# Patient Record
Sex: Female | Born: 1965 | Race: Black or African American | Hispanic: No | Marital: Single | Smoking: Never smoker
Health system: Southern US, Community
[De-identification: ages and names within clinical notes are randomized; demographics above are authoritative.]

## PROBLEM LIST (undated history)

## (undated) DIAGNOSIS — D219 Benign neoplasm of connective and other soft tissue, unspecified: Secondary | ICD-10-CM

## (undated) DIAGNOSIS — I10 Essential (primary) hypertension: Secondary | ICD-10-CM

## (undated) DIAGNOSIS — R519 Headache, unspecified: Secondary | ICD-10-CM

## (undated) DIAGNOSIS — K219 Gastro-esophageal reflux disease without esophagitis: Secondary | ICD-10-CM

## (undated) DIAGNOSIS — F319 Bipolar disorder, unspecified: Secondary | ICD-10-CM

## (undated) DIAGNOSIS — D649 Anemia, unspecified: Secondary | ICD-10-CM

## (undated) DIAGNOSIS — L309 Dermatitis, unspecified: Secondary | ICD-10-CM

## (undated) DIAGNOSIS — I729 Aneurysm of unspecified site: Secondary | ICD-10-CM

## (undated) DIAGNOSIS — G4700 Insomnia, unspecified: Secondary | ICD-10-CM

## (undated) DIAGNOSIS — G4730 Sleep apnea, unspecified: Secondary | ICD-10-CM

## (undated) HISTORY — PX: COLONOSCOPY: SHX174

## (undated) HISTORY — PX: UTERINE FIBROID EMBOLIZATION: SHX825

## (undated) HISTORY — PX: MYOMECTOMY: SHX85

## (undated) HISTORY — DX: Benign neoplasm of connective and other soft tissue, unspecified: D21.9

## (undated) HISTORY — DX: Essential (primary) hypertension: I10

## (undated) HISTORY — DX: Aneurysm of unspecified site: I72.9

## (undated) HISTORY — DX: Dermatitis, unspecified: L30.9

## (undated) HISTORY — DX: Headache, unspecified: R51.9

## (undated) HISTORY — DX: Gastro-esophageal reflux disease without esophagitis: K21.9

## (undated) HISTORY — DX: Insomnia, unspecified: G47.00

## (undated) HISTORY — DX: Bipolar disorder, unspecified: F31.9

---

## 2002-09-09 ENCOUNTER — Emergency Department: Admit: 2002-09-09 | Payer: Self-pay | Source: Emergency Department | Admitting: Emergency Medicine

## 2011-07-06 ENCOUNTER — Ambulatory Visit (INDEPENDENT_AMBULATORY_CARE_PROVIDER_SITE_OTHER): Payer: Medicare Other

## 2011-07-06 DIAGNOSIS — Z111 Encounter for screening for respiratory tuberculosis: Secondary | ICD-10-CM

## 2011-07-06 NOTE — Progress Notes (Signed)
PPD Placement note  Christina Torres, 45 y.o. female is here today for placement of PPD test  Reason for PPD test: Job she is applying for requires.  Pt taken PPD test before: yes  Verified in allergy area and with patient that they are not allergic to the products PPD is made of (Phenol or Tween). Yes4  Has the patient ever received the BCG vaccine?: no  O: Alert and oriented in NAD.  P:  PPD placed on 07/06/2011.  Patient advised to return for reading within 48-72 hours.

## 2011-07-08 ENCOUNTER — Ambulatory Visit (INDEPENDENT_AMBULATORY_CARE_PROVIDER_SITE_OTHER): Payer: Medicare Other

## 2011-07-08 DIAGNOSIS — Z111 Encounter for screening for respiratory tuberculosis: Secondary | ICD-10-CM

## 2011-07-08 LAB — TB SKIN TEST
Induration: 0
TB Skin Test: NEGATIVE mm

## 2011-07-08 NOTE — Progress Notes (Signed)
PPD Reading Note  PPD read and results entered in Epic[employer].  Result: 0 mm induration.  Interpretation: negative  Allergic reaction: no

## 2014-04-30 ENCOUNTER — Encounter (INDEPENDENT_AMBULATORY_CARE_PROVIDER_SITE_OTHER): Payer: Self-pay | Admitting: Internal Medicine

## 2014-04-30 ENCOUNTER — Ambulatory Visit (INDEPENDENT_AMBULATORY_CARE_PROVIDER_SITE_OTHER): Payer: Medicare Other | Admitting: Internal Medicine

## 2014-04-30 VITALS — BP 137/85 | HR 97 | Temp 98.9°F | Ht 63.98 in | Wt 180.8 lb

## 2014-04-30 DIAGNOSIS — F319 Bipolar disorder, unspecified: Secondary | ICD-10-CM

## 2014-04-30 DIAGNOSIS — I10 Essential (primary) hypertension: Secondary | ICD-10-CM | POA: Insufficient documentation

## 2014-04-30 DIAGNOSIS — Z1331 Encounter for screening for depression: Secondary | ICD-10-CM

## 2014-04-30 DIAGNOSIS — D500 Iron deficiency anemia secondary to blood loss (chronic): Secondary | ICD-10-CM | POA: Insufficient documentation

## 2014-04-30 NOTE — Patient Instructions (Addendum)
Controlling High Blood Pressure  High blood pressure (hypertension) is called the silent killer. This is because many people who have it don't know it. Normal blood pressure is less than 120/80. Know your blood pressure and remember to check it regularly. Doing so can save your life. Here are some things you can do to help control your blood pressure.    Choose heart-healthy foods   Select low-salt, low-fat foods.   Limit canned, dried, cured, packaged, and fast foods. These can contain a lot of salt.   Eat 8-10 servings of fruits and vegetables every day.   Choose lean meats, fish, or chicken.   Eat whole-grain pasta, brown rice, and beans.   Eat 2-3 servings of low-fat or fat-free dairy products   Ask your doctor about the DASH eating plan. This plan helps reduce blood pressure.  Maintain a healthy weight   Ask your healthcare provider how many calories to eat a day. Then stick to that number.   Ask your healthcare provider what weight range is healthiest for you. If you are overweight, weight loss of only 10 lbs can help lower blood pressure.   Limit snacks and sweets.   Get regular exercise.  Get up and get active   Choose activities you enjoy. Find ones you can do with friends or family.   Park farther away from building entrances.   Use stairs instead of the elevator.   When you can, walk or bike instead of driving.   Rake leaves, garden, or do household repairs.   Be active for at least 30 minutes a day, most days of the week.  Manage stress   Make time to relax and enjoy life. Find time to laugh.   Visit with family and friends, and keep up with hobbies.  Limit alcohol and quit smoking   Men: Have no more than 2 drinks per day.   Women: Have no more than 1 drink per day.   Talk with your healthcare provider about quitting smoking. Smoking increases your risk for heart disease and stroke. Ask about local or community programs that can help.  Medications  If lifestyle changes aren't  enough, your healthcare provider may prescribe high blood pressure medicine. Take all medications as prescribed.    2000-2014 [employer] [address], [place] [address]. All rights reserved. This information is not intended as a substitute for professional medical care. Always follow your healthcare professional's instructions.

## 2014-04-30 NOTE — Progress Notes (Signed)
Subjective:      Patient ID: Christina Torres is a 48 y.o. female  Chief Complaint   Patient presents with   . Establish Care   . Hypertension        HPI  Problem   Essential Hypertension    Accidentally threw away bp medication. During that time went to [facility] for 7 pills. Before that went to ER and bp was high and HCTZ to 25mg . Today took 12.5mg . Sometimes checks it at home. Last checked it and it was 200/100 and that's what took her to the ER. Didn't take it since then.   Feels well currently.   Diagnosed 2.5 yrs ago. At the time said it was mild. Does have family hx of HTN. Has 2 pills left.      Bipolar 1 Disorder    Sees psych Dr. Theda Belfast, [facility] who manages medication. Trileptal 600 qam, 900 qpm, Seroquel qpm, Sapris qhs prn sleep.       Iron Deficiency Anemia Due to Chronic Blood Loss    On iron supplements. Has hx of fibroids. Always is low.        The following portions of the patient's history were reviewed and updated as appropriate: current medications, allergies, past family history, past medical history, past social history, past surgical history and problem list.     Review of Systems   Constitutional: Negative.    Respiratory: Negative.    Cardiovascular: Negative.         BP 150/82 mmHg  Pulse 116  Temp(Src) 98.9 F (37.2 C) (Oral)  Ht 1.625 m (5' 3.98")  Wt 82.01 kg (180 lb 12.8 oz)  BMI 31.06 kg/m2  LMP 04/16/2014 (Approximate)   Objective:   Physical Exam   Constitutional: She is oriented to person, place, and time. She appears well-developed and well-nourished. No distress.   Eyes: Conjunctivae are normal.   Cardiovascular: Normal rate, regular rhythm and normal heart sounds.    Pulmonary/Chest: Effort normal and breath sounds normal.   Neurological: She is alert and oriented to person, place, and time.   Psychiatric: She has a normal mood and affect.   Nursing note and vitals reviewed.      Assessment:     1. Essential hypertension     2. Bipolar 1 disorder      3. Screening for depression     4. Iron deficiency anemia due to chronic blood loss           Plan:     48 y.o. female presents for the following:    1. HTN: usually well controlled  -continue HCTZ  -care plan letter at next visit    2. Bipolar 1: well controlled  -continue current meds   -follow up with psych as scheduled    3. Iron deficiency anemia  -check CBC at next visit    Return in about 4 weeks (around 05/28/2014) for follow up bp.     Gust Brooms, MD

## 2014-05-02 NOTE — Assessment & Plan Note (Signed)
Relevant Hx:  Course:  Daily Update:  Today's Plan:

## 2014-06-02 ENCOUNTER — Ambulatory Visit (INDEPENDENT_AMBULATORY_CARE_PROVIDER_SITE_OTHER): Payer: Medicare Other | Admitting: Internal Medicine

## 2014-06-02 ENCOUNTER — Encounter (INDEPENDENT_AMBULATORY_CARE_PROVIDER_SITE_OTHER): Payer: Self-pay | Admitting: Internal Medicine

## 2014-06-02 VITALS — BP 138/84 | HR 105 | Temp 98.8°F | Ht 64.49 in | Wt 183.0 lb

## 2014-06-02 DIAGNOSIS — Z1239 Encounter for other screening for malignant neoplasm of breast: Secondary | ICD-10-CM

## 2014-06-02 DIAGNOSIS — Z23 Encounter for immunization: Secondary | ICD-10-CM

## 2014-06-02 DIAGNOSIS — D500 Iron deficiency anemia secondary to blood loss (chronic): Secondary | ICD-10-CM

## 2014-06-02 DIAGNOSIS — I10 Essential (primary) hypertension: Secondary | ICD-10-CM

## 2014-06-02 DIAGNOSIS — Z1211 Encounter for screening for malignant neoplasm of colon: Secondary | ICD-10-CM

## 2014-06-02 NOTE — Progress Notes (Signed)
1. Have you self referred yourself since we last saw you? No  Refer to care team   Or   Add specialists:

## 2014-06-02 NOTE — Progress Notes (Signed)
Subjective:      Patient ID: Christina Torres is a 48 y.o. female  Chief Complaint   Patient presents with   . Hypertension     Patient would like referral for mammo and colonoscopy.        Hypertension      Problem   Essential Hypertension    Diagnosed 2.5 yrs ago. At the time said it was mild. Does have family hx of HTN. Taking HCTZ regularly, no concerns. Hasn't check bp at home recently. Feels well currently. No concerns except bp is a little high today in the office.     BP Readings from Last 3 Encounters:   06/02/14 146/83   04/30/14 137/85            Iron Deficiency Anemia Due to Chronic Blood Loss    On iron supplements. Has hx of fibroids. Always is low. Does not wish to check labs today.          The following portions of the patient's history were reviewed and updated as appropriate: current medications, allergies, past family history, past medical history, past social history, past surgical history and problem list.     Review of Systems   Constitutional: Negative.    Respiratory: Negative.    Cardiovascular: Negative.         BP 146/83-->138/84 mmHg  Pulse 101  Temp(Src) 98.8 F (37.1 C) (Oral)  Ht 1.638 m (5' 4.49")  Wt 83.008 kg (183 lb)  BMI 30.94 kg/m2  SpO2 98%  LMP 05/06/2014   Objective:   Physical Exam   Constitutional: She is oriented to person, place, and time. She appears well-developed and well-nourished. No distress.   Eyes: Conjunctivae are normal.   Cardiovascular: Normal rate, regular rhythm and normal heart sounds.    Pulmonary/Chest: Effort normal and breath sounds normal.   Neurological: She is alert and oriented to person, place, and time.   Psychiatric: She has a normal mood and affect.   Nursing note and vitals reviewed.      Assessment:     1. Essential hypertension     2. Breast cancer screening  Mammo Digital Screening Bilateral W Cad   3. Special screening for malignant neoplasms, colon  Ambulatory referral to Gastroenterology   4. Need for influenza vaccination  Flu vaccine  QUAD 3 yrs & greater PRESERVED IM   5. Iron deficiency anemia due to chronic blood loss           Plan:     48 y.o. female presents for the following:    1. HTN: usually well controlled, improved on rechecks  -continue HCTZ  -care plan letter at next visit  -check bp at home and keep log, bring log and cuff to next visit.     2. Bipolar 1: well controlled  -continue current meds   -follow up with psych as scheduled    3. Iron deficiency anemia  -check CBC at next visit    Health maintenance:  -Fasting blood work: at next visit  -Immunizations: flu today, discuss tdap at future bisit  -STD screen: discuss at future visit  -Pap: UTD, due 2018  -Mammogram:ordered  -DEXA: due at 50 yo  -Colonoscopy: referral given  -Vision: discuss at future visit  -Dental: discuss at future visit  -Advanced directive: discuss at future visit    Return in about 3 months (around 09/02/2014) for follow up bp.     Gust Brooms, MD

## 2014-06-16 ENCOUNTER — Ambulatory Visit: Payer: Medicare Other

## 2014-06-16 ENCOUNTER — Ambulatory Visit: Payer: Medicare Other | Attending: Internal Medicine

## 2014-06-16 ENCOUNTER — Other Ambulatory Visit (INDEPENDENT_AMBULATORY_CARE_PROVIDER_SITE_OTHER): Payer: Self-pay | Admitting: Internal Medicine

## 2014-06-16 DIAGNOSIS — Z1239 Encounter for other screening for malignant neoplasm of breast: Secondary | ICD-10-CM

## 2014-06-16 DIAGNOSIS — Z1231 Encounter for screening mammogram for malignant neoplasm of breast: Secondary | ICD-10-CM | POA: Insufficient documentation

## 2014-10-06 ENCOUNTER — Telehealth (INDEPENDENT_AMBULATORY_CARE_PROVIDER_SITE_OTHER): Payer: Self-pay | Admitting: Internal Medicine

## 2014-10-06 DIAGNOSIS — Z1211 Encounter for screening for malignant neoplasm of colon: Secondary | ICD-10-CM

## 2014-10-06 NOTE — Telephone Encounter (Signed)
Order placed as requested.

## 2014-10-06 NOTE — Telephone Encounter (Signed)
-----   Message from Chinita Greenland sent at 10/06/2014  8:49 AM EST -----  Regarding: Gastro referral   Good Morning-    Patient has current referral for [facility] for colonoscopy.  Patient called because that location does not accept her insurance, and would like a generic referral for colonoscopy so she can take it to a new location.     Thanks!  - Randolph Bing

## 2014-10-09 ENCOUNTER — Encounter (INDEPENDENT_AMBULATORY_CARE_PROVIDER_SITE_OTHER): Payer: Self-pay | Admitting: Internal Medicine

## 2014-10-09 ENCOUNTER — Ambulatory Visit (INDEPENDENT_AMBULATORY_CARE_PROVIDER_SITE_OTHER): Payer: No Typology Code available for payment source | Admitting: Internal Medicine

## 2014-10-09 VITALS — BP 110/69 | HR 92 | Temp 98.2°F | Ht 64.0 in | Wt 186.0 lb

## 2014-10-09 DIAGNOSIS — R110 Nausea: Secondary | ICD-10-CM

## 2014-10-09 MED ORDER — ONDANSETRON 4 MG PO TBDP
4.0000 mg | ORAL_TABLET | Freq: Three times a day (TID) | ORAL | Status: DC | PRN
Start: 2014-10-09 — End: 2015-03-13

## 2014-10-09 NOTE — Patient Instructions (Signed)
Viral Gastroenteritis (Adult)    Gastroenteritis is another name for the stomach flu. It is most often caused by a virus that affects the stomach and intestinal tract and usually lasts from 2 to 7 days.  The danger from repeated vomiting or diarrhea is dehydration. This is the loss of too many fluids from the body. When this occurs, body fluids must be replaced. Antibiotics do not help with this illness, but simple home treatment will be helpful.  Symptoms of viral gastroenteritis may include:   Watery, loose stools   Stomach pain or abdominal cramps   Fever and chills   Nausea and vomiting   Loss of bowel control   Headache  Home care  Follow these guidelines when caring for yourself at home:   If symptoms are severe, rest at home for the next 24 hours or until you are feeling better.   Washing your hands with soap and water is the best way to prevent the spread of infection. Wash your hands after touching anyone who is sick.   Wash your hands after using the toilet and before meals. Clean the toilet after each use.  Remember these tips when preparing food:   People with diarrhea should not prepare food for others. When preparing foods, wash your hands before and after.   Wash your hands after using cutting boards, countertops, and knives that have been in contact with raw food.   Keep uncooked meats away from cooked and ready-to-eat foods.  Medications  You may use acetaminophen or ibuprofen to control fever unless another medicine was given. If you have chronic liver or kidney disease, talk with your health care provider before using these medicines. Also talk with your provider if you'vehad a stomach ulcer or GI bleeding. Don't give aspirinto anyone under 18 years of age who is ill with a fever. It may cause severe liver damage.  Diet  Follow these guidelines forfood:   Water and liquids are important so you don't get dehydrated. Drink a small amount at a time or suck on ice chips if vomiting  is present.   If you eat, avoid fatty, greasy, spicy, or fried foods.   Don't eat dairy if you have diarrhea. This can make diarrhea worse.  During the first 24 hours (the first full day), follow the diet below:   Beverages. Sports drinks, soft drinks without caffeine, ginger ale, mineral water (plain or flavored), decaffeinated tea and coffee.   Soups. Clear broth, consomm, and bouillon.   Desserts. Gelatin, popsicles, and fruit juice bars.  During the next 24 hours (the second day), you may add the following to the above:   Hot cereal, plain toast, bread, rolls, and crackers   Plain noodles, rice, mashed potatoes, chicken noodle or rice soup   Unsweetened canned fruit (avoid pineapple), bananas   Limit fat intake to less than 15 grams per day. Do this by avoiding margarine, butter, oils, mayonnaise, sauces, gravies, fried foods, peanut butter, meat, poultry, and fish.   Limit fiber and avoid raw or cooked vegetables, fresh fruits (except bananas), and bran cereals.   Limit caffeine and chocolate. Don't use spices or seasonings other than salt.  During the next 24 hours:   Gradually resume a normal diet as you feel better and your symptoms improve.   If at any time it starts getting worse again, go back to clear liquids until you feel better.  Follow-up care  Follow up with your health care provider, oras advised. Call   your provider if you don't get better within 24 hours or if diarrhea lasts more than a week. If a stool (diarrhea) sample was taken, call as directed for the results.  Call 911  Call 911 if any of these occur:   Trouble breathing   Confused   Severe drowsiness or trouble awakening   Fainting or loss of consciousness   Rapid heart rate   Seizure   Stiff neck  When to seek medical advice  Call your health care provider right away if any of these occur:   Abdominal pain that gets worse   Continued vomiting (unable to keep liquids down)   Frequent diarrhea (more than 5 times a  day)   Blood in vomit or stool (black or red color)   Dark urine, reduced urine output, or extreme thirst   Weakness or dizziness   Drowsiness   Fever of 100.4F (38C) oral or higher that does not get better with fever medication   New rash   2000-2015 [employer]. [address], [place] [address]. All rights reserved. This information is not intended as a substitute for professional medical care. Always follow your healthcare professional's instructions.

## 2014-10-09 NOTE — Progress Notes (Signed)
Subjective:      Patient ID: Christina Torres is a 49 y.o. female  Chief Complaint   Patient presents with   . Nausea     felt weakness like she wsa sick   . Headache        Emesis   This is a new problem. The current episode started yesterday. The problem has been unchanged. There has been no fever. Pertinent negatives include no abdominal pain, coughing, diarrhea, fever or URI. She has tried nothing for the symptoms.     Yesterday had a headache that lasted 1-2 hours and went away. Last night around 1045 had   Then body started aching and felt sick to her stomach. Then today still feeling that way and feeling weak. No more headache since yesterday. Maybe is getting one now. Yesterday's was different than the ones she gets when she eats poorly. No vomiting.   Thought she was getting a sore throat this morning, but it went away.   No known sick contacts. Ate out at [facility] and had wings last night and second day she ate there.   Started period today, but never feels like this when she has a period so doesn't think it's related.     The following portions of the patient's history were reviewed and updated as appropriate: current medications, allergies, past family history, past medical history, past social history, past surgical history and problem list.     Review of Systems   Constitutional: Positive for fatigue. Negative for fever.   HENT: Positive for sore throat. Negative for congestion and rhinorrhea.    Respiratory: Negative for cough.    Gastrointestinal: Positive for nausea. Negative for vomiting, abdominal pain, diarrhea and constipation.   Genitourinary: Negative for dysuria.   Neurological: Positive for weakness (generalized today, yesterday felt strong and energetic).        BP 110/69 mmHg  Pulse 92  Temp(Src) 98.2 F (36.8 C)  Ht 1.626 m (5\' 4" )  Wt 84.369 kg (186 lb)  BMI 31.91 kg/m2  SpO2 97%   Objective:   Physical Exam   Constitutional: She is oriented to person, place, and time. She appears  well-developed and well-nourished. No distress.   HENT:   Head: Normocephalic and atraumatic.   Right Ear: Tympanic membrane normal.   Left Ear: Tympanic membrane normal.   Nose: Mucosal edema present. No rhinorrhea.   Mouth/Throat: Oropharynx is clear and moist and mucous membranes are normal.   Eyes: Conjunctivae are normal.   Neck: Neck supple.   Cardiovascular: Normal rate, regular rhythm and normal heart sounds.    Pulmonary/Chest: Effort normal and breath sounds normal.   Abdominal: Soft. Bowel sounds are normal. She exhibits no distension and no mass. There is no tenderness. There is no rebound and no guarding.   Lymphadenopathy:     She has no cervical adenopathy.   Neurological: She is alert and oriented to person, place, and time.   Psychiatric: She has a normal mood and affect.   Nursing note and vitals reviewed.      Assessment:     1. Nausea  ondansetron (ZOFRAN ODT) 4 MG disintegrating tablet         Plan:     49 y.o. female presents with fatigue, nausea, and body aches. No fever or URI symptoms at this time. Unlikely to be influenza. Could be viral gastroenteritis vs URI.   -zofran prn nausea  -supportive care, encourage fluids  -Risk & Benefits of the  new medication(s) were explained to the patient (and family) who verbalized understanding & agreed to the treatment plan. Patient (family) encouraged to contact me/clinical staff with any questions/concerns  -counseled on signs/symptoms to warrant re-evaluation     Return if symptoms worsen or fail to improve.     Gust Brooms, MD

## 2014-10-09 NOTE — Progress Notes (Signed)
1. Have you self referred yourself since we last saw you? no  Refer to care team   Or   Add specialists:

## 2014-10-10 ENCOUNTER — Encounter (INDEPENDENT_AMBULATORY_CARE_PROVIDER_SITE_OTHER): Payer: Self-pay | Admitting: Internal Medicine

## 2015-03-13 ENCOUNTER — Other Ambulatory Visit (INDEPENDENT_AMBULATORY_CARE_PROVIDER_SITE_OTHER): Payer: Self-pay

## 2015-03-13 ENCOUNTER — Encounter (INDEPENDENT_AMBULATORY_CARE_PROVIDER_SITE_OTHER): Payer: Self-pay | Admitting: Family Medicine

## 2015-03-13 ENCOUNTER — Ambulatory Visit (INDEPENDENT_AMBULATORY_CARE_PROVIDER_SITE_OTHER): Payer: Medicare Other | Admitting: Family Medicine

## 2015-03-13 VITALS — BP 131/85 | HR 84 | Temp 98.9°F | Resp 12 | Ht 64.17 in | Wt 178.2 lb

## 2015-03-13 DIAGNOSIS — I10 Essential (primary) hypertension: Secondary | ICD-10-CM

## 2015-03-13 MED ORDER — HYDROCHLOROTHIAZIDE 12.5 MG PO CAPS
12.5000 mg | ORAL_CAPSULE | Freq: Every morning | ORAL | Status: DC
Start: 2015-03-13 — End: 2015-05-07

## 2015-03-13 NOTE — Telephone Encounter (Signed)
Called [phone]. Pt has been informed of rx approval.

## 2015-03-13 NOTE — Progress Notes (Signed)
Has the patient sought any care outside of the [facility]? No   Refer to Care Team? No     Colonoscopy due- pt aware.   Scheduled for 03/18/15

## 2015-03-13 NOTE — Progress Notes (Signed)
Subjective:      Patient ID: Christina Torres  is a 49 y.o.  female.     Chief Complaint   Patient presents with   . Hypertension     BP on Wed 7/20 was 237/117; went to ED.  no change in med.  This morning it was 210/110      HPI  Patient seen at local ED 2 days ago after home blood pressure was "237/117".  States BP was in the 140 systolic range when she arrived there.  States labs and EKG were normal.  No change in prescription medication made.  Home BP check this am 210/110.  To goal here this afternoon.  Continues on once daily hydrochlorothiazide 12.5 mg.    Problem   Essential Hypertension    Diagnosed around 2013. At the time said it was mild. Does have family hx of HTN.   Started on and continues 12.5mg  HCTZ qd.  Taking daily/regularly, no concerns/side effects.   Only recent home BP checks.  No records for review.   Tries to watch salt, not exercising, not following formal diet.   BP Readings from Last 3 Encounters:   03/13/15 131/85   10/09/14 110/69   06/02/14 138/84                 The following portions of the patient's history were reviewed and updated as appropriate: current medications, allergies, past family history, past medical history, past social history, past surgical history and problem list.       Review of Systems   Constitutional: Negative for fatigue.   Respiratory: Negative for cough and shortness of breath.    Cardiovascular: Negative for chest pain, palpitations and leg swelling.   Gastrointestinal: Negative for abdominal pain.   Neurological: Negative for dizziness and headaches.          BP 131/85 mmHg  Pulse 84  Temp(Src) 98.9 F (37.2 C) (Oral)  Resp 12  Ht 1.63 m (5' 4.17")  Wt 80.831 kg (178 lb 3.2 oz)  BMI 30.42 kg/m2  SpO2 98%  LMP 03/06/2015    Objective:   Physical Exam   Constitutional: She appears well-developed and well-nourished.   Cardiovascular: Normal heart sounds.    Pulmonary/Chest: Breath sounds normal.   Musculoskeletal: She exhibits no edema.   Psychiatric: She  has a normal mood and affect.   Vitals reviewed.         Assessment:     1. Essential hypertension      -controlled        Plan:   There may be a possible disconnect between home blood pressure monitor and what she had in the local ED or here today.  Recommend continuing current hydrochlorothiazide 12.5 mg once daily.  Daily home blood pressure checks and record.  Get back on track with healthy diet, exercise, low salt.  Follow-up with PCP in 2-4 weeks to recheck blood pressure.  Risk & Benefits of any previous or new medication(s) were explained to the patient who verbalized understanding & agreed to the treatment plan.     Call with updates/questions/concerns.    Madlyn Frankel Raeanna Soberanes, D.O.

## 2015-03-13 NOTE — Telephone Encounter (Signed)
-----   Message from Frances Maywood sent at 03/13/2015  3:52 PM EDT -----  Contact: [phone]  Patient forgot to ask Dr. Franklyn Lor for refill of hydrochlorothiazide (MICROZIDE) 12.5 MG capsule called in to [facility](in file)    Please call her when this has been sent to pharmacy

## 2015-03-20 ENCOUNTER — Inpatient Hospital Stay (INDEPENDENT_AMBULATORY_CARE_PROVIDER_SITE_OTHER): Payer: No Typology Code available for payment source | Admitting: Internal Medicine

## 2015-03-27 ENCOUNTER — Encounter (INDEPENDENT_AMBULATORY_CARE_PROVIDER_SITE_OTHER): Payer: Self-pay | Admitting: Family Medicine

## 2015-03-27 ENCOUNTER — Ambulatory Visit (INDEPENDENT_AMBULATORY_CARE_PROVIDER_SITE_OTHER): Payer: Medicare Other | Admitting: Family Medicine

## 2015-03-27 VITALS — BP 134/68 | HR 99 | Temp 98.5°F | Ht 63.78 in | Wt 179.4 lb

## 2015-03-27 DIAGNOSIS — I10 Essential (primary) hypertension: Secondary | ICD-10-CM

## 2015-03-27 NOTE — Progress Notes (Signed)
Subjective:      Patient ID: Christina Torres  is a 49 y.o.  female.     Chief Complaint   Patient presents with   . Hypertension     f/u      HPI  2 week recheck.  There was no adjustment made to her Rx HTN regimen at that time.  Patient had been seen at local ED 2 days prior to that visit after home blood pressure was "237/117".  States BP was in the 140 systolic range when she arrived there.  Stated labs and EKG were normal.  No change in prescription medication made there.    Brought in her BP cuff (wrist monitor), she has not been placing correctly on arm (upside down).      Problem   Essential Hypertension    Diagnosed around 2013. At the time said it was mild. Does have family hx of HTN.   Previous PCP started on and continues 12.5mg  HCTZ qd.  Taking daily/regularly, no concerns/side effects.   Only recently doing home BP checks.  No records for review.   Tries to watch salt, not exercising, not following formal diet.   BP Readings from Last 3 Encounters:   03/27/15 134/68   03/13/15 131/85   10/09/14 110/69                 The following portions of the patient's history were reviewed and updated as appropriate: current medications, allergies, past family history, past medical history, past social history, past surgical history and problem list.       Review of Systems   Constitutional: Negative for fatigue.   Respiratory: Negative for cough.    Cardiovascular: Negative for leg swelling.   Gastrointestinal: Negative for abdominal pain.   Neurological: Negative for dizziness.          BP 134/68 mmHg  Pulse 99  Temp(Src) 98.5 F (36.9 C) (Oral)  Ht 1.62 m (5' 3.78")  Wt 81.375 kg (179 lb 6.4 oz)  BMI 31.01 kg/m2  SpO2 98%  LMP 03/06/2015    Objective:   Physical Exam   Constitutional: She appears well-developed and well-nourished.   Vitals reviewed.         Assessment:     1. Essential hypertension      -controlled        Plan:   Checked home blood pressure monitor to ours; numbers similar  Reviewed proper  placement of cuff  Recommend continuing current hydrochlorothiazide 12.5 mg once daily.  Daily home blood pressure checks and record.  Get back on track with healthy diet, exercise, low salt.  Follow-up with PCP in 3 mos to recheck; FBW due.  Risk & Benefits of any previous or new medication(s) were explained to the patient who verbalized understanding & agreed to the treatment plan.     Call with updates/questions/concerns.    Madlyn Frankel Raymar Joiner, D.O.

## 2015-03-27 NOTE — Progress Notes (Signed)
Has the patient sought any care outside of the [facility]? No   Refer to Care Team? No

## 2015-05-07 ENCOUNTER — Encounter (INDEPENDENT_AMBULATORY_CARE_PROVIDER_SITE_OTHER): Payer: Self-pay | Admitting: Internal Medicine

## 2015-05-07 ENCOUNTER — Ambulatory Visit (INDEPENDENT_AMBULATORY_CARE_PROVIDER_SITE_OTHER): Payer: Medicare Other | Admitting: Internal Medicine

## 2015-05-07 VITALS — BP 149/89 | HR 90 | Temp 98.5°F | Resp 20 | Wt 178.2 lb

## 2015-05-07 DIAGNOSIS — I671 Cerebral aneurysm, nonruptured: Secondary | ICD-10-CM

## 2015-05-07 DIAGNOSIS — D259 Leiomyoma of uterus, unspecified: Secondary | ICD-10-CM | POA: Insufficient documentation

## 2015-05-07 DIAGNOSIS — I10 Essential (primary) hypertension: Secondary | ICD-10-CM

## 2015-05-07 DIAGNOSIS — Z23 Encounter for immunization: Secondary | ICD-10-CM

## 2015-05-07 MED ORDER — HYDROCHLOROTHIAZIDE 25 MG PO TABS
25.0000 mg | ORAL_TABLET | Freq: Every day | ORAL | Status: DC
Start: 2015-05-07 — End: 2015-10-21

## 2015-05-07 NOTE — Progress Notes (Signed)
Has the patient sought any care outside of the [facility]? Yes - OB/GYN (Dr. Robby Sermon)  Refer to Care Team? No    No changes to family, medical or surgical history per pt.    Flu Shot due.     Patient presented to the office for Fluzone administration.    Vaccine was given to Right deltoid.    No reaction was noted and patient left in good condition.

## 2015-05-07 NOTE — Progress Notes (Signed)
Subjective:      Patient ID: Christina Torres is a 49 y.o. female  Chief Complaint   Patient presents with   . Hypertension     f/u; has been high with readings Mon 148/88 and Tues 165/85   . Flu Vaccine        HPI  Problem   Fibroid Uterus    Plans for surgery. Needs preop. 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 Previously was told that she may need preop eval prior to further procedures.      Essential Hypertension    Diagnosed around 2013. At the time said it was mild. Does have family hx of HTN.   Previous PCP started on and continues 12.5mg  HCTZ qd.  Taking daily/regularly, no concerns/side effects.   Only recently doing home BP checks.  No records for review.   Tries to watch salt, not exercising, not following formal diet.     Saw psych and OB and bp was high then as well.     BP Readings from Last 3 Encounters:   05/07/15 150/88   03/27/15 134/68   03/13/15 131/85                 The following portions of the patient's history were reviewed and updated as appropriate: current medications, allergies, past family history, past medical history, past social history, past surgical history and problem list.     Review of Systems     BP 150/88 mmHg  Pulse 89  Temp(Src) 98.5 F (36.9 C) (Oral)  Resp 20  Wt 80.831 kg (178 lb 3.2 oz)  SpO2 98%  LMP 05/07/2015   Objective:   Physical Exam   Constitutional: She is oriented to person, place, and time. She appears well-developed and well-nourished. No distress.   Eyes: Conjunctivae are normal.   Cardiovascular: Normal rate, regular rhythm and normal heart sounds.    Pulmonary/Chest: Effort normal and breath sounds normal.   Neurological: She is alert and oriented to person, place, and time.   Psychiatric: She has a normal mood and affect.   Nursing note and vitals reviewed.      Assessment:     1. Essential hypertension with goal blood pressure less than 140/90  hydrochlorothiazide (HYDRODIURIL) 25 MG tablet   2. Brain aneurysm  Ambulatory referral to Neurology   3. Need  for influenza vaccination  Flu vacc QUAD PRES FREE 3 YRS & GREATER         Plan:     49 y.o. female presents for the following:    1. HTN: bps higher recently   -increase HCTZ to 25mg   -care plan letter due 04/2016  -check bp at home and keep log, bring log and cuff to next visit.     2. Bipolar 1: well controlled  -continue current meds   -follow up with psych as scheduled    3. Iron deficiency anemia  -check CBC at next visit    4. Fibroids: plans for surgery  -reports hx of brain aneurysms and needs clearance for surgery  -refer to neuro vs neurosurg    Health maintenance:  -Fasting blood work: at next visit  -Immunizations: flu today, discuss tdap at future visit  -STD screen: discuss at future visit  -Pap: UTD, due 2018  -Mammogram: UTD due 05/2015  -DEXA: due at 49 yo  -Colonoscopy: UTD, due 2026  -Vision: discuss at future visit  -Dental: discuss at future visit  -Advanced directive: discuss at  future visit    Return in about 4 weeks (around 06/04/2015) for follow up bp.     Gust Brooms, MD

## 2015-05-07 NOTE — Patient Instructions (Signed)
Controlling High Blood Pressure  High blood pressure (hypertension) is called the silent killer. This is because many people who have it don't know it. High blood pressure is 140/90 or higher.Know your blood pressure and remember to check it regularly. Doing so can save your life. Here are some things you can do to help control your blood pressure.    Choose heart-healthy foods   Select low-salt, low-fat foods.   Limit canned, dried, cured, packaged, and fast foods. These can contain a lot of salt.   Eat 8 to 10 servings of fruits and vegetables every day.   Choose lean meats, fish, or chicken.   Eat whole-grain pasta, brown rice, and beans.   Eat 2 to 3 servings of low-fat or fat-free dairy products   Ask your doctor about the DASH eating plan. This plan helps reduce blood pressure.  Maintain a healthy weight   Ask your health care provider how many calories to eat a day. Then stick to that number.   Ask your health care provider what weight range is healthiest for you. If you are overweight, a weight loss of only 3% to 5% of your body weightcan help lower blood pressure.   Limit snacks and sweets.   Get regular exercise.  Get up and get active   Choose activities you enjoy. Find ones you can do with friends or family.   Park farther away from building entrances.   Use stairs instead of the elevator.   When you can, walk or bike instead of driving.   Rake leaves, garden, or do household repairs.   Be active at a moderate to vigorous level of physical activity for at least 40 minutes for a minimum of 3 to 4 days a week.  Manage stress   Make time to relax and enjoy life. Find time to laugh.   Visit with family and friends, and keep up with hobbies.  Limit alcohol and quit smoking   Men should have no more than 2 drinks per day.   Women should have no more than 1 drink per day.   Talk with your health care provider about quitting smoking. Smoking increases your risk for heart disease and  stroke. Ask about local or community programs that can help.     2000-2015 [employer] [address], [place] [address]. All rights reserved. This information is not intended as a substitute for professional medical care. Always follow your healthcare professional's instructions.

## 2015-06-01 ENCOUNTER — Encounter (INDEPENDENT_AMBULATORY_CARE_PROVIDER_SITE_OTHER): Payer: Self-pay | Admitting: Internal Medicine

## 2015-06-05 ENCOUNTER — Ambulatory Visit: Payer: Medicare Other | Attending: Neurology | Admitting: Diagnostic Radiology

## 2015-06-05 ENCOUNTER — Encounter: Payer: Self-pay | Admitting: Diagnostic Radiology

## 2015-06-05 ENCOUNTER — Other Ambulatory Visit: Payer: Self-pay | Admitting: Diagnostic Radiology

## 2015-06-05 ENCOUNTER — Ambulatory Visit
Admission: RE | Admit: 2015-06-05 | Discharge: 2015-06-05 | Disposition: A | Discharge status: Home / Self Care | Payer: Self-pay | Source: Ambulatory Visit

## 2015-06-05 ENCOUNTER — Other Ambulatory Visit: Payer: Self-pay

## 2015-06-05 DIAGNOSIS — I671 Cerebral aneurysm, nonruptured: Secondary | ICD-10-CM | POA: Insufficient documentation

## 2015-06-05 DIAGNOSIS — Z7189 Other specified counseling: Secondary | ICD-10-CM | POA: Insufficient documentation

## 2015-06-05 DIAGNOSIS — I10 Essential (primary) hypertension: Secondary | ICD-10-CM | POA: Insufficient documentation

## 2015-06-05 NOTE — Progress Notes (Unsigned)
Date 06/05/2015    RE: Christina Torres, Christina Torres   DOB: July 01, 1966  [id number]    Dear Dr. Albertina Senegal,    As you know,  Ms. Christina Torres is a 49 year-old female with a history of hypertension, bipolar 1, fibroids, an intracranial aneurysms who presented with severe headache in 2010. 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 Angiogram performed on 07/28/2010 confirmed stability of these aneurysms. No treatment was offered as it is documented that the opinion of the treating physicians was that the headache was unrelated to the aneurysms.    The patient presents today for discussion of the management of these aneurysms. On physical examination, she is neurologically intact and doing well. I reviewed the imaging with the patient and discussed the natural history of unruptured cerebral aneurysms. I also reviewed the risks and benefits of cerebral angiography and endovascular treatment, including bleeding, infection, stroke, and even death.I recommend conservative management because I believe that the risks of treatment outweigh the natural history risk. Therefore, we will plan for follow CTA imaging to insure stability since it has been 5 years since the patient was last imaged.    I answered all questions and remain available to answer any questions that arise in the interim. Thank you for involving me in the care of this patient.    Warm personal regards,      Lewis Moccasin. Jimmie Molly, MD.  Interventional Neuroradiology/Neurointerventional Surgery  [phone]

## 2015-06-10 ENCOUNTER — Ambulatory Visit
Admission: RE | Admit: 2015-06-10 | Discharge: 2015-06-10 | Disposition: A | Discharge status: Home / Self Care | Payer: Medicare Other | Source: Ambulatory Visit | Attending: Diagnostic Radiology | Admitting: Diagnostic Radiology

## 2015-06-10 ENCOUNTER — Other Ambulatory Visit: Payer: Self-pay | Admitting: Diagnostic Radiology

## 2015-06-10 DIAGNOSIS — I671 Cerebral aneurysm, nonruptured: Secondary | ICD-10-CM

## 2015-06-10 LAB — I-STAT CREATININE: Creatinine I-Stat: 1 mg/dL (ref 0.6–1.5)

## 2015-06-11 ENCOUNTER — Encounter: Payer: Self-pay | Admitting: Diagnostic Radiology

## 2015-06-12 ENCOUNTER — Encounter (INDEPENDENT_AMBULATORY_CARE_PROVIDER_SITE_OTHER): Payer: Self-pay | Admitting: Internal Medicine

## 2015-06-19 ENCOUNTER — Encounter: Payer: Self-pay | Admitting: Diagnostic Radiology

## 2015-06-22 ENCOUNTER — Encounter (INDEPENDENT_AMBULATORY_CARE_PROVIDER_SITE_OTHER): Payer: Self-pay | Admitting: Internal Medicine

## 2015-06-25 ENCOUNTER — Encounter (INDEPENDENT_AMBULATORY_CARE_PROVIDER_SITE_OTHER): Payer: Self-pay | Admitting: Internal Medicine

## 2015-06-25 DIAGNOSIS — I671 Cerebral aneurysm, nonruptured: Secondary | ICD-10-CM | POA: Insufficient documentation

## 2015-06-29 ENCOUNTER — Ambulatory Visit (INDEPENDENT_AMBULATORY_CARE_PROVIDER_SITE_OTHER): Payer: Medicare Other | Admitting: Family Medicine

## 2015-06-29 ENCOUNTER — Encounter (INDEPENDENT_AMBULATORY_CARE_PROVIDER_SITE_OTHER): Payer: Self-pay | Admitting: Family Medicine

## 2015-06-29 ENCOUNTER — Encounter (INDEPENDENT_AMBULATORY_CARE_PROVIDER_SITE_OTHER): Payer: Self-pay | Admitting: Internal Medicine

## 2015-06-29 VITALS — BP 138/74 | HR 106 | Temp 98.8°F | Ht 64.37 in | Wt 185.0 lb

## 2015-06-29 DIAGNOSIS — I10 Essential (primary) hypertension: Secondary | ICD-10-CM

## 2015-06-29 DIAGNOSIS — Z833 Family history of diabetes mellitus: Secondary | ICD-10-CM

## 2015-06-29 DIAGNOSIS — D500 Iron deficiency anemia secondary to blood loss (chronic): Secondary | ICD-10-CM

## 2015-06-29 LAB — COMPREHENSIVE METABOLIC PANEL
ALT: 13 U/L (ref 0–55)
AST (SGOT): 14 U/L (ref 5–34)
Albumin/Globulin Ratio: 1.1 (ref 0.9–2.2)
Albumin: 3.7 g/dL (ref 3.5–5.0)
Alkaline Phosphatase: 86 U/L (ref 37–106)
BUN: 12 mg/dL (ref 7.0–19.0)
Bilirubin, Total: 0.2 mg/dL (ref 0.1–1.2)
CO2: 29 mEq/L (ref 21–30)
Calcium: 9.4 mg/dL (ref 8.5–10.5)
Chloride: 100 mEq/L (ref 100–111)
Creatinine: 0.8 mg/dL (ref 0.4–1.5)
Globulin: 3.5 g/dL (ref 2.0–3.7)
Glucose: 93 mg/dL (ref 70–100)
Potassium: 3.8 mEq/L (ref 3.5–5.3)
Protein, Total: 7.2 g/dL (ref 6.0–8.3)
Sodium: 138 mEq/L (ref 135–146)

## 2015-06-29 LAB — CBC
Hematocrit: 36.4 % — ABNORMAL LOW (ref 37.0–47.0)
Hgb: 11.6 g/dL — ABNORMAL LOW (ref 12.0–16.0)
MCH: 29 pg (ref 28.0–32.0)
MCHC: 31.9 g/dL — ABNORMAL LOW (ref 32.0–36.0)
MCV: 91 fL (ref 80.0–100.0)
MPV: 10.8 fL (ref 9.4–12.3)
Nucleated RBC: 0 /100 WBC (ref 0–1)
Platelets: 218 10*3/uL (ref 140–400)
RBC: 4 10*6/uL — ABNORMAL LOW (ref 4.20–5.40)
RDW: 13 % (ref 12–15)
WBC: 6.16 10*3/uL (ref 3.50–10.80)

## 2015-06-29 LAB — FERRITIN: Ferritin: 34.26 ng/mL (ref 4.60–204.00)

## 2015-06-29 LAB — HEMOGLOBIN A1C
Average Estimated Glucose: 114 mg/dL
Hemoglobin A1C: 5.6 % (ref 4.6–5.9)

## 2015-06-29 LAB — HEMOLYSIS INDEX: Hemolysis Index: 2 (ref 0–18)

## 2015-06-29 LAB — TSH: TSH: 1.02 u[IU]/mL (ref 0.35–4.94)

## 2015-06-29 LAB — IRON: Iron: 65 ug/dL (ref 40–145)

## 2015-06-29 LAB — GFR: EGFR: >60

## 2015-06-29 NOTE — Progress Notes (Signed)
Have you seen any new specialists/physicians since you were last here? Yes    Limb alert protocol reviewed?  Yes    Informed due for Mammo.

## 2015-06-29 NOTE — Progress Notes (Signed)
Subjective:      Patient ID: Christina Torres  is a 49 y.o.  female.     Chief Complaint   Patient presents with   . Hypertension      HPI  2 month recheck since adjustment made to her Rx HTN regimen (increased HCTZ to 25mg ).   Again brought in her BP cuff (wrist monitor) to compare to ours.  Recent readings (only a few) 130-140/80s.  Previously she had not been placing correctly on arm (was upside down) but has done correctly since (though initially holding wrist/BP cuff down at side).      Problem   Essential Hypertension    Diagnosed around 2013 by previous PCP. At the time said it was 'mild'. Does have family hx of HTN.   Previous PCP started pt on and continues HCTZ qd (was 12.5, increased to 25mg  04/2015).  Taking daily/regularly, no concerns/side effects.   Occasional home BP checks; brought wrist BP cuff for review.   Tries to watch salt, not exercising, not following formal diet.   BP Readings from Last 3 Encounters:   06/29/15 138/74   05/07/15 149/89   03/27/15 134/68            Iron Deficiency Anemia Due to Chronic Blood Loss    Not currently on iron supplements. Has hx of fibroids. Always is low per patient.  Agrees to check labs today.           The following portions of the patient's history were reviewed and updated as appropriate: current medications, allergies, past family history, past medical history, past social history, past surgical history and problem list.       Review of Systems   Constitutional: Negative for fatigue.   Respiratory: Negative for cough.    Cardiovascular: Negative for leg swelling.   Gastrointestinal: Negative for abdominal pain.   Neurological: Negative for dizziness.          BP 138/74 mmHg  Pulse 106  Temp(Src) 98.8 F (37.1 C) (Oral)  Ht 1.635 m (5' 4.37")  Wt 83.915 kg (185 lb)  BMI 31.39 kg/m2  SpO2 97%  LMP 06/28/2015    Objective:   Physical Exam   Constitutional: She appears well-developed and well-nourished.   Vitals reviewed.         Assessment:     1. Essential  hypertension   -decent control; to goal Comprehensive metabolic panel    TSH   2. Iron deficiency anemia due to chronic blood loss  CBC without differential    Ferritin    Iron   3. Family history of diabetes mellitus (DM)  Hemoglobin A1C           Plan:   Checked home blood pressure monitor to ours; numbers similar  Again reviewed proper placement of cuff/'hold close to heart'.  Recommend continuing current hydrochlorothiazide 25 mg once daily.  Daily home blood pressure checks and record.  Get back on track with healthy diet, exercise, low salt.  If blood pressure becomes above goal on average may change to 10 mg Ace with 12.5 mg hydrochlorothiazide  Check labs as above.  (Still due for screening lipid).    Risk & Benefits of any previous or new medication(s) were explained to the patient who verbalized understanding & agreed to the treatment plan.     Call with updates/questions/concerns.    Madlyn Frankel Blenda Wisecup, D.O.

## 2015-07-08 ENCOUNTER — Encounter (INDEPENDENT_AMBULATORY_CARE_PROVIDER_SITE_OTHER): Payer: Self-pay | Admitting: Internal Medicine

## 2015-07-08 ENCOUNTER — Other Ambulatory Visit: Payer: Self-pay | Admitting: Internal Medicine

## 2015-07-08 ENCOUNTER — Ambulatory Visit: Payer: Medicare Other | Attending: Internal Medicine

## 2015-07-08 ENCOUNTER — Ambulatory Visit (FREE_STANDING_LABORATORY_FACILITY): Payer: Medicare Other | Admitting: Internal Medicine

## 2015-07-08 VITALS — BP 135/82 | HR 93 | Temp 98.7°F | Resp 20 | Ht 64.17 in | Wt 185.6 lb

## 2015-07-08 DIAGNOSIS — D259 Leiomyoma of uterus, unspecified: Secondary | ICD-10-CM | POA: Insufficient documentation

## 2015-07-08 DIAGNOSIS — I10 Essential (primary) hypertension: Secondary | ICD-10-CM

## 2015-07-08 DIAGNOSIS — Z01818 Encounter for other preprocedural examination: Secondary | ICD-10-CM | POA: Insufficient documentation

## 2015-07-08 DIAGNOSIS — F319 Bipolar disorder, unspecified: Secondary | ICD-10-CM

## 2015-07-08 DIAGNOSIS — D500 Iron deficiency anemia secondary to blood loss (chronic): Secondary | ICD-10-CM

## 2015-07-08 LAB — HEMOLYSIS INDEX: Hemolysis Index: 4 (ref 0–18)

## 2015-07-08 LAB — CBC
Hematocrit: 33.3 % — ABNORMAL LOW (ref 37.0–47.0)
Hgb: 10.6 g/dL — ABNORMAL LOW (ref 12.0–16.0)
MCH: 29 pg (ref 28.0–32.0)
MCHC: 31.8 g/dL — ABNORMAL LOW (ref 32.0–36.0)
MCV: 91.2 fL (ref 80.0–100.0)
MPV: 10.5 fL (ref 9.4–12.3)
Nucleated RBC: 0 /100 WBC (ref 0–1)
Platelets: 216 10*3/uL (ref 140–400)
RBC: 3.65 10*6/uL — ABNORMAL LOW (ref 4.20–5.40)
RDW: 13 % (ref 12–15)
WBC: 5.4 10*3/uL (ref 3.50–10.80)

## 2015-07-08 LAB — HEPATITIS C ANTIBODY: Hepatitis C, AB: NONREACTIVE

## 2015-07-08 LAB — URINALYSIS WITH MICROSCOPIC
Bilirubin, UA: NEGATIVE
Blood, UA: NEGATIVE
Glucose, UA: NEGATIVE
Ketones UA: NEGATIVE
Nitrite, UA: NEGATIVE
Protein, UR: NEGATIVE
Specific Gravity UA: >1.035 — AB (ref 1.001–1.035)
Urine pH: 5.5 (ref 5.0–8.0)
Urobilinogen, UA: 0.2 (ref 0.2–2.0)

## 2015-07-08 LAB — BASIC METABOLIC PANEL
BUN: 12 mg/dL (ref 7.0–19.0)
CO2: 28 mEq/L (ref 21–30)
Calcium: 9.2 mg/dL (ref 8.5–10.5)
Chloride: 104 mEq/L (ref 100–111)
Creatinine: 0.9 mg/dL (ref 0.4–1.5)
Glucose: 64 mg/dL — ABNORMAL LOW (ref 70–100)
Potassium: 4.2 mEq/L (ref 3.5–5.3)
Sodium: 139 mEq/L (ref 135–146)

## 2015-07-08 LAB — GFR: EGFR: >60

## 2015-07-08 LAB — HEPATITIS B SURFACE ANTIBODY: HEPATITIS B SURFACE ANTIBODY: <8

## 2015-07-08 LAB — HIV AG/AB 4TH GENERATION: HIV Ag/Ab, 4th Generation: NONREACTIVE

## 2015-07-08 LAB — HCG QUANTITATIVE: hCG, Quant.: <1.2 (ref 0.0–4.9)

## 2015-07-08 NOTE — Progress Notes (Signed)
Subjective:      Patient ID: KESHONA KARTES is a 49 y.o. female  Chief Complaint   Patient presents with   . Pre-op Exam     surgery scheduled 07/28/15 with Dr. Maureen Chatters; removal of fibroid tumors        HPI    JUNNIE LOSCHIAVO  presents to the office today for a preoperative consultation at the request of surgeon (Dr. Aggie Cosier) who plans on performing a myomectomy on December 6.   Patient has a history of HTN, anemia, fibroid uterus, bipolar disorder.    Planned anesthesia: general.   The patient has the following known anesthesia issues: none. Patients bleeding risk: no recent abnormal bleeding.   Patient does not have objections to receiving blood products if needed.    The surgeon has requested the following labs/studies to be performed: EKG, CXR, Hep C, Hep B, RPR, HIV, CBC, BMP, UA, HCG.    Patient Active Problem List   Diagnosis   . Essential hypertension   . Bipolar 1 disorder   . Iron deficiency anemia due to chronic blood loss   . Fibroid uterus   . Cerebral aneurysm     Past Medical History   Diagnosis Date   . Hypertension    . Bipolar 1 disorder    . Fibroid tumor    . Aneurysm      Past Surgical History   Procedure Laterality Date   . Myomectomy     . Uterine fibroid embolization       Family History   Problem Relation Age of Onset   . Cancer Mother      colon   . Hypertension Mother    . Hypertension Father    . Diabetes Father    . Heart failure Father      Social History     Social History   . Marital Status: Single     Spouse Name: N/A   . Number of Children: N/A   . Years of Education: N/A     Occupational History   . Not on file.     Social History Main Topics   . Smoking status: Never Smoker    . Smokeless tobacco: Never Used   . Alcohol Use: No   . Drug Use: No   . Sexual Activity: Not on file     Other Topics Concern   . Not on file     Social History Narrative     Current Outpatient Prescriptions   Medication Sig Dispense Refill   . asenapine Maleate (SAPHRIS) 10 MG SL Tab Place 10 mg under the  tongue nightly.        . hydrochlorothiazide (HYDRODIURIL) 25 MG tablet Take 1 tablet (25 mg total) by mouth daily. 90 tablet 1   . OXcarbazepine (TRILEPTAL) 300 MG tablet Take 900 mg by mouth nightly.        . OXcarbazepine (TRILEPTAL) 600 MG tablet Take 600 mg by mouth every morning.        [name] QUEtiapine (SEROQUEL) 400 MG tablet Take 800 mg by mouth nightly.         No current facility-administered medications for this visit.     Allergies   Allergen Reactions   . Penicillins Angioedema   . Clindamycin Rash   . Lamictal [Lamotrigine] Rash        The following portions of the patient's history were reviewed and updated as appropriate: current medications, allergies, past family history, past  medical history, past social history, past surgical history and problem list.     Review of Systems   Constitutional: Negative.    Respiratory: Negative.    Cardiovascular: Negative.         BP 135/82 mmHg  Pulse 93  Temp(Src) 98.7 F (37.1 C) (Oral)  Resp 20  Ht 1.63 m (5' 4.17")  Wt 84.188 kg (185 lb 9.6 oz)  BMI 31.69 kg/m2  SpO2 98%  LMP 06/28/2015   Objective:   Physical Exam   Constitutional: She is oriented to person, place, and time. She appears well-developed and well-nourished. No distress.   HENT:   Mouth/Throat: Oropharynx is clear and moist.   Eyes: Conjunctivae are normal.   Neck: Neck supple.   Cardiovascular: Normal rate, regular rhythm, normal heart sounds and intact distal pulses.  Exam reveals no gallop and no friction rub.    No murmur heard.  Pulmonary/Chest: Effort normal and breath sounds normal.   Abdominal: Soft. Bowel sounds are normal. She exhibits no distension and no mass. There is no tenderness. There is no rebound and no guarding.   Musculoskeletal: She exhibits no edema.   Lymphadenopathy:     She has no cervical adenopathy.   Neurological: She is alert and oriented to person, place, and time.   Psychiatric: She has a normal mood and affect.   Nursing note and vitals reviewed.    EKG:  NSR, rate 86, normal axis, normal intervals, good Rwave progression, No ST changes, Qwaves, TWI   Assessment:     1. Preop examination  CBC without differential    Hepatitis C (HCV) antibody, Total    RPR    HIV Ag/Ab 4th generation    Hepatitis B core antibody, total    Hepatitis B (HBV) Surface Antigen    HCG Beta , Quant, Serum    Basic Metabolic Panel    Urinalysis with microscopic    Hepatitis B (HBV) Surface Antibody Quant    ECG 12 lead    XR Chest 2 Views   2. Uterine leiomyoma, unspecified location  CBC without differential    Hepatitis C (HCV) antibody, Total    RPR    HIV Ag/Ab 4th generation    Hepatitis B core antibody, total    Hepatitis B (HBV) Surface Antigen    HCG Beta , Quant, Serum    Basic Metabolic Panel    Urinalysis with microscopic    Hepatitis B (HBV) Surface Antibody Quant    ECG 12 lead    XR Chest 2 Views   3. Essential hypertension     4. Iron deficiency anemia due to chronic blood loss     5. Bipolar 1 disorder           Plan:     49 y.o. female presents for preop evaluation prior to fibroid myomectomy on Dec 6 with Dr. Robby Sermon at [facility].     1. Preop cardiac clearance: By 2007 ACC/AHA guidelines, pt has no risk factors (no history of CAD, CHF, CVA, DM, or CKD)  she is low risk for an intermediate risk procedure. Can proceed with planned surgery.  -METs: Pt is able to perform > or equal to 4 mets.   -B-blocker: not indicated at this time  -medication management: recommend stopping all supplements or nsaids including asa 7 days prior to procedure or as directed by surgeon. Can take meds the morning of the procedure with sips of water.   -Preop labs: ordered as above  -  EKG: NSR, normal rate, normal axis, normal intervals, normal R-wave progression, no ST-changes or TWI  -CXR: ordered as requested     Follow up after procedure as needed. Call with questions and updates.     Gust Brooms, MD

## 2015-07-08 NOTE — Progress Notes (Signed)
Have you seen any new specialists/physicians since you were last here? No          Limb alert protocol reviewed?  Yes      No changes to family, medical or surgical history per pt.      Mammo due. Pt notified.

## 2015-07-08 NOTE — Patient Instructions (Signed)
What Are Fibroids?  Fibroids aregrowthsmade up ofconnective tissue and muscle cells that usually formin the wall of your uterus.Other names for fibroids are myomas and leiomyomas.Fibroids are the most common tumor in women.They are almost always noncancerous (benign) and harmless. Fibroids start as pea-sized lumps, but can grow steadily during your reproductive years. Many fibroids just need to bewatched. Others mayneedtreatment if they become too large or cause symptoms.    Potential problems  Fibroids often cause no symptoms. But a fibroid that grows quickly in your uterus can cause 1 or more of the following problems:   Excessive uterine bleeding, leading to anemia (lack of red blood cells)   Frequent urge to urinate   Difficulty having bowel movements   Achiness, heaviness, or fullness   Back or abdominal pain   Pain during intercourse   Difficulty getting pregnant or being unable to get pregnant   Problems with pregnancy   Enlargement of the lower abdomen  Treatment is tailored for you  No2 fibroids are the same. The type of treatment you will have depends on their number, size, location, and rate of growth. Your treatment decision also depends on the severity of your symptoms and whether or not you plan to have children in the future. There are a growing number of effective ways to treat fibroids. After your medical evaluation, your health care providerwill be able to discuss with you the best options to solve your particular problem and meet your needs.     2000-2015 [employer] [address], [place] [address]. All rights reserved. This information is not intended as a substitute for professional medical care. Always follow your healthcare professional's instructions.

## 2015-07-09 LAB — RPR: RPR: NONREACTIVE

## 2015-07-10 ENCOUNTER — Encounter (INDEPENDENT_AMBULATORY_CARE_PROVIDER_SITE_OTHER): Payer: Self-pay | Admitting: Internal Medicine

## 2015-07-10 ENCOUNTER — Other Ambulatory Visit (INDEPENDENT_AMBULATORY_CARE_PROVIDER_SITE_OTHER): Payer: Self-pay | Admitting: Internal Medicine

## 2015-07-10 DIAGNOSIS — R8290 Unspecified abnormal findings in urine: Secondary | ICD-10-CM

## 2015-07-10 LAB — HEPATITIS B CORE ANTIBODY, TOTAL: Hepatitis B Core Total AB: NONREACTIVE

## 2015-07-14 ENCOUNTER — Ambulatory Visit: Payer: Medicare Other | Attending: Obstetrics and Gynecology

## 2015-07-14 NOTE — Pre-Procedure Instructions (Signed)
Patient had all pre-ops done with PCP. Will fax surgeon for results.Dondra Prader, MD Reassign   Phone: [phone]; Fax: [phone]   No orders to note.

## 2015-07-20 ENCOUNTER — Encounter (INDEPENDENT_AMBULATORY_CARE_PROVIDER_SITE_OTHER): Payer: Self-pay | Admitting: Internal Medicine

## 2015-07-23 ENCOUNTER — Other Ambulatory Visit (FREE_STANDING_LABORATORY_FACILITY): Payer: Medicare Other

## 2015-07-23 DIAGNOSIS — R8290 Unspecified abnormal findings in urine: Secondary | ICD-10-CM

## 2015-07-23 LAB — URINALYSIS WITH MICROSCOPIC
Bilirubin, UA: NEGATIVE
Blood, UA: NEGATIVE
Glucose, UA: NEGATIVE
Ketones UA: NEGATIVE
Nitrite, UA: NEGATIVE
Protein, UR: NEGATIVE
Specific Gravity UA: 1.026 (ref 1.001–1.035)
Urine pH: 6 (ref 5.0–8.0)
Urobilinogen, UA: 0.2 (ref 0.2–2.0)

## 2015-07-27 NOTE — H&P (Addendum)
ADMISSION HISTORY AND PHYSICAL EXAM    Date Time: 07/27/2015 3:47 PM  Patient Name: Christina Torres  Attending Physician: Dondra Prader, MD    Assessment:   49 y/o with fibroid uterus  Causing secondary infertility and Irregular bleeding   Plan:   Admit to [facility]   Robotic myomectomy   IV Gentamycin   ERAS protocol     History of Present Illness:   Christina Torres is a 49 y.o. female who presents to the hospital with Infertility and pelvic Pain and irregular menstruation due to fibroid uterus . Patient wishes to preserve her fertility even though she is 24 and not sexually active ( she believes in miracle she says . ) she has been counseled by DR Jalene Mullet extensively regarding Myomectomy VS hysterectomy and she elected a Myomectomy . i counseled her that after a [place] pregnancy is more difficult and also given she is not sexually active but she is insisting on preserving her fertility will proceed with Robotic Myomectomy     Past Medical History:     Past Medical History   Diagnosis Date   . Hypertension    . Bipolar 1 disorder    . Fibroid tumor    . Aneurysm      2 aneurysm each side of nose. monitored closely   . Headache    . Eczema    . Anemia      PMH       Past Surgical History:     Past Surgical History   Procedure Laterality Date   . Myomectomy     . Uterine fibroid embolization     . Colonoscopy         Family History:     Family History   Problem Relation Age of Onset   . Cancer Mother      colon   . Hypertension Mother    . Hypertension Father    . Diabetes Father    . Heart failure Father        Social History:     Social History     Social History   . Marital Status: Single     Spouse Name: N/A   . Number of Children: N/A   . Years of Education: N/A     Social History Main Topics   . Smoking status: Never Smoker    . Smokeless tobacco: Never Used   . Alcohol Use: No   . Drug Use: No   . Sexual Activity:     Partners: Male     Other Topics Concern   . Not on file     Social History Narrative       Allergies:      Allergies   Allergen Reactions   . Penicillins Angioedema   . Clindamycin Rash   . Lamictal [Lamotrigine] Rash       Medications:     No prescriptions prior to admission       Review of Systems:   A comprehensive review of systems was:All other systems were reviewed and are negative except as previously noted in the HPI    Physical Exam:   There were no vitals filed for this visit.    Intake and Output Summary (Last 24 hours) at Date Time  No intake or output data in the 24 hours ending 07/27/15 1547    PE:  AAO3 NAD   Chest : CTA b/L no w/r/r   Heart : RRR S1 S2  Abd : Soft NT ND   VE: Deferred for EUA     Labs:     Results     ** No results found for the last 24 hours. **              Rads:   Radiological Procedure reviewed.    Signed by: Dondra Prader            H&P Reviewed and up to date   Will proceed with planned procedure

## 2015-07-28 ENCOUNTER — Ambulatory Visit
Admission: RE | Admit: 2015-07-28 | Discharge: 2015-07-28 | Disposition: A | Discharge status: Home / Self Care | Payer: Medicare Other | Source: Intra-hospital | Attending: Obstetrics and Gynecology | Admitting: Obstetrics and Gynecology

## 2015-07-28 ENCOUNTER — Ambulatory Visit: Payer: Medicare Other | Admitting: Obstetrics and Gynecology

## 2015-07-28 ENCOUNTER — Other Ambulatory Visit: Payer: Self-pay

## 2015-07-28 ENCOUNTER — Ambulatory Visit: Payer: Medicare Other | Admitting: Certified Registered"

## 2015-07-28 ENCOUNTER — Ambulatory Visit: Payer: Self-pay

## 2015-07-28 ENCOUNTER — Encounter
Admission: RE | Disposition: A | Discharge status: Home / Self Care | Payer: Self-pay | Source: Intra-hospital | Attending: Obstetrics and Gynecology

## 2015-07-28 DIAGNOSIS — N920 Excessive and frequent menstruation with regular cycle: Secondary | ICD-10-CM | POA: Insufficient documentation

## 2015-07-28 DIAGNOSIS — I10 Essential (primary) hypertension: Secondary | ICD-10-CM | POA: Insufficient documentation

## 2015-07-28 DIAGNOSIS — N979 Female infertility, unspecified: Secondary | ICD-10-CM | POA: Insufficient documentation

## 2015-07-28 DIAGNOSIS — D649 Anemia, unspecified: Secondary | ICD-10-CM | POA: Insufficient documentation

## 2015-07-28 DIAGNOSIS — N736 Female pelvic peritoneal adhesions (postinfective): Secondary | ICD-10-CM | POA: Insufficient documentation

## 2015-07-28 DIAGNOSIS — N940 Mittelschmerz: Secondary | ICD-10-CM | POA: Insufficient documentation

## 2015-07-28 DIAGNOSIS — R51 Headache: Secondary | ICD-10-CM | POA: Insufficient documentation

## 2015-07-28 DIAGNOSIS — D259 Leiomyoma of uterus, unspecified: Secondary | ICD-10-CM | POA: Insufficient documentation

## 2015-07-28 DIAGNOSIS — L309 Dermatitis, unspecified: Secondary | ICD-10-CM | POA: Insufficient documentation

## 2015-07-28 HISTORY — DX: Anemia, unspecified: D64.9

## 2015-07-28 HISTORY — PX: ROBOT ASSISTED, LAPAROSCOPIC, MYOMECTOMY: SHX5501

## 2015-07-28 HISTORY — PX: LAPAROSCOPIC, LYSIS, ADHESIONS: SHX4534

## 2015-07-28 LAB — GLUCOSE WHOLE BLOOD - POCT: Whole Blood Glucose POCT: 100 mg/dL (ref 70–100)

## 2015-07-28 LAB — URINE HCG QUALITATIVE: Urine HCG Qualitative: NEGATIVE

## 2015-07-28 SURGERY — ROBOT ASSISTED, LAPAROSCOPIC, MYOMECTOMY
Anesthesia: Anesthesia General | Site: Pelvis | Wound class: Clean Contaminated

## 2015-07-28 MED ORDER — GENTAMICIN IN SALINE 1.6-0.9 MG/ML-% IV SOLN
INTRAVENOUS | Status: AC
Start: 2015-07-28 — End: ?
  Filled 2015-07-28: qty 50

## 2015-07-28 MED ORDER — BUPIVACAINE HCL (PF) 0.5 % IJ SOLN
INTRAMUSCULAR | Status: AC
Start: 2015-07-28 — End: ?
  Filled 2015-07-28: qty 30

## 2015-07-28 MED ORDER — GLYCOPYRROLATE 1 MG/5ML IJ SOLN
INTRAMUSCULAR | Status: AC
Start: 2015-07-28 — End: ?
  Filled 2015-07-28: qty 5

## 2015-07-28 MED ORDER — KETOROLAC TROMETHAMINE 30 MG/ML IJ SOLN
INTRAMUSCULAR | Status: DC | PRN
Start: 2015-07-28 — End: 2015-07-28
  Administered 2015-07-28: 30 mg via INTRAVENOUS

## 2015-07-28 MED ORDER — PROMETHAZINE HCL 25 MG/ML IJ SOLN
6.2500 mg | Freq: Once | INTRAMUSCULAR | Status: DC | PRN
Start: 2015-07-28 — End: 2015-07-28

## 2015-07-28 MED ORDER — CELECOXIB 100 MG PO CAPS
400.0000 mg | ORAL_CAPSULE | Freq: Once | ORAL | Status: AC
Start: 2015-07-28 — End: 2015-07-28
  Administered 2015-07-28: 400 mg via ORAL

## 2015-07-28 MED ORDER — KETOROLAC TROMETHAMINE 30 MG/ML IJ SOLN
INTRAMUSCULAR | Status: AC
Start: 2015-07-28 — End: ?
  Filled 2015-07-28: qty 1

## 2015-07-28 MED ORDER — IBUPROFEN 200 MG PO TABS
600.0000 mg | ORAL_TABLET | Freq: Four times a day (QID) | ORAL | Status: DC | PRN
Start: 2015-07-28 — End: 2016-03-01

## 2015-07-28 MED ORDER — DEXAMETHASONE SODIUM PHOSPHATE 4 MG/ML IJ SOLN
INTRAMUSCULAR | Status: AC
Start: 2015-07-28 — End: ?
  Filled 2015-07-28: qty 1

## 2015-07-28 MED ORDER — ONDANSETRON HCL 4 MG/2ML IJ SOLN
INTRAMUSCULAR | Status: AC
Start: 2015-07-28 — End: ?
  Filled 2015-07-28: qty 2

## 2015-07-28 MED ORDER — ACETAMINOPHEN 500 MG PO TABS
ORAL_TABLET | ORAL | Status: AC
Start: 2015-07-28 — End: ?
  Filled 2015-07-28: qty 2

## 2015-07-28 MED ORDER — ROCURONIUM BROMIDE 50 MG/5ML IV SOLN
INTRAVENOUS | Status: DC | PRN
Start: 2015-07-28 — End: 2015-07-28
  Administered 2015-07-28: 50 mg via INTRAVENOUS

## 2015-07-28 MED ORDER — ROCURONIUM BROMIDE 50 MG/5ML IV SOLN
INTRAVENOUS | Status: AC
Start: 2015-07-28 — End: ?
  Filled 2015-07-28: qty 5

## 2015-07-28 MED ORDER — ONDANSETRON HCL 4 MG/2ML IJ SOLN
INTRAMUSCULAR | Status: DC | PRN
Start: 2015-07-28 — End: 2015-07-28
  Administered 2015-07-28: 4 mg via INTRAVENOUS

## 2015-07-28 MED ORDER — SODIUM CHLORIDE 0.9 % IR SOLN
Status: DC | PRN
Start: 2015-07-28 — End: 2015-07-28
  Administered 2015-07-28: 1000 mL

## 2015-07-28 MED ORDER — GABAPENTIN 300 MG PO CAPS
300.0000 mg | ORAL_CAPSULE | Freq: Once | ORAL | Status: AC
Start: 2015-07-28 — End: 2015-07-28
  Administered 2015-07-28: 300 mg via ORAL

## 2015-07-28 MED ORDER — MIDAZOLAM HCL 2 MG/2ML IJ SOLN
INTRAMUSCULAR | Status: AC
Start: 2015-07-28 — End: ?
  Filled 2015-07-28: qty 2

## 2015-07-28 MED ORDER — MIDAZOLAM HCL 2 MG/2ML IJ SOLN
INTRAMUSCULAR | Status: DC | PRN
Start: 2015-07-28 — End: 2015-07-28
  Administered 2015-07-28: 2 mg via INTRAVENOUS

## 2015-07-28 MED ORDER — DEXAMETHASONE SODIUM PHOSPHATE 4 MG/ML IJ SOLN (WRAP)
INTRAMUSCULAR | Status: DC | PRN
Start: 2015-07-28 — End: 2015-07-28
  Administered 2015-07-28: 4 mg via INTRAVENOUS

## 2015-07-28 MED ORDER — OXYCODONE-ACETAMINOPHEN 5-325 MG PO TABS
1.0000 | ORAL_TABLET | ORAL | 0 refills | Status: DC | PRN
Start: 2015-07-28 — End: 2015-08-01
  Filled 2015-07-28: qty 20, 4d supply, fill #0

## 2015-07-28 MED ORDER — GLYCOPYRROLATE 0.2 MG/ML IJ SOLN
INTRAMUSCULAR | Status: DC | PRN
Start: 2015-07-28 — End: 2015-07-28
  Administered 2015-07-28: 0.4 mg via INTRAVENOUS

## 2015-07-28 MED ORDER — GENTAMICIN IN SALINE 1.6-0.9 MG/ML-% IV SOLN
80.0000 mg | Freq: Once | INTRAVENOUS | Status: AC
Start: 2015-07-28 — End: 2015-07-28
  Administered 2015-07-28: 80 mg via INTRAVENOUS

## 2015-07-28 MED ORDER — CELECOXIB 100 MG PO CAPS
ORAL_CAPSULE | ORAL | Status: AC
Start: 2015-07-28 — End: ?
  Filled 2015-07-28: qty 4

## 2015-07-28 MED ORDER — MEPERIDINE HCL 25 MG/ML IJ SOLN
12.5000 mg | INTRAMUSCULAR | Status: DC | PRN
Start: 2015-07-28 — End: 2015-07-28

## 2015-07-28 MED ORDER — LIDOCAINE HCL 2 % IJ SOLN
INTRAMUSCULAR | Status: DC | PRN
Start: 2015-07-28 — End: 2015-07-28
  Administered 2015-07-28: 40 mg

## 2015-07-28 MED ORDER — NEOSTIGMINE METHYLSULFATE 1 MG/ML IJ SOLN
INTRAMUSCULAR | Status: DC | PRN
Start: 2015-07-28 — End: 2015-07-28
  Administered 2015-07-28: 2.5 mg via INTRAVENOUS

## 2015-07-28 MED ORDER — OXYCODONE-ACETAMINOPHEN 5-325 MG PO TABS
1.0000 | ORAL_TABLET | Freq: Once | ORAL | Status: DC | PRN
Start: 2015-07-28 — End: 2015-07-28

## 2015-07-28 MED ORDER — NEOSTIGMINE METHYLSULFATE 1 MG/ML IJ SOLN
INTRAMUSCULAR | Status: AC
Start: 2015-07-28 — End: ?
  Filled 2015-07-28: qty 10

## 2015-07-28 MED ORDER — HYDROMORPHONE HCL 1 MG/ML IJ SOLN
0.5000 mg | INTRAMUSCULAR | Status: DC | PRN
Start: 2015-07-28 — End: 2015-07-28

## 2015-07-28 MED ORDER — PATIENT SUPPLIED NON FORMULARY
Status: DC | PRN
Start: 2015-07-28 — End: 2015-07-28
  Administered 2015-07-28: 2000 mL via TOPICAL

## 2015-07-28 MED ORDER — GENTAMICIN IN SALINE 2-0.9 MG/ML-% IV SOLN
1.5000 mg/kg | Freq: Once | INTRAVENOUS | Status: DC
Start: 2015-07-28 — End: 2015-07-28
  Filled 2015-07-28: qty 50

## 2015-07-28 MED ORDER — VASOPRESSIN 20 UNIT/ML IV SOLN
INTRAVENOUS | Status: AC
Start: 2015-07-28 — End: ?
  Filled 2015-07-28: qty 1

## 2015-07-28 MED ORDER — LACTATED RINGERS IV SOLN
INTRAVENOUS | Status: DC
Start: 2015-07-28 — End: 2015-07-28

## 2015-07-28 MED ORDER — BUPIVACAINE HCL 0.5 % IJ SOLN
INTRAMUSCULAR | Status: DC | PRN
Start: 2015-07-28 — End: 2015-07-28
  Administered 2015-07-28: 20 mL

## 2015-07-28 MED ORDER — GABAPENTIN 300 MG PO CAPS
ORAL_CAPSULE | ORAL | Status: AC
Start: 2015-07-28 — End: ?
  Filled 2015-07-28: qty 1

## 2015-07-28 MED ORDER — PATIENT SUPPLIED NON FORMULARY
Status: DC | PRN
Start: 2015-07-28 — End: 2015-07-28
  Administered 2015-07-28: 4 mL via TOPICAL

## 2015-07-28 MED ORDER — PROPOFOL 10 MG/ML IV EMUL (WRAP)
INTRAVENOUS | Status: DC | PRN
Start: 2015-07-28 — End: 2015-07-28
  Administered 2015-07-28: 150 mg via INTRAVENOUS

## 2015-07-28 MED ORDER — FENTANYL CITRATE (PF) 50 MCG/ML IJ SOLN (WRAP)
INTRAMUSCULAR | Status: AC
Start: 2015-07-28 — End: ?
  Filled 2015-07-28: qty 2

## 2015-07-28 MED ORDER — FENTANYL CITRATE (PF) 50 MCG/ML IJ SOLN (WRAP)
INTRAMUSCULAR | Status: DC | PRN
Start: 2015-07-28 — End: 2015-07-28
  Administered 2015-07-28 (×2): 50 ug via INTRAVENOUS

## 2015-07-28 MED ORDER — FENTANYL CITRATE (PF) 50 MCG/ML IJ SOLN (WRAP)
25.0000 ug | INTRAMUSCULAR | Status: DC | PRN
Start: 2015-07-28 — End: 2015-07-28

## 2015-07-28 MED ORDER — ONDANSETRON HCL 4 MG/2ML IJ SOLN
4.0000 mg | Freq: Once | INTRAMUSCULAR | Status: DC | PRN
Start: 2015-07-28 — End: 2015-07-28

## 2015-07-28 MED ORDER — PROPOFOL 10 MG/ML IV EMUL (WRAP)
INTRAVENOUS | Status: AC
Start: 2015-07-28 — End: ?
  Filled 2015-07-28: qty 20

## 2015-07-28 MED ORDER — ACETAMINOPHEN 500 MG PO TABS
1000.0000 mg | ORAL_TABLET | Freq: Once | ORAL | Status: AC
Start: 2015-07-28 — End: 2015-07-28
  Administered 2015-07-28: 1000 mg via ORAL

## 2015-07-28 SURGICAL SUPPLY — 76 items
APPLICATOR SRG SS DUPLOSPRAY MIS 30CM (Hemostat) ×4
APPLICATOR SRGCL 30CM SS SNP LCK CNNL TUBE ST RPLCBL TP DUPLOSPRAY MIS (Hemostat) IMPLANT
BAG ENDO POUCH (Procedure Accessories) ×2 IMPLANT
CATHETER SURGICAL OD6 FR COLPO-PNEUMO (Procedure Accessories) ×2
DEVICE CLOSURE L30 CM 2-0 GS-21 V-LOC 90 (Suture) ×2
DEVICE CLOSURE WECK EFX SHIELD UNIFORM (Disposable Instruments) ×2
DEVICE CLSR WECK EFX SHLD UNFRM SUT (Disposable Instruments) ×2
DEVICE WECK EFX SHIELD CLOSURE UNIFORM SUTURE RETRIEVAL WING FINGER (Disposable Instruments) IMPLANT
DRAPE SCOPEPILLOW 44 X 66 (Drape) ×4 IMPLANT
DRAPE SRG PE STRDRP 17X11IN LF STRL ADH (Drape) ×12
DRAPE SURGICAL ADHESIVE STRIP SMALL TOWEL MATTE FINISH L17 IN X W11 IN (Drape) ×6 IMPLANT
FIBRIN SEALANT TISSEEL 4ML (Hemostat) ×2 IMPLANT
FORCEPS BIPLR FEN SNGL 5MM (Procedure Accessories) IMPLANT
GLOVE SURG BIOGEL PF LTX SZ7.0 (Glove) ×12 IMPLANT
HOOK DVNCI SGLSITE CAUTERY 5MM (Instrument) ×2 IMPLANT
IRRIGATOR SUCTN PUMP/HANDPIECE (Suction) ×2 IMPLANT
JELLY LUB SRGLB LF STRL 3GM (Procedure Accessories) ×3 IMPLANT
JELLY SRGLB LUB 3GM STRL LF (Procedure Accessories) ×1
KIT CATHETER FOLEY 18F 5CC 2WY (Catheter Micellaneous) ×2
KIT CATHETER FOLEY 18F 5CC 2WY (Catheter Miscellaneous) ×2 IMPLANT
LABEL SHEET CUSTOM GYN (Other) ×4 IMPLANT
MANIPULATOR UTERINE OD6 FR COLPO-PNEUMO OCCLUDER SILICONE (Procedure Accessories) ×2 IMPLANT
OCCLUDER COLPO PNEUMO (Procedure Accessories) ×2
PORT HAND ACCESS SINGLE-SITE ID8.5 MM (Instrument) IMPLANT
PORT HND ACC SGL-ST 8.5MM LF STRL DISP (Instrument)
POUCH SPEC RTRVL PU E-CTCH GLD 10MM 34.5 (Laparoscopy Supplies)
POUCH SPECIMEN RETRIEVAL L34.5 CM (Laparoscopy Supplies)
POUCH SPECIMEN RETRIEVAL L34.5 CM ERGONOMIC HANDLE LONG CYLINDRICAL (Laparoscopy Supplies) IMPLANT
SEAL CANNULA 5MM (Procedure Accessories) ×4 IMPLANT
SEAL CANNULA 8.5-13MM (Procedure Accessories) ×4 IMPLANT
SEAL CANNULA 8MM DA VINCI ENDOWRIST DISPOSABLE (Procedure Accessories) ×2 IMPLANT
SEAL CNN DVNC EWRST 8MM DISP (Procedure Accessories) ×4
SET IRR DEHP 10 GTT/ML STRG 81IN LF STRL (Tubing)
SET IRRIGATION L81 IN 10 GTT/ML STRAIGHT (Tubing)
SET IRRIGATION L81 IN 10 GTT/ML STRAIGHT NA DEHP BLADDER REGULATE (Tubing) IMPLANT
SOLUTION IRR 0.9% NACL 1000ML LF STRL (Irrigation Solutions) ×2
SOLUTION IRRIGATION 0.9% SODIUM CHLORIDE (Irrigation Solutions) ×2
SOLUTION IRRIGATION 0.9% SODIUM CHLORIDE 1000 ML PLASTIC POUR BOTTLE (Irrigation Solutions) ×2 IMPLANT
SOLUTION IV 0.9% NACL 1000ML VFLX LF PLS (IV Solutions) ×2
SOLUTION IV 0.9% SODIUM CHLORIDE PVC (IV Solutions) ×2
SOLUTION IV 0.9% SODIUM CHLORIDE PVC 1000 ML PH 5 PLASTIC CONTAINER (IV Solutions) ×2 IMPLANT
SUTURE ABS 0 CT1 PDS2 36IN MFL VIOL (Suture) ×2
SUTURE MONOCRYL 4-0 PS2 27IN (Suture) ×4 IMPLANT
SUTURE PDS II 0 CT-1 L36 IN MONOFILAMENT (Suture) ×2
SUTURE PDS II 0 CT-1 L36 IN MONOFILAMENT VIOLET ABSORBABLE (Suture) ×2 IMPLANT
SUTURE V-LOC 2-0 12IN VIOLET (Suture) ×2
SUTURE V-LOC 90 2-0 GS-21 1/2 CIRCLE L12 IN ABS VIOLET (Suture) IMPLANT
SUTURE VICRYL 0-0 CT1 (Suture) IMPLANT
SUTURE VICRYL 2-0 CT1 36IN (Suture) ×8 IMPLANT
SUTURE VICRYL 2-0 SH 27IN (Suture) ×4 IMPLANT
SYRINGE 50 ML GRADUATE NONPYROGENIC DEHP (Syringes, Needles) ×2
SYRINGE 50 ML GRADUATE NONPYROGENIC DEHP FREE PVC FREE BD MEDICAL (Syringes, Needles) ×2 IMPLANT
SYRINGE 50 ML GRADUATE NONPYROGENIC DEHP FREE PVC FREE LOK MEDICAL (Syringes, Needles) IMPLANT
SYRINGE MED 50ML LF STRL GRAD N-PYRG (Syringes, Needles) ×2
SYRINGE MED 50ML LL LF STRL GRAD N-PYRG (Syringes, Needles)
SYSTEM IMAGING 8X6IN CLEARIFY MICROFIBER WARM HUB TRCR WIPE DSPSBL (Kits) ×2 IMPLANT
SYSTEM IMG MRFBR CLEARIFY 8X6IN WRM HUB (Kits) ×4
TIP MANIPULATOR RUMI II OD6.7 MM (Procedure Accessories) ×2
TIP MANIPULATOR RUMI II OD6.7 MM FLEXIBLE UTERINE L10 CM GREEN (Procedure Accessories) IMPLANT
TIP MANIPULATOR RUMI II OD6.7 MM FLEXIBLE UTERINE L8 CM BLUE (Procedure Accessories) IMPLANT
TIP MANIPULATOR RUMI II OD6.7 MM UTERINE (Procedure Accessories)
TIP MANIPULATOR RUMI II OD6.7 MM UTERINE L6 CM WHITE (Procedure Accessories) IMPLANT
TIP MNPLT RUMI II 6.7MM 6CM LF STRL UT (Procedure Accessories)
TIP MNPLT RUMI II 6.7MM 8CM LF STRL FLXB (Procedure Accessories) ×1
TIP MNPLT SS SIL RUMI II 6.7MM 10CM LF (Procedure Accessories)
TIP RUMI 6.7MM X 10CM (Procedure Accessories)
TIP RUMI 6.7MM X 6CM (Procedure Accessories)
TIP RUMI 6.7MM X 8CM (Procedure Accessories) ×1
TOWEL STERILE REUSABLE 8PK (Procedure Accessories) ×4 IMPLANT
TRAY DAVINCI GYN PACK (Pack) ×4 IMPLANT
TROCAR BLADELESS ENDO 12X10MM (Laparoscopy Supplies) ×2 IMPLANT
TROCAR BLADELESS ENDO 5X100MM (Laparoscopy Supplies) ×4 IMPLANT
WATER STERILE PLASTIC POUR BOTTLE 1000 (Irrigation Solutions) ×4
WATER STERILE PLASTIC POUR BOTTLE 1000 ML (Irrigation Solutions) ×4 IMPLANT
WATER STRL 1000ML LF PLS PR BTL (Irrigation Solutions) ×2
WATER STRL 1000ML PLS PR BTL LF (Irrigation Solutions) ×2

## 2015-07-28 NOTE — Brief Op Note (Signed)
BRIEF OP NOTE    Date Time: 07/28/2015 10:33 AM    Patient Name:   Christina Torres    Date of Operation:   07/28/2015    Providers Performing:   Surgeon(s):  Dondra Prader, MD  Pullarkat, Derald Macleod, MD    Assistant (s):   Circulator: Jeannett Senior, RN  Relief Circulator: Linna Caprice, RN  Relief Scrub: Gae Dry  Scrub Person: Harlow Mares  First Assistant: Willette Brace    Operative Procedure:   Procedure(s):  ROBOT ASSISTED, LAPAROSCOPIC, MYOMECTOMY  LAPAROSCOPIC, LYSIS, ADHESIONS    Preoperative Diagnosis:   Pre-Op Diagnosis Codes:     * Uterine leiomyoma, unspecified location [D25.9]     * Intermenstrual pain [N94.0]    Postoperative Diagnosis:   Post-Op Diagnosis Codes:     * Uterine leiomyoma, unspecified location [D25.9]     * Intermenstrual pain [N94.0]    Anesthesia:   General    Estimated Blood Loss:    150 mL    Implants:   None     Drains:   Drains: foley during procedure only     Specimens:        SPECIMENS (last 24 hours)      Pathology Specimens       07/28/15 0900             Specimen Information    Specimen Testing Required Routine Pathology       Specimen ID  A       Specimen Description Pelvic adhesion       Specimen Information    Specimen Testing Required Routine Pathology       Specimen ID  B       Specimen Description Fibroids           Findings:   dense adhesion to small bowel and To Rectosigmoid due to prior myomectomy .Required General surgery intra Op Consultation with Dr Mayer Masker    3 myomas were removed , without complications and was morcellated out in a 12 mm Bag     Complications:   None       Signed by: Dondra Prader, MD                                                                           [facility]

## 2015-07-28 NOTE — Discharge Instructions (Signed)
No vaginal sex , tampons or douches for 2 weeks   Avoid pregnancy for 6 months   Will need a c section if pregnancy happen .   anticipate Irregular Bleeding for the next 3 months       Discharge Instructions for Gynecologic Surgery  You had gynecologic surgery. This sheet contains information about what you can and can't do after your surgery. Remember, you need to take it easy.  Activity   Continue the coughing and deep breathing exercises that you learned in the hospital.   Limit your activity for4-6weeks.   Don't lift anything heavier than5-10pounds.   Avoid strenuous activities, such as mowing the lawn, vacuuming, or playing sports.   Limit your activity to short, slow walks. Gradually increase your pace and distance as you feel able.   Listen to your body. If an activity causes pain, stop.   Don't drive [id number]. You may ride in a car for short trips.   Rest when you are tired.   Don't have sexual intercourse or use tampons or douches until your doctor says it's safe to do so.  Home Care   Always keep your incision clean and dry.   Shower as needed. Wash your incision gently with mild soap and warm water and pat dry.   Check your temperature every day for1week(s) after your surgery.   Take your medication exactly as instructed by your doctor.   Return to your diet as you feel able. Eat a healthy, well-balanced diet.   Avoid constipation.  1. Use laxatives, stool softeners, or enemas as directed by your doctor.  2. Eat more high-fiber foods.  3. Drink6-8 glasses of water every day, unless directed otherwise.  Follow-Up  Make a follow-up appointment as directed by our staff.     [address] [employer] [address], [place], [place] [address]. All rights reserved. This information is not intended as a substitute for professional medical care. Always follow your healthcare professional's instructions.    You have received Toradol 30mg  at____ . Do not take  Ibuprofen/Advil/Motrin until ______.     Anesthesia: After Your Surgery  You've just had surgery. During surgery, you received medication called anesthesia to keep you comfortable and pain-free. After surgery, you may experience some pain or nausea. This is normal. Here are some tips for feeling better and recovering after surgery.     Stay on schedule with your medication.   Going Home  Your doctor or nurse will show you how to take care of yourself when you go home. He or she will also answer your questions. Have an adult family member or friend drive you home. For the first 24 hours after your surgery:   Do not drive or use heavy equipment.   Do not make important decisions or sign documents.   Avoid alcohol.   Have someone stay with you, if possible. They can watch for problems and help keep you safe.  Be sure to keep all follow-up doctor's appointments. And rest after your procedure for as long as your doctor tells you to.    Coping with Pain  If you have pain after surgery, pain medication will help you feel better. Take it as directed, before pain becomes severe. Also, ask your doctor or pharmacist about other ways to control pain, such as with heat, ice, and relaxation. And follow any other instructions your surgeon or nurse gives you.    Tips for Taking Pain Medication  To get the  best relief possible, remember these points:   Pain medications can upset your stomach. Taking them with a little food may help.   Most pain relievers taken by mouth need at least 20 to 30 minutes to take effect.   Taking medication on a schedule can help you remember to take it. Try to time your medication so that you can take it before beginning an activity, such as dressing, walking, or sitting down for dinner.   Constipation is a common side effect of pain medications. Drink lots of fluids. Eating fruit and vegetables can also help. Don't take laxatives unless your surgeon has prescribed them.   Mixing alcohol and  pain medication can cause dizziness and slow your breathing. It can even be fatal. Don't drink alcohol while taking pain medication.   Pain medication can slow your reflexes. Don't drive or operate machinery while taking pain medication,    Managing Nausea  Some people have an upset stomach after surgery. This is often due to anesthesia, pain, pain medications, or the stress of surgery. The following tips will help you manage nausea and get good nutrition as you recover. If you were on a special diet before surgery, ask your doctor if you should follow it during recovery. These tips may help:   Don't push yourself to eat. Your body will tell you what to eat and when.   Start off with liquids and soup. They are easier to digest.   Progress to semisolids (mashed potatoes, applesauce, and gelatin) as you feel ready.   Slowly move to solid foods. Don't eat fatty, rich, or spicy foods at first.   Don't force yourself to have three large meals a day. Instead, eat smaller amounts more often.   Take pain medications with a small amount of solid food, such as crackers or toast.    Important:   Prevent blood clots by wiggling your toes and tightening your calf muscle 20 times every hour while awake until you resume normal walking activity.   If unable to urinate in 6-8 hours, call your surgeon or go to the Emergency Department.     Call Your Surgeon If.   You still have pain an hour after taking medication (it may not be strong enough).   You feel too sleepy, dizzy, or groggy (medication may be too strong).   You have side effects like nausea, vomiting, or skin changes (rash, itching, or hives).   Signs of infection - fever over 101, chills. Incision site redness, warmth, swelling, foul odor or purulent drainage.   Persistent bleeding at the operative site.    [address] [place], [place] [address]. All rights reserved. This information is not intended as a substitute for  professional medical care. Always follow your healthcare professional's instructions.

## 2015-07-28 NOTE — Transfer of Care (Signed)
Anesthesia Transfer of Care Note    Patient: Christina Torres    Procedures performed: Procedure(s) with comments:  ROBOT ASSISTED, LAPAROSCOPIC, MYOMECTOMY - ROBOTIC MYOMECTOMY (LAP)    LAPAROSCOPIC, LYSIS, ADHESIONS - Robotic laparoscopic lysis of pelvic and rectosigmoid adhesions    Anesthesia type: General ETT    Patient location:Phase I PACU    Last vitals:   Filed Vitals:    07/28/15 1037   BP: 104/52   Pulse: 79   Temp: 36.9 C (98.5 F)   Resp: 18   SpO2: 80%       Post pain: Patient not complaining of pain, continue current therapy      Mental Status:sedated    Respiratory Function: tolerating face mask and OPA    Cardiovascular: stable    Nausea/Vomiting: patient not complaining of nausea or vomiting    Hydration Status: adequate    Post assessment: no apparent anesthetic complications

## 2015-07-28 NOTE — Op Note (Signed)
FULL OPERATIVE NOTE    Date Time: 07/28/2015 12:20 PM  Patient Name: Christina Torres  Attending Physician: Dondra Prader, MD      Date of Operation:   07/28/2015    Providers Performing:   Surgeon(s):  Dondra Prader, MD  Pullarkat, Derald Macleod, MD    Circulator: Jeannett Senior, RN  Relief Circulator: Linna Caprice, RN  Relief Scrub: Gae Dry  Scrub Person: Harlow Mares  First Assistant: Willette Brace    Operative Procedure:   Procedure(s):  ROBOT ASSISTED, LAPAROSCOPIC, MYOMECTOMY  LAPAROSCOPIC, LYSIS, ADHESIONS    Preoperative Diagnosis:   Pre-Op Diagnosis Codes:     * Uterine leiomyoma, unspecified location [D25.9]     * Intermenstrual pain [N94.0]    Postoperative Diagnosis:   Post-Op Diagnosis Codes:     * Uterine leiomyoma, unspecified location [D25.9]     * Intermenstrual pain [N94.0]    Indications:   49 y/o G0 P0 present with menorrhagia and CPP , Patient with a known fibroid uterus , had a Open myomectomy previously as well as [place] at [facility] . Present for a 2nd myomectomy .   Discussed Recurrence with patient , Potential malignancy , As well as Open VS MIS Option .   Discussed Hysterectomy as well as she is 49 but she refused as she think she would like to get pregnant in the future     Operative Notes:   Following informed consent and antibiotic and dual deep vein thrombosis  prophylaxis, the patient was taken to the operating room, placed on the  operating table, and underwent general endotracheal anesthesia per the  anesthesia protocol. She was then sterilely prepped and draped in low  lithotomy position. A Foley catheter was inserted and the appropriate size  and length of the KOH uterine manipulator system with chromotubation tube  was placed in the standard fashion within the uterine cavity. Next,  attention was drawn to the abdomen where just above the umbilicus a 12 mm  incision was made. The long Veress needle was introduced using a positive  water droplet technique  test and after insufflation to 15 mmHg, the 12-mm  trocar was inserted under direct visualization. Two 8-mm trocars were  placed in the left and one 8, one 12-mm trocars were placed to the right.   These were all placed under direct visualization without evidence of injury  to the urinary, vascular, or intestinal system. The patient was then  brought and placed in steep Trendelenburg position. The [employer] robotic  system was brought in for docking and all instruments were placed under  direct visualization. Attention was then drawn to the abdomen where the  above findings were noted. Extensive adhesion from the open myomectomy to the small intestine and to the Rectosigmoid colon . i Lysis the small intestine adhesion using the sharp scissors and the rectosigmoid was densely adherent to the rectosigmoid so we called in General surgery Dr Mayer Masker and he did robotic LOA from the rectum with sharp dissection  . It was now time to perform the  myomectomy. The surface of the uterus to where the 7 cm myoma was located  on the posterior l region was infiltrated with Pitressin diluted 1 mL  per 10 mL of normal saline. A total of 50 mL were used intraoperatively.   After injecting, the incision was made over myoma. The myoma was carefully  dissected free with attention at all times noting anywhere near the  proximity of the  fallopian tubes. With it removed, it was then placed into  a bag and placed in the left pericolic gutter. 2 other fibroid were noted and was removed using robotic tenaculum and sharp scissors  Next, the However,  there was an evidence of any interruption of the endometrial cavity. And was repaired with 3-0 Monocryl   . Next, the myometrium was  closed in 4 layers, the initial layer was just below the endometrium and  this consisted of a running 2-0 V-lock  suture. After this, with an area  of muscle over the endometrium, the remaining 3 layers were closed. The  last layer on the subserosal region was  closed with a baseball stitch  utilizing 0 V-Loc suture. Excellent hemostasis was noted.  With the procedure complete, the [employer]  robotic system was undocked after excellent hemostasis was noted. The mass  that had been previously placed into a large spleen bag was then brought  through the right upper quadrant assistant port. The assistant port was  extended. The 3 masses  was morcellated inside the bag and following removal of  the bag and the mass the bag was checked for leakage by instilling it full  with normal saline solution. There was no leakage from the bag and  therefore no ability to spill any contents of the morcellator fibroid  intraabdominally. The fascial incision in the right upper quadrant was  closed with a running 0 Vicryl suture ligature, the subcutaneous tissue  with 2-0 Vicryl and the skin incision with 4-0 Monocryl. The abdomen was  then reinsufflated and laparoscopically was noted to have excellent  hemostasis. The 12-mm trocar was then removed. The port site was closed  with 0 Vicryl suture ligature figure-of-eight suture utilizing the  Carter-Thomason needle passage device and incorporating the fascia and the  peritoneum to prevent future herniation. The gas was allowed to escape  from the abdomen and all 8-mm trocar sites were removed under direct  visualization. The skin incisions were all closed with 4-0 Monocryl.   Sterile dressings were placed. The patient was taken out of general  endotracheal anesthesia per the anesthesia protocol, taken to the recovery  room and left there in stable condition. Sponge and needle counts were  reported as correct x2.    Estimated Blood Loss:   150 mL    Implants:   None     Drains:   Drains: foley    Specimens:        SPECIMENS (last 24 hours)      Pathology Specimens       07/28/15 0900             Specimen Information    Specimen Testing Required Routine Pathology       Specimen ID  A       Specimen Description Pelvic adhesion       Specimen  Information    Specimen Testing Required Routine Pathology       Specimen ID  B       Specimen Description Fibroids           Complications:   None     Signed by: Dondra Prader, MD

## 2015-07-28 NOTE — Anesthesia Postprocedure Evaluation (Signed)
Anesthesia Post Evaluation    Patient: Christina Torres    Procedures performed: Procedure(s) with comments:  ROBOT ASSISTED, LAPAROSCOPIC, MYOMECTOMY - ROBOTIC MYOMECTOMY (LAP)    LAPAROSCOPIC, LYSIS, ADHESIONS - Robotic laparoscopic lysis of pelvic and rectosigmoid adhesions    Anesthesia type: General ETT    Patient location:Phase I PACU    Last vitals:   Filed Vitals:    07/28/15 1037   BP: 104/52   Pulse: 79   Temp: 36.9 C (98.5 F)   Resp: 18   SpO2: 80%       Post pain: Patient not complaining of pain, continue current therapy      Mental Status:drowsy but arousable    Respiratory Function: tolerating face mask    Cardiovascular: stable    Nausea/Vomiting: patient not complaining of nausea or vomiting    Hydration Status: adequate    Post assessment: no apparent anesthetic complications

## 2015-07-28 NOTE — Anesthesia Preprocedure Evaluation (Addendum)
Anesthesia Evaluation    AIRWAY    Mallampati: II    TM distance: >3 FB  Neck ROM: full  Mouth Opening:full   CARDIOVASCULAR    cardiovascular exam normal       DENTAL    no notable dental hx     PULMONARY    pulmonary exam normal     OTHER FINDINGS              PSS Anesthesia Comments: 07/08/2015 EKG: NSR, WNL, H/H 10.6/33.3, BMP WNL.  Previous GA without problems.  No seizure history; told to have F/U CT scan in 10 years to follow her cerebral aneurysms (they were apparently unchanged on most recent CT scan after 5 years).        Anesthesia Plan    ASA 3     general                     intravenous induction   Detailed anesthesia plan: general endotracheal            informed consent obtained    Plan discussed with CRNA.    ECG reviewed  pertinent labs reviewed

## 2015-07-29 ENCOUNTER — Encounter: Payer: Self-pay | Admitting: Obstetrics and Gynecology

## 2015-07-30 LAB — LAB USE ONLY - HISTORICAL SURGICAL PATHOLOGY

## 2015-08-01 ENCOUNTER — Telehealth (INDEPENDENT_AMBULATORY_CARE_PROVIDER_SITE_OTHER): Payer: Self-pay | Admitting: Family Medicine

## 2015-08-01 NOTE — Telephone Encounter (Signed)
On call physician note:    Patient called answering service requesting medication for pain mangement.  Patient is POD # 4 s/p laparoscopic myomectomy and lysis of adhesions performed Tuesday 07/28/15 for uterine leiomyoma.  Patient was originally prescribed oxycodone-acetaminophen 5-325 mg q4h PRN for pain by surgical team.  Patient ran out of medication.  I have called in a prescription for tramadol 50 mg q6h PRN for pain to the [facility] in [place] to prevent the patient from having to go to the ED.  I explained to the patient that this medication is not as potent as the oxycodone and that if it does not control her pain she can proceed to the ED. However, if she is given a different pain medication at the ED she is not to combine the tramadol I have prescribed with any other pain medications otherwise she may have a hypotensive episode or respiratory depression.  I advised the patient to proceed to the ED if she develops fever, chills, sweats, light-headedness, dizziness, chest pain, shortness of breath, worsening of her abdominal pain or any new or worsening symptoms.  Patient to follow up with surgical team.  Patient verbalized understanding of our plan and agrees to call the answering service or proceed to the  ED if necessary.      Dr. Glenice Laine

## 2015-08-03 NOTE — Telephone Encounter (Signed)
Please call pt to check on her. Please find out when she is following up with the surgeon and that her surgeon should manage her post op pain control.

## 2015-08-03 NOTE — Telephone Encounter (Signed)
Called patient regarding Dr. Janit Pagan note.  Verbalized understanding.  Patient stated that she is still having some pain  in right abdomen but, taking medication as prescribed and it is helping.

## 2015-08-04 NOTE — Op Note (Signed)
Procedure Date: 07/28/2015     Patient Type: A     SURGEON: Maayan Jenning R Enes Rokosz MD  ASSISTANT:       ASSISTANT:  Cindy Campoverde, CSA, who was essential for dynamic retraction, exposure,  visualization and robotic manipulation.     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     TITLE OF PROCEDURE:  Laparoscopic robotic-assisted lysis of adhesions.     ANESTHESIA:  General.     INDICATIONS FOR THE PROCEDURE:  I was called intraoperatively by Dr. Aggie Cosier to assist in a lysis  of adhesions for this patient.  He was doing a partial myomectomy for a  uterine fibroid and the patient had prior abdominal surgery with what  appeared to be small intestine attached to the uterus.  He attempted to  remove this robotically, however, was concerned about potential for bowel  injury and then asked for my assistance.       DESCRIPTION OF PROCEDURE:  I sat at the robotic console and then was able  to assess the situation.  She did have a fairly good size uterine mass and  adhesions to the uterus.  I did take these down with both a combination of  blunt and sharp dissection by the uterine wall decided to resect part of  this wall so as to not injure any of the small intestine or colon that was  nearby.  Hemostasis was assured with the electrocautery.  Using the lysis  of adhesions, we were able to free up the intestine from the uterine wall.   I examined the bowel wall and determined that there was no injury or  serosal tear to the bowel wall and that the uterus itself was intact and  the scar tissue had been taken down.       At this point, we turned the case over to Dr. Aggie Cosier to complete  his portion of the procedure.  I was not able to get consent for the  procedure as the patient was already asleep and was already included in the  patient's consent with Dr. Robby Sermon.  We are happy to assist in any way  with the  patient's postoperative care.           D:  08/04/2015 07:12 AM by Dr. Clelia Croft, MD ([id number])  T:  08/04/2015 13:01 PM by NTS      Everlean Cherry: [id number]) (Doc ID: [id number])

## 2015-08-13 ENCOUNTER — Ambulatory Visit: Payer: Medicare Other

## 2015-08-13 ENCOUNTER — Other Ambulatory Visit (INDEPENDENT_AMBULATORY_CARE_PROVIDER_SITE_OTHER): Payer: Self-pay | Admitting: Internal Medicine

## 2015-08-13 ENCOUNTER — Ambulatory Visit (INDEPENDENT_AMBULATORY_CARE_PROVIDER_SITE_OTHER): Payer: Medicare Other | Admitting: Internal Medicine

## 2015-08-13 ENCOUNTER — Encounter (INDEPENDENT_AMBULATORY_CARE_PROVIDER_SITE_OTHER): Payer: Self-pay | Admitting: Internal Medicine

## 2015-08-13 ENCOUNTER — Ambulatory Visit: Payer: Medicare Other | Attending: Internal Medicine

## 2015-08-13 VITALS — BP 127/81 | HR 89 | Temp 98.5°F | Resp 16 | Wt 179.8 lb

## 2015-08-13 DIAGNOSIS — R739 Hyperglycemia, unspecified: Secondary | ICD-10-CM

## 2015-08-13 DIAGNOSIS — Z1231 Encounter for screening mammogram for malignant neoplasm of breast: Secondary | ICD-10-CM | POA: Insufficient documentation

## 2015-08-13 LAB — POCT GLUCOSE: Whole Blood Glucose POCT: 90 mg/dL (ref 70–100)

## 2015-08-13 NOTE — Patient Instructions (Signed)
High Blood Sugar (Hyperglycemia)  Too much glucose (sugar) in your blood is called hyperglycemia or high blood sugar. High blood sugar can lead to a dangerous condition called ketoacidosis. In severe cases, it can lead to coma.  Possible causes of hyperglycemia   Inadequate treatment plan for diabetes   Being sick   Being under stress   Taking certain medications, such as steroids   Eating too much food, especially carbohydrates   Being less active than usual   Not taking enough diabetes medication  Symptoms of hyperglycemia  Hyperglycemia may not cause symptoms. If you do have symptoms, they may include:   Thirst   Frequent need to urinate   Feeling tired   Nausea   Itchy, dry skin   Blurry vision   Fast breathing   Weakness   Dizziness   Wounds or skin infections that don't heal   Unexplained weight loss if hyperglycemia lasts for more than a few days  What you should do   Check your blood sugar.   Drink plenty of sugar-free, caffeine-free liquids such as water. Don't drink fruit juice.   Check your blood sugar again every4hours. If you take insulin or diabetes medications, follow your sick-day plan for taking medication. Call your healthcare provider if you are not able to eat.   Check your blood or urine for ketones as directed.   Call your health care provider if your blood sugar and ketones do not return to your target range.  Preventing high blood sugar  To help keep your blood sugar from getting too high:   Control stress.   When you're ill, follow your sick-day plan.   Follow your meal plan. Eat only the amount of food on your meal plan   Follow your exercise plan.   Take your insulin or diabetes medications as directed by you health care team. Also test your blood sugar as directed. If the plan is not working for you, discuss it with your doctor.  Other things to do   Carry a medical ID card or wear a medical alert bracelet. It should say that you have diabetes. It  should also say what to do in case you pass out or go into a coma.   Make sure family, friends, and coworkers know the signs of high blood sugar. Tell them what to do if your blood sugar gets very high and you can't help yourself.   Talk to your health care team about other things you can do to prevent high blood sugar.       2000-2015 [employer] [address], [place] [address]. All rights reserved. This information is not intended as a substitute for professional medical care. Always follow your healthcare professional's instructions.

## 2015-08-13 NOTE — Progress Notes (Signed)
Subjective:      Patient ID: Christina Torres is a 49 y.o. female  Chief Complaint   Patient presents with   . Pre-diabetes     wants to double check to see if fasting glucose comes down        HPI  At surgery had fasting glucose that was 100 so concerned about prediabetes. Never had high reading before. Has decreased sugar in diet since then. Does have family hx of diabetes in dad and great uncle.   Otherwise doing well and recovered well after surgery. Pleased with BP as well. No other concerns.     The following portions of the patient's history were reviewed and updated as appropriate: current medications, allergies, past family history, past medical history, past social history, past surgical history and problem list.     Review of Systems   Constitutional: Negative.    Respiratory: Negative.    Cardiovascular: Negative.         BP 127/81 mmHg  Pulse 89  Temp(Src) 98.5 F (36.9 C) (Oral)  Resp 16  Wt 81.557 kg (179 lb 12.8 oz)  SpO2 98%  LMP 07/28/2015   Objective:   Physical Exam   Constitutional: She is oriented to person, place, and time. She appears well-developed and well-nourished. No distress.   Eyes: Conjunctivae are normal.   Cardiovascular: Normal rate, regular rhythm and normal heart sounds.    Pulmonary/Chest: Effort normal and breath sounds normal.   Neurological: She is alert and oriented to person, place, and time.   Psychiatric: She has a normal mood and affect.   Nursing note and vitals reviewed.    FS glucose 90  Assessment:     1. Hyperglycemia  POCT Glucose   2. Screening mammogram for high-risk patient  Mammo Digital Screening Bilateral W Cad         Plan:     49 y.o. female presents for follow up elevated fasting glucose during perioperative period. FS glucose today was normal and A1C from early Nov was also normal.   -reassured pt  -continue to limit carbs/sugars in diet  -follow up prn  -mammogram order given    Return for follow up current symptoms as needed.     Gust Brooms,  MD

## 2015-08-13 NOTE — Progress Notes (Signed)
Have you seen any new specialists/physicians since you were last here? Yes - OB/GYN (Dr. Robby Sermon)          Limb alert protocol reviewed?  Yes      No changes to family, medical or surgical history per pt.      Mammo due. Pt notified.

## 2015-08-20 ENCOUNTER — Telehealth (INDEPENDENT_AMBULATORY_CARE_PROVIDER_SITE_OTHER): Payer: Self-pay

## 2015-08-20 ENCOUNTER — Telehealth (INDEPENDENT_AMBULATORY_CARE_PROVIDER_SITE_OTHER): Payer: Self-pay | Admitting: Family Medicine

## 2015-08-20 NOTE — Telephone Encounter (Signed)
-----   Message from Frances Maywood sent at 08/20/2015 11:21 AM EST -----  Contact: [phone]  Patient has a virus and would like to speak with someone about over the counter things to take.

## 2015-08-20 NOTE — Telephone Encounter (Signed)
Would hold off on echinacea.  She can try ginger instead.    Continue to push fluids.  Need to maintain hydration.   Will need OV if nausea continues and vomiting ensues.

## 2015-08-20 NOTE — Telephone Encounter (Signed)
Called pt and advised her of Dr. Electa Sniff note. Pt asked how long she should wait to see if symptoms continue/worsen. Since tmrw is Friday, advised pt to push fluids and if she doesn't feel any better by morning to call PCP office. Advised pt if PCP office doesn't have available appts tmrw, there is the [facility] in [place]. Pt verbalized understanding and had no further questions.

## 2015-08-20 NOTE — Telephone Encounter (Signed)
Called pt and was informed she has been nauseated and has had diarrhea since middle of the night. The thought of food or looking at food, makes pt feel nauseated. She denies vomiting, no fever that she is aware of, however, she does have a headache. She has had ginger ale and kept it down. Pt wants to know if okay to take Echinacea. Is there anything else pt can take OTC?

## 2015-08-20 NOTE — Telephone Encounter (Signed)
Received phone call with complaints that she has some nausea and vomiting over the last 24 hours.  She is keeping down or just use in small amounts and would like to try ginger ale.  Suggested also possibility of popsicles.  Would hold off on antibiotics for now and avoid any products with aspirin

## 2015-08-21 ENCOUNTER — Telehealth (INDEPENDENT_AMBULATORY_CARE_PROVIDER_SITE_OTHER): Payer: Self-pay | Admitting: Student in an Organized Health Care Education/Training Program

## 2015-08-21 ENCOUNTER — Ambulatory Visit (INDEPENDENT_AMBULATORY_CARE_PROVIDER_SITE_OTHER): Payer: Medicare Other | Admitting: Family Medicine

## 2015-08-21 ENCOUNTER — Encounter (INDEPENDENT_AMBULATORY_CARE_PROVIDER_SITE_OTHER): Payer: Self-pay | Admitting: Family Medicine

## 2015-08-21 VITALS — BP 128/80 | HR 94 | Temp 98.4°F | Wt 177.0 lb

## 2015-08-21 DIAGNOSIS — R197 Diarrhea, unspecified: Secondary | ICD-10-CM

## 2015-08-21 DIAGNOSIS — R110 Nausea: Secondary | ICD-10-CM

## 2015-08-21 MED ORDER — ONDANSETRON HCL 4 MG PO TABS
4.0000 mg | ORAL_TABLET | Freq: Every day | ORAL | Status: DC | PRN
Start: 2015-08-21 — End: 2015-12-22

## 2015-08-21 MED ORDER — ONDANSETRON HCL 4 MG PO TABS
4.0000 mg | ORAL_TABLET | Freq: Every day | ORAL | Status: DC | PRN
Start: 2015-08-21 — End: 2015-08-21

## 2015-08-21 NOTE — Addendum Note (Signed)
Addended by: Sula Soda on: 08/21/2015 06:42 PM     Modules accepted: Orders

## 2015-08-21 NOTE — Progress Notes (Signed)
Subjective:      Patient ID: Christina Torres  is a 49 y.o.  female.     Chief Complaint   Patient presents with   . Nausea     three days   . Loss of appetite   . Diarrhea      Diarrhea   This is a new problem. The current episode started yesterday. The problem occurs 5 to 10 times per day. The problem has been gradually improving. The stool consistency is described as watery. Pertinent negatives include no abdominal pain, chills, fever, sweats or vomiting. Nothing aggravates the symptoms. Risk factors include ill contacts (Similar symptoms). She has tried nothing for the symptoms.     Nausea med    The following portions of the patient's history were reviewed and updated as appropriate: current medications, allergies, past family history, past medical history, past social history, past surgical history and problem list.       Review of Systems   Constitutional: Negative for fever, chills and diaphoresis.   Respiratory: Negative for shortness of breath.    Cardiovascular: Negative for chest pain.   Gastrointestinal: Positive for nausea and diarrhea. Negative for vomiting and abdominal pain.          BP 128/80 mmHg  Pulse 94  Temp(Src) 98.4 F (36.9 C) (Oral)  Wt 80.287 kg (177 lb)  LMP 07/28/2015    Objective:   Physical Exam   Constitutional: No distress.   Cardiovascular: Normal rate, regular rhythm and normal heart sounds.    Pulmonary/Chest: Effort normal and breath sounds normal.   Abdominal: She exhibits no distension. There is no tenderness. There is no rebound and no guarding.   Neurological: She is alert.   Skin: She is not diaphoretic.   Nursing note and vitals reviewed.         Assessment:     1. Diarrhea, unspecified type    2. Nausea           Plan:     1. Diarrhea, unspecified type  The patient likely has a viral gastroenteritis.  We will manage conservatively with symptomatic care.  I advised the patient to eat bland foods that are not irritating to her bowel.  I emphasized the importance of staying  hydrated to prevent complications.  The patient agrees to go to the emergency room if she experiences any fatigue, dizziness, lightheadedness, chest pain, palpitations or shortness of breath.    2. Nausea  We will manage the patient's nausea with an antiemetic to ensure that she is able to retain fluids.    - ondansetron (ZOFRAN) 4 MG tablet; Take 1 tablet (4 mg total) by mouth daily as needed for Nausea.  Dispense: 30 tablet; Refill: 1      Call if any new or worsening symptoms develop  Proceed to urgent care or ED after hours if necessary    Daisey Must, D.O.      Return if symptoms worsen or fail to improve.

## 2015-08-21 NOTE — Telephone Encounter (Signed)
Pt was seen in PCP office today by Dr. Glenice Laine.

## 2015-08-21 NOTE — Progress Notes (Signed)
Have you seen any new specialists/physicians since you were last here?  no        Limb alert protocol reviewed?  Yes       No changes to family, medical or surgical history per pt.

## 2015-08-21 NOTE — Telephone Encounter (Signed)
Pt reports non-bloody loose BMs for the past few days.  She took OTC loperamide with resolution of diarrhea.  However, she now feels constipated and ask if she may take Milk of Mg.  Advised pt she may take MOM as directed on label.  F/u in clinic if sx persists.

## 2015-08-21 NOTE — Patient Instructions (Signed)
Viral Diarrhea (Adult)    Diarrhea is usually due to viral gastroenteritis, another name for the "stomach flu." This virus affects the stomach and intestinal tract and usually lasts 2 to 7 days. The main danger from repeated diarrhea is dehydration -- the loss of excess water and minerals from the body. Antibiotics are not effective in this illness, but simple home treatment will be helpful.  Home Care:   If symptoms are severe, rest at home for the next 24 hours or until you are feeling better.   You may use acetaminophen (Tylenol) or ibuprofen (Motrin, Advil) to control fever unless another medicine was prescribed. [ NOTE : If you have chronic liver or kidney disease or ever had a stomach ulcer or GI bleeding, talk with your doctor before using these medicines.] (Aspirin should never be used in anyone under 18 years of age who is ill with a fever. It may cause severe liver damage.)   Avoid tobacco, caffeine and alcohol, which may worsen your symptoms.   If anti-diarrhea medicine was prescribed, take this only as directed. Sometimes anti-diarrhea medicine can make your condition worse if the cause is an infectious diarrhea. Therefore, anti-diarrhea medicine should not be taken for this condition unless advised by your doctor.  During The First 12-24 Hours follow the diet below:   BEVERAGES: Sport drinks like Gatorade, soft drinks without caffeine; ginger ale, mineral water (plain or flavored), decaffeinated tea and coffee.   SOUPS: Clear broth, consomm and bouillon   DESSERTS: Plain gelatin (Jell-O), popsicles and fruit juice bars.  During The Next 24 Hours you may add the following to the above:   Hot cereal, plain toast, bread, rolls, crackers   Plain noodles, rice, mashed potatoes, chicken noodle or rice soup   Unsweetened canned fruit (avoid pineapple), bananas   Limit fat intake to less than 15 grams per day by avoiding margarine, butter, oils, mayonnaise, sauces, gravies, fried foods, peanut  butter, meat, poultry and fish.   Limit fiber; avoid raw or cooked vegetables, fresh fruits (except bananas) and bran cereals.   Limit caffeine and chocolate. No spices or seasonings except salt.  During The Next 24 Hours  Gradually resume a normal diet, as you feel better and your symptoms lessen.  Follow Up  with your doctor as advised. Call if not improving within 24 hours or if diarrhea lasts more than one week. If a stool (diarrhea) sample was taken, you may call in 2 days (or as directed) for the results.  Get Prompt Medical Attention  if any of the following occur:   Increasing abdominal pain or constant lower right abdominal pain   Continued vomiting (unable to keep liquids down)   Frequent diarrhea (more than 5 times a day)   Blood in vomit or stool (black or red color)   Reduced oral intake   Dark urine, reduced urine output   Weakness, dizziness, fainting   Drowsiness, confusion, stiff neck or seizure   Fever of 100.4F (38C) oral or higher, not better with fever medication   New rash   2000-2015 [employer] [address], [place] [address]. All rights reserved. This information is not intended as a substitute for professional medical care. Always follow your healthcare professional's instructions.

## 2015-08-21 NOTE — Telephone Encounter (Signed)
Please call pt and check on her.

## 2015-08-21 NOTE — Addendum Note (Signed)
Addended by: Sula Soda on: 08/21/2015 06:29 PM     Modules accepted: Orders

## 2015-08-25 NOTE — Telephone Encounter (Signed)
Called lm on pt vm to call us back.  Awaiting call back

## 2015-08-25 NOTE — Telephone Encounter (Signed)
Please call pt and check on her.

## 2015-09-08 ENCOUNTER — Ambulatory Visit (INDEPENDENT_AMBULATORY_CARE_PROVIDER_SITE_OTHER): Payer: Medicare Other

## 2015-09-08 ENCOUNTER — Other Ambulatory Visit (INDEPENDENT_AMBULATORY_CARE_PROVIDER_SITE_OTHER): Payer: Self-pay

## 2015-09-08 ENCOUNTER — Telehealth (INDEPENDENT_AMBULATORY_CARE_PROVIDER_SITE_OTHER): Payer: Self-pay | Admitting: Family Medicine

## 2015-09-08 DIAGNOSIS — A184 Tuberculosis of skin and subcutaneous tissue: Secondary | ICD-10-CM

## 2015-09-08 DIAGNOSIS — IMO0001 Reserved for inherently not codable concepts without codable children: Secondary | ICD-10-CM

## 2015-09-08 DIAGNOSIS — Z111 Encounter for screening for respiratory tuberculosis: Secondary | ICD-10-CM

## 2015-09-08 NOTE — Progress Notes (Signed)
Patient presented to the office for TB screening.  The PPD was placed on the inner aspect of the Left arm.  Patient tolerated procedure and left in good condition.

## 2015-09-08 NOTE — Telephone Encounter (Signed)
TB skin test ordered.

## 2015-09-10 ENCOUNTER — Ambulatory Visit (INDEPENDENT_AMBULATORY_CARE_PROVIDER_SITE_OTHER): Payer: Medicare Other

## 2015-09-11 ENCOUNTER — Ambulatory Visit (INDEPENDENT_AMBULATORY_CARE_PROVIDER_SITE_OTHER): Payer: Medicare Other

## 2015-09-11 DIAGNOSIS — Z111 Encounter for screening for respiratory tuberculosis: Secondary | ICD-10-CM

## 2015-09-11 NOTE — Progress Notes (Signed)
Patient presented to the office for PPD reading.  Results were Negative.

## 2015-10-14 ENCOUNTER — Encounter (INDEPENDENT_AMBULATORY_CARE_PROVIDER_SITE_OTHER): Payer: Self-pay | Admitting: Internal Medicine

## 2015-10-14 ENCOUNTER — Ambulatory Visit (INDEPENDENT_AMBULATORY_CARE_PROVIDER_SITE_OTHER): Payer: Medicare Other | Admitting: Internal Medicine

## 2015-10-14 VITALS — BP 153/86 | HR 88 | Temp 98.6°F | Resp 20 | Wt 176.4 lb

## 2015-10-14 DIAGNOSIS — I10 Essential (primary) hypertension: Secondary | ICD-10-CM

## 2015-10-14 DIAGNOSIS — R350 Frequency of micturition: Secondary | ICD-10-CM

## 2015-10-14 LAB — POCT URINALYSIS DIPSTIX (10)(MULTI-TEST)
Bilirubin, UA POCT: NEGATIVE
Glucose, UA POCT: NEGATIVE mg/dL
Nitrite, UA POCT: NEGATIVE
POCT Spec Gravity, UA: 1.02 (ref 1.001–1.035)
POCT pH, UA: 7 (ref 5–8)
Protein, UA POCT: NEGATIVE mg/dL
Urobilinogen, UA: 0.2 mg/dL

## 2015-10-14 MED ORDER — HYDROCHLOROTHIAZIDE 25 MG PO TABS
25.0000 mg | ORAL_TABLET | Freq: Every day | ORAL | Status: DC
Start: 2015-10-14 — End: 2015-10-21

## 2015-10-14 NOTE — Progress Notes (Signed)
Subjective:      Patient ID: Christina Torres is a 50 y.o. female  Chief Complaint   Patient presents with   . Urinary Frequency     as soon as drinking water, has to run to the bathroom   . Dehydration     been drinking more water bc mouth has been very dry; x3 days        Urinary Frequency   This is a new problem. The current episode started in the past 7 days (2-3 days). The problem has been unchanged. The patient is experiencing no pain. There is no history of pyelonephritis. Associated symptoms include frequency. Pertinent negatives include no hematuria, nausea or vomiting. She has tried increased fluids for the symptoms. The treatment provided mild relief. 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     At night felt dry mouth and dehydrated. Increased fluids. Then roommate thought she was going to the bathroom very frequently, like every 5 minutes. Pt didn't think it was that frequent, more like every 20 min or so. No other symptoms. Otherwise feels well and dehydration improved with water consumption.     The following portions of the patient's history were reviewed and updated as appropriate: current medications, allergies, past family history, past medical history, past social history, past surgical history and problem list.     Review of Systems   Gastrointestinal: Negative for nausea and vomiting.   Genitourinary: Positive for frequency. Negative for hematuria.        BP 150/90-->153/86 mmHg  Pulse 86  Temp(Src) 98.6 F (37 C) (Oral)  Resp 20  Wt 80.015 kg (176 lb 6.4 oz)  SpO2 97%  LMP 10/05/2015   Objective:   Physical Exam   Constitutional: She appears well-developed and well-nourished. No distress.   Cardiovascular: Normal rate, regular rhythm and normal heart sounds.    No murmur heard.  Pulmonary/Chest: Effort normal and breath sounds normal.   Abdominal: Soft. Bowel sounds are normal. She exhibits no distension and no  mass. There is no tenderness. There is no rebound, no guarding and no CVA tenderness.   Nursing note and vitals reviewed.    UA: large blood (on menses), trace ketones, LE  Assessment:     1. Urinary frequency  POCT UA Dipstix (10)(Multi-Test)    Urine culture   2. Essential hypertension with goal blood pressure less than 140/90  hydroCHLOROthiazide (HYDRODIURIL) 25 MG tablet         Plan:     50 y.o. female presents for urinary frequency without other symptoms. Likely a combination of being on HCTZ and increased water consumption.   -reassured  -advised to consume goal amt of water (60-70 oz) earlier in the day  -send ucx since trace LE seen, likely contaminant, will hold on abx  -BP elevated, advised to check home bp and send via mychart or follow up in 2 weeks  -counseled on signs/symptoms to warrant re-evaluation     Return for follow up current symptoms as needed.     Gust Brooms, MD

## 2015-10-14 NOTE — Progress Notes (Signed)
Have you seen any new specialists/physicians since you were last here? No          Limb alert protocol reviewed?  Yes      No changes to family, medical or surgical history per pt.

## 2015-10-15 ENCOUNTER — Telehealth (INDEPENDENT_AMBULATORY_CARE_PROVIDER_SITE_OTHER): Payer: Self-pay

## 2015-10-15 NOTE — Telephone Encounter (Signed)
-----   Message from Mariann Laster, MD sent at 10/14/2015  5:02 PM EST -----  Please have pt check home bps since BPP remained elevated and she should bring in results to follow up in 2 weeks or send via mychart.

## 2015-10-15 NOTE — Telephone Encounter (Signed)
Called pt and advised her of Dr. Janit Pagan recommendation. Pt verbalized understanding.

## 2015-10-16 ENCOUNTER — Other Ambulatory Visit (INDEPENDENT_AMBULATORY_CARE_PROVIDER_SITE_OTHER): Payer: Self-pay

## 2015-10-16 DIAGNOSIS — R350 Frequency of micturition: Secondary | ICD-10-CM

## 2015-10-16 NOTE — Progress Notes (Signed)
Pt has Nurse Visit Monday,10/19/15 for UA Cx. Please place order.

## 2015-10-19 ENCOUNTER — Encounter (INDEPENDENT_AMBULATORY_CARE_PROVIDER_SITE_OTHER): Payer: Self-pay | Admitting: Internal Medicine

## 2015-10-19 ENCOUNTER — Other Ambulatory Visit (FREE_STANDING_LABORATORY_FACILITY): Payer: Medicare Other

## 2015-10-19 DIAGNOSIS — R350 Frequency of micturition: Secondary | ICD-10-CM

## 2015-10-21 ENCOUNTER — Other Ambulatory Visit (INDEPENDENT_AMBULATORY_CARE_PROVIDER_SITE_OTHER): Payer: Self-pay | Admitting: Internal Medicine

## 2015-10-21 NOTE — Telephone Encounter (Signed)
Called patient in regards to being seen at the ED for Hypertension.  No answer.  Left VM for patient to return call.  Awaiting call back.

## 2015-10-21 NOTE — Telephone Encounter (Signed)
Refill sent. Agree with plan to send bps this week via mychart.

## 2015-10-21 NOTE — Telephone Encounter (Addendum)
Pt returning office call regarding ED visit. Pt realized she had eaten something salty which caused her BP to spike to a high level. Pt aware of food choices causing BP to rise at times, so pt being more conscientious of food choices. Pt feels good and stated having normal BP readings when she doesn't eat salty foods. Requested pt to send BP readings by end of this week via mychart to PCP. Pt confirmed continuing to take HCTZ daily. Advised pt if any concerns/questions arise to call PCP office. Pt verbalized understanding.      While on phone with pt, informed pt of Urine Culture results. Pt stated she is feeling better, still having frequency, but doesn't always urinate like a 'faucet'. Advised pt to notify PCP office if symptoms worsen. Pt verbalized understanding.

## 2015-10-24 ENCOUNTER — Encounter (INDEPENDENT_AMBULATORY_CARE_PROVIDER_SITE_OTHER): Payer: Self-pay | Admitting: Internal Medicine

## 2015-10-26 ENCOUNTER — Ambulatory Visit (INDEPENDENT_AMBULATORY_CARE_PROVIDER_SITE_OTHER): Payer: Medicare Other | Admitting: Internal Medicine

## 2015-10-27 ENCOUNTER — Ambulatory Visit (INDEPENDENT_AMBULATORY_CARE_PROVIDER_SITE_OTHER): Payer: Medicare Other | Admitting: Internal Medicine

## 2015-10-27 ENCOUNTER — Encounter (INDEPENDENT_AMBULATORY_CARE_PROVIDER_SITE_OTHER): Payer: Self-pay | Admitting: Internal Medicine

## 2015-10-27 VITALS — BP 120/74 | HR 91 | Temp 98.6°F | Resp 20 | Wt 179.4 lb

## 2015-10-27 DIAGNOSIS — B359 Dermatophytosis, unspecified: Secondary | ICD-10-CM

## 2015-10-27 DIAGNOSIS — I10 Essential (primary) hypertension: Secondary | ICD-10-CM

## 2015-10-27 MED ORDER — TERBINAFINE HCL 1 % EX CREA
TOPICAL_CREAM | Freq: Two times a day (BID) | CUTANEOUS | Status: DC
Start: 2015-10-27 — End: 2016-01-25

## 2015-10-27 NOTE — Patient Instructions (Signed)
Ringworm of the Skin    Ringworm is a fungal infection of the skin. Despite the name, a worm doesn't cause it. The cause of ringworm is a fungus that infects the outer layers of the skin. It is also not caused by bed bugs, scabies, or lice. These are totally different.  The medical term for ringworm is tinea. It can affect most parts of your body, although it seems to do better in moist areas of the body and around hair. It can be on almost any part of your body, including:   Arms, hands, legs, chest, feet, and back   Scalp   Beard   Groin   Between the toes  Depending on where it is located, sometimes the name changes:   Tinea capitis (scalp)   Tinea cruris (groin)   Tinea corporis (body)   Tinea pedis (feet)  Causes  Ringworm is very common all over the world, including the [place] It can take less than 1 week up to 2 weeks before you develop the infection after being exposed. So, you may not figure out the exact cause.  It is spread through direct contact with:   An infected person or animal   Infected soil, or objects such as towels, clothing, and combs  Symptoms  At first you might not notice ringworm. Or you may just see a small, red, often raised itchy spot or pimple. Sometimes there may only be one spot. At other times there may be several. Ringworm can look slightly different on different parts of the body, but there are some things are always present:   Irregular, round, oval or ring-shaped, which is why it's called ringworm   Clearer or lighter color at the center, since it spreads from the center of the spot outward   Red or inflamed look   Raised   Itchy   Scaly, dry, or flaky  Home care  Follow these tips to help care for yourself at home:   Leave it alone. Don't scratch at the rash or pick it. This can increase the chance of infection and scarring.   Take medicine as prescribed. If you were prescribed a cream, apply it exactly as directed. Make sure to put the cream not just on the  rash, but also on the skin 1 or 2 inches around it. Medicine by mouth issometimes needed, particularly for ringworm on the scalp.Take it as directed and until your healthcare provider says to stop.   Keep it from spreading to others. Untreated ringworm of the skin iscontagiousby skin-to-skin contact. Your child may return to school 2 days after treatment has started.  Prevention  To some degree, prevention depends on what part of your body was affected. In general, the following good hygiene can help.   Clean upafter you get dirty or sweaty, or after using a locker room.   When possible, don'tshare combs and brushes.   Avoid having your skin and feet wet or damp for long periods.   Wear clean, loose-fitting underwear.  Follow-up care  Follow up with your healthcare provider as advised by our staff if the rash does not improve after 10 days of treatment or if the rash spreads to other areas of the body.  When to seek medical advice  Call your healthcare provider right awayif any of these occur:   Redness around the rash gets worse   Fluid drains from the rash   Fever of100.6F (38C) or higher, or as directed by your healthcare  provider  Date Last Reviewed: 03/23/2015   2000-2016 [employer] [address] [place], [place] [address]. All rights reserved. This information is not intended as a substitute for professional medical care. Always follow your healthcare professional's instructions.

## 2015-10-27 NOTE — Progress Notes (Signed)
Have you seen any new specialists/physicians since you were last here? No          Limb alert protocol reviewed?  Yes      No changes to family, medical or surgical history per pt.

## 2015-10-27 NOTE — Progress Notes (Signed)
Subjective:      Patient ID: Christina Torres is a 50 y.o. female  Chief Complaint   Patient presents with   . Rash     Right Thigh; x3 1/2wks; wants to make sure not shingles        Rash  This is a new problem. The current episode started 1 to 4 weeks ago. The problem has been gradually improving since onset. The affected locations include the right upper leg. The rash is characterized by redness and itchiness. She was exposed to nothing. Pertinent negatives include no congestion, cough, fever or rhinorrhea. Past treatments include nothing. There is no history of eczema.     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 And soon after she noticed a rash on right thing. It was dark and looked like blister.     The following portions of the patient's history were reviewed and updated as appropriate: current medications, allergies, past family history, past medical history, past social history, past surgical history and problem list.     Review of Systems   Constitutional: Negative for fever.   HENT: Negative for congestion and rhinorrhea.    Respiratory: Negative for cough.    Skin: Positive for rash.        BP 120/74 mmHg  Pulse 91  Temp(Src) 98.6 F (37 C) (Oral)  Resp 20  Wt 81.375 kg (179 lb 6.4 oz)  SpO2 98%  LMP 10/05/2015   Objective:   Physical Exam   Constitutional: She appears well-developed and well-nourished. No distress.   Pulmonary/Chest: Effort normal.   Neurological: She is alert.   Skin: Rash (round ~4x4cm patch, slightly raised with edge more raised than center, slight erythema and hyperpigmentation) noted.        Psychiatric: She has a normal mood and affect.   Nursing note and vitals reviewed.      Assessment:     1. Ringworm  terbinafine (LAMISIL AT) 1 % cream   2. Essential hypertension           Plan:     50 y.o. female presents with rash on right thigh most consistent with tinea corporis vs less likely annular eczema. BP control good today  -trial of lamisil cream  -if  no improvement, will consider topical steroid  -counseled on infectivity and advised to keep covered  -will continue to monitor with home bp and continue HCTZ 25mg  for now.   -Risk & Benefits of the new medication(s) were explained to the patient (and family) who verbalized understanding & agreed to the treatment plan. Patient (family) encouraged to contact me/clinical staff with any questions/concerns  -counseled on signs/symptoms to warrant re-evaluation     Return for follow up current symptoms as needed.     Gust Brooms, MD

## 2015-11-02 ENCOUNTER — Encounter (INDEPENDENT_AMBULATORY_CARE_PROVIDER_SITE_OTHER): Payer: Self-pay | Admitting: Internal Medicine

## 2015-11-02 ENCOUNTER — Ambulatory Visit (INDEPENDENT_AMBULATORY_CARE_PROVIDER_SITE_OTHER): Payer: Medicare Other | Admitting: Internal Medicine

## 2015-11-02 ENCOUNTER — Telehealth (INDEPENDENT_AMBULATORY_CARE_PROVIDER_SITE_OTHER): Payer: Self-pay | Admitting: Family Medicine

## 2015-11-02 VITALS — BP 151/90 | HR 101 | Temp 99.2°F | Resp 20 | Wt 180.8 lb

## 2015-11-02 DIAGNOSIS — E871 Hypo-osmolality and hyponatremia: Secondary | ICD-10-CM

## 2015-11-02 DIAGNOSIS — I10 Essential (primary) hypertension: Secondary | ICD-10-CM

## 2015-11-02 DIAGNOSIS — F319 Bipolar disorder, unspecified: Secondary | ICD-10-CM

## 2015-11-02 LAB — BASIC METABOLIC PANEL
BUN: 9 mg/dL (ref 7.0–19.0)
CO2: 26 mEq/L (ref 21–30)
Calcium: 9 mg/dL (ref 8.5–10.5)
Chloride: 97 mEq/L — ABNORMAL LOW (ref 100–111)
Creatinine: 0.8 mg/dL (ref 0.4–1.5)
Glucose: 93 mg/dL (ref 70–100)
Potassium: 4.4 mEq/L (ref 3.5–5.3)
Sodium: 129 mEq/L — ABNORMAL LOW (ref 135–146)

## 2015-11-02 LAB — GFR: EGFR: >60

## 2015-11-02 LAB — HEMOLYSIS INDEX: Hemolysis Index: 14 (ref 0–18)

## 2015-11-02 NOTE — Progress Notes (Signed)
Have you seen any new specialists/physicians since you were last here? No          Limb alert protocol reviewed?  Yes      No changes to family, medical or surgical history per pt.

## 2015-11-02 NOTE — Progress Notes (Signed)
Subjective:      Patient ID: Christina Torres is a 50 y.o. female  Chief Complaint   Patient presents with   . Hypertension     Hospital Discharge - Care Everywhere   . low sodium     from labs drawn at [facility].        HPI  Here for hospital follow up. Admitted for obs overnight last night and discharged this morning for headache, chest pain and found to be hyponatremic. Fri night ate fried chicken, the next day bp was high, 175/106, went to [facility] and had headache on left side. Took pain medication the next morning. Didn't do anything. Then yesterday still had headache and had pain on left chest. Went to [facility] ED. Did CXR and drew blood. Thought it was hyponatremia due to HCTZ. Kept for obs overnight. Kept giving sodium. Did improve, was 125 and discharged. Encouraged sodium in diet on d/c. Was on regular diet overnight. Had more sodium than usual. Thought the chest pain was muscle spasm.   Did change antihypertensive to amlodipine. Hasn't started it yet bc she came straight here from being discharged.  Overall feels better. Chest pain resolved. Still a little bit of a headache.     The following portions of the patient's history were reviewed and updated as appropriate: current medications, allergies, past family history, past medical history, past social history, past surgical history and problem list.     Review of Systems see above      BP 151/90 mmHg  Pulse 101  Temp(Src) 99.2 F (37.3 C) (Oral)  Resp 20  Wt 82.01 kg (180 lb 12.8 oz)  SpO2 98%  LMP 10/05/2015   Objective:   Physical Exam   Constitutional: She is oriented to person, place, and time. She appears well-developed and well-nourished. No distress.   Eyes: Conjunctivae are normal.   Cardiovascular: Normal rate, regular rhythm and normal heart sounds.    Pulmonary/Chest: Effort normal and breath sounds normal.   Neurological: She is alert and oriented to person, place, and time.   Psychiatric: She has a normal mood and affect.   Nursing  note and vitals reviewed.    Reviewed notes and labs in care everywhere  Assessment:     1. Essential hypertension  Basic Metabolic Panel   2. Hyponatremia  Basic Metabolic Panel   3. Bipolar 1 disorder           Plan:     50 y.o. female with hx of HTN presents for hospital follow up for chest pain, elevated bp, hyponatremia. Hyponatremia could be multifactorial as she was on HCTZ, trileptal can also cause it, she may have been volume deplete, etc.   -encouraged to continue to limit salt in diet  -take amlodipine as prescribed  -hold hctz  -check bmp today (as significant snow expected overnight and may not be able to check tomorrow)  -if remains low, will need to consider trileptal as the cause  -monitor home bp  -counseled on signs/symptoms to warrant re-evaluation     Return in about 3 days (around 11/05/2015) for follow up current symptoms.     Gust Brooms, MD

## 2015-11-02 NOTE — Telephone Encounter (Signed)
Patient noted frequent urination returned again tonight. Was recently hospitalized for low sodium. Wants to know which of her medications is doing it. States not taking the HCTZ anymore. I told her I can't tell which if any of her medications are doing it. If it continues or if she feels symptoms similar to last time, she may need to go back to ED.

## 2015-11-02 NOTE — Patient Instructions (Signed)
Hyponatremia  Hyponatremia means low sodium levels in the blood. This condition most often occurs after prolonged vomiting or diarrhea, which causes your body to lose too much water and sodium. It can also result from drinking excess amounts of water or the use of diuretics (water pills).  Mild hyponatremia causes no symptoms. It is only discovered with a blood test. As sodium levels in the blood decreases, symptoms begin to appear. This includes weakness, confusion, muscle cramping and seizures.  Home care   Reduce your daily water intake until the problem is corrected.   If you have been taking diuretics, you may be asked to stop taking them for a short time.   If you are having symptoms of weakness or confusion, do not drive or operate dangerous machinery until symptoms resolve.  Follow-up care  Follow up with yourhealthcare providerfor a repeat blood test within the next week or as advised by our staff.  When to seek medical advice  Call your healthcare provider if any of the following occur:   Increasing weakness   Dizziness   Irregular heartbeat, extra beats or very fast heart rate   Increasing confusion   Fainting or loss of consciousness  Date Last Reviewed: 03/16/2014   2000-2016 [employer] [address], [place] [address]. All rights reserved. This information is not intended as a substitute for professional medical care. Always follow your healthcare professional's instructions.

## 2015-11-04 NOTE — Telephone Encounter (Signed)
Please call and check on pt. She was overnight in the ED and frequently patients are then given a lot of fluids, so it's not surprising that she may be urinating more than normal.     Please get her recent home bp checks as well. Sent her lab results today and advised that we should recheck sodium on Friday. Please advise her to schedule lab appt.

## 2015-11-04 NOTE — Telephone Encounter (Signed)
Called and spoke with pt , pt states Psychiatrist will be cutting down medication Trilepatal to 600mg  at night. Pt  Psychiatrist also wants her to have lab work done on the 24th of March and then she has appt 29th of March with psych.  Pt transferred to front office to schedule a lab appt.   Pt recent BP readings     BP 11/03/15 150/86 before meds ; 134/84 after meds  BP 11/04/15 138/86

## 2015-11-05 ENCOUNTER — Other Ambulatory Visit (INDEPENDENT_AMBULATORY_CARE_PROVIDER_SITE_OTHER): Payer: Self-pay

## 2015-11-05 ENCOUNTER — Ambulatory Visit (INDEPENDENT_AMBULATORY_CARE_PROVIDER_SITE_OTHER): Payer: Medicare Other | Admitting: Internal Medicine

## 2015-11-05 DIAGNOSIS — E871 Hypo-osmolality and hyponatremia: Secondary | ICD-10-CM

## 2015-11-05 NOTE — Progress Notes (Signed)
Pt has Lab Appt tmrw 11/06/15. Please place orders.

## 2015-11-06 ENCOUNTER — Encounter (INDEPENDENT_AMBULATORY_CARE_PROVIDER_SITE_OTHER): Payer: Self-pay

## 2015-11-06 ENCOUNTER — Other Ambulatory Visit (FREE_STANDING_LABORATORY_FACILITY): Payer: Medicare Other

## 2015-11-06 DIAGNOSIS — E871 Hypo-osmolality and hyponatremia: Secondary | ICD-10-CM

## 2015-11-06 LAB — HEMOLYSIS INDEX: Hemolysis Index: 3 (ref 0–18)

## 2015-11-06 LAB — BASIC METABOLIC PANEL
BUN: 9 mg/dL (ref 7.0–19.0)
CO2: 26 mEq/L (ref 21–30)
Calcium: 9.1 mg/dL (ref 8.5–10.5)
Chloride: 100 mEq/L (ref 100–111)
Creatinine: 0.8 mg/dL (ref 0.4–1.5)
Glucose: 86 mg/dL (ref 70–100)
Potassium: 4 mEq/L (ref 3.5–5.3)
Sodium: 134 mEq/L — ABNORMAL LOW (ref 135–146)

## 2015-11-06 LAB — GFR: EGFR: >60

## 2015-11-06 NOTE — Progress Notes (Signed)
Lab order placed.

## 2015-11-11 ENCOUNTER — Encounter (INDEPENDENT_AMBULATORY_CARE_PROVIDER_SITE_OTHER): Payer: Self-pay | Admitting: Internal Medicine

## 2015-11-16 MED ORDER — ATENOLOL 50 MG PO TABS
50.0000 mg | ORAL_TABLET | Freq: Every day | ORAL | Status: DC
Start: 2015-11-16 — End: 2016-01-25

## 2015-12-09 ENCOUNTER — Telehealth (INDEPENDENT_AMBULATORY_CARE_PROVIDER_SITE_OTHER): Payer: Self-pay | Admitting: Family Medicine

## 2015-12-09 NOTE — Telephone Encounter (Signed)
CALL CENTER: PT CHECKED her bp at home and it's around 150/85, she wants to take extra dose of  Her beta blocker, she is asymptomatic, I recommend not to do extra dose as bp is not significantly high enough.  I do recommend to make an appointment for follow up with pcp within next week.

## 2015-12-10 NOTE — Telephone Encounter (Signed)
Pt called back and stated she had a reading of 140s/80s. Pt stated she went to [facility] ED on 12/08/15, due to feeling dehydrated. Pt was instructed to drink more H2O. Pt has been drinking 8 glasses of H2O and urinating frequently. Pt stated she believes her psych meds need to be adjusted, therefore, she has placed call to psych. Advised pt to send mychart message with BP readings to PCP. Stressed importance of NOT taking additional BP medication unless advised to do so by PCP. Once PCP receives updated BP readings, if medication needs to be adjusted, then PCP will advise. Scheduled pt with PCP for f/u on 12/22/15 at 2:40pm. Pt verbalized understanding.

## 2015-12-10 NOTE — Telephone Encounter (Signed)
Called pt and LM requesting CB regarding slightly elevated BP reading.

## 2015-12-10 NOTE — Telephone Encounter (Signed)
Please call pt to check on her and see what her bp has been. She should follow up in the next week or so.

## 2015-12-17 ENCOUNTER — Encounter (INDEPENDENT_AMBULATORY_CARE_PROVIDER_SITE_OTHER): Payer: Self-pay | Admitting: Internal Medicine

## 2015-12-22 ENCOUNTER — Encounter (INDEPENDENT_AMBULATORY_CARE_PROVIDER_SITE_OTHER): Payer: Self-pay | Admitting: Internal Medicine

## 2015-12-22 ENCOUNTER — Ambulatory Visit (INDEPENDENT_AMBULATORY_CARE_PROVIDER_SITE_OTHER): Payer: Medicare Other | Admitting: Internal Medicine

## 2015-12-22 VITALS — BP 146/81 | HR 73 | Temp 98.4°F | Resp 16 | Ht 64.0 in | Wt 180.8 lb

## 2015-12-22 DIAGNOSIS — I10 Essential (primary) hypertension: Secondary | ICD-10-CM

## 2015-12-22 MED ORDER — AMLODIPINE BESYLATE 10 MG PO TABS
10.0000 mg | ORAL_TABLET | Freq: Every day | ORAL | Status: DC
Start: 2015-12-22 — End: 2016-03-18

## 2015-12-22 NOTE — Patient Instructions (Signed)
Controlling High Blood Pressure  High blood pressure (hypertension) is often called the silent killer. This is because many people who have it don't know it. High blood pressure is defined as 140/90 mm Hg or higher.Know your blood pressure and remember to check it regularly. Doing so can save your life. Here are some things you can do to help control your blood pressure.    Choose heart-healthy foods   Select low-salt, low-fat foods. Limit sodium intake to 2,400 mg per day or the amount suggested by your healthcare provider.   Limit canned, dried, cured, packaged, and fast foods. These can contain a lot of salt.   Eat 8 to 10 servings of fruits and vegetables every day.   Choose lean meats, fish, or chicken.   Eat whole-grain pasta, brown rice, and beans.   Eat 2 to 3 servings of low-fat or fat-free dairy products.   Ask your doctor about the DASH eating plan. This plan helps reduce blood pressure.   When you go to a restaurant, ask that your meal be prepared with no added salt.  Maintain a healthy weight   Ask your healthcare provider how many calories to eat a day. Then stick to that number.   Ask your healthcare provider what weight range is healthiest for you. If you are overweight, a weight loss of only 3% to 5% of your body weightcan help lower blood pressure. Generally, a good weight loss goal is to lose 10% of your body weight in a year.   Limit snacks and sweets.   Get regular exercise.  Get up and get active   Choose activities you enjoy. Find ones you can do with friends or family. This includes bicycling, dancing, walking, and jogging.   Park farther away from building entrances.   Use stairs instead of the elevator.   When you can, walk or bike instead of driving.   Rake leaves, garden, or do household repairs.   Be active at a moderate to vigorous level of physical activity for at least 40 minutes for a minimum of 3 to 4 days a week.  Manage stress   Make time to relax and enjoy  life. Find time to laugh.   Communicate your concerns with your loved ones and your healthcare provider.   Visit with family and friends, and keep up with hobbies.  Limit alcohol and quit smoking   Men should have no more than 2 drinks per day.   Women should have no more than 1 drink per day.   Talk with your healthcare provider about quitting smoking. Smoking significantly increases your risk for heart disease and stroke. Ask your healthcare provider about community smoking cessation programs and other options.  Medicines  If lifestyle changes aren't enough, your healthcare provider may prescribe high blood pressure medicine. Take all medicines as prescribed. If you have any questions about your medicines, ask your healthcare provider before stopping or changing them.   Date Last Reviewed: 12/17/2014   2000-2016 [employer] [address], [place] [address]. All rights reserved. This information is not intended as a substitute for professional medical care. Always follow your healthcare professional's instructions.

## 2015-12-22 NOTE — Progress Notes (Signed)
Nursing Documentation:  Limb alert status: Patient asked and denied any limb restrictions for blood pressure/blood draws.  Has the patient seen any other providers since their last visit: no  The patient is due for nothing at this time, HM is up-to-date.

## 2015-12-22 NOTE — Progress Notes (Signed)
Subjective:      Patient ID: Christina Torres is a 50 y.o. female  Chief Complaint   Patient presents with   . Hypertension     Follow up on HTN. Takes medication as prescribed. Takes bp at home; checks bp twice a day;has bp log.         HPI    Problem   Essential Hypertension    Diagnosed around 2013 by previous PCP. At the time said it was 'mild'. Does have family hx of HTN.   Previous PCP started pt on and continues HCTZ qd (was 12.5, increased to 25mg  04/2015).  Had hyponatremia and was taken off HCTZ. Tries to watch salt, not exercising, not following formal diet.   Since last visit, started on amlodipine and increased to 10mg . BP still not optimally controlled so via mychart, advised to add atenolol 50mg  and subsequently increased to 100mg .   Here today and reports stopping amlodipine, didn't realize she was supposed to add atenlolol.     BP Readings from Last 3 Encounters:   12/22/15 146/81   11/02/15 151/90   10/27/15 120/74               The following portions of the patient's history were reviewed and updated as appropriate: current medications, allergies, past family history, past medical history, past social history, past surgical history and problem list.     Review of Systems   Constitutional: Negative.    Respiratory: Negative.    Cardiovascular: Negative.         BP 146/88 mmHg  Pulse 72  Temp(Src) 98.4 F (36.9 C) (Oral)  Resp 16  Ht 1.626 m (5\' 4" )  Wt 82.01 kg (180 lb 12.8 oz)  BMI 31.02 kg/m2  SpO2 98%  LMP 12/18/2015   Objective:   Physical Exam   Constitutional: She is oriented to person, place, and time. She appears well-developed and well-nourished. No distress.   Eyes: Conjunctivae are normal.   Cardiovascular: Normal rate, regular rhythm and normal heart sounds.    Pulmonary/Chest: Effort normal and breath sounds normal.   Neurological: She is alert and oriented to person, place, and time.   Psychiatric: She has a normal mood and affect.   Nursing note and vitals  reviewed.      Assessment:     1. Essential hypertension  amLODIPine (NORVASC) 10 MG tablet         Plan:     50 y.o. female with uncontrolled HTN.   -resume amlodipine 10mg   -go back to atenolol 50mg  daily  -encouraged to continue to limit salt in diet  -monitor home bp  -counseled on signs/symptoms to warrant re-evaluation     Return in about 4 weeks (around 01/19/2016) for follow up bp.     Gust Brooms, MD

## 2015-12-23 ENCOUNTER — Encounter (INDEPENDENT_AMBULATORY_CARE_PROVIDER_SITE_OTHER): Payer: Self-pay | Admitting: Internal Medicine

## 2015-12-27 ENCOUNTER — Encounter (INDEPENDENT_AMBULATORY_CARE_PROVIDER_SITE_OTHER): Payer: Self-pay | Admitting: Internal Medicine

## 2015-12-27 NOTE — Progress Notes (Signed)
Pt called at 2 am to report that she thinks that her bp might be elevated because she feels a slight pressure in her head.  Pt does not have a bp monitor at home. Pt is on amlodipine 10 and atenolol 50.  Pt wanted to take more medication.  Advised pt to lay down and relax, do not double up on med, and if symptoms continue go to er or same day walk in in the morning.

## 2015-12-28 ENCOUNTER — Encounter (INDEPENDENT_AMBULATORY_CARE_PROVIDER_SITE_OTHER): Payer: Self-pay | Admitting: Family

## 2015-12-28 ENCOUNTER — Ambulatory Visit (INDEPENDENT_AMBULATORY_CARE_PROVIDER_SITE_OTHER): Payer: Medicare Other | Admitting: Family

## 2015-12-28 ENCOUNTER — Encounter (INDEPENDENT_AMBULATORY_CARE_PROVIDER_SITE_OTHER): Payer: Self-pay | Admitting: Internal Medicine

## 2015-12-28 VITALS — BP 127/72 | HR 80 | Temp 98.7°F | Resp 16 | Ht 64.0 in | Wt 179.4 lb

## 2015-12-28 DIAGNOSIS — R519 Headache, unspecified: Secondary | ICD-10-CM

## 2015-12-28 DIAGNOSIS — R51 Headache: Secondary | ICD-10-CM

## 2015-12-28 DIAGNOSIS — R079 Chest pain, unspecified: Secondary | ICD-10-CM

## 2015-12-28 DIAGNOSIS — R7301 Impaired fasting glucose: Secondary | ICD-10-CM

## 2015-12-28 DIAGNOSIS — I10 Essential (primary) hypertension: Secondary | ICD-10-CM

## 2015-12-28 NOTE — Patient Instructions (Addendum)
Medicines for Acid Reflux  Your healthcare provider has told you that you have acid reflux. This condition causes stomach acid to wash up into your throat. For most people, acid reflux is troubling but not dangerous. But left untreated, acid reflux sometimes damages the esophagus. Medicines can help control acid reflux and limit your risk of future problems.  Medicines for acid reflux  Your healthcare provider may prescribe medicine to help treat your acid reflux. Medicine will be based on your symptoms and any test results. Your provider will explain how to take your medicine. You will also be told about possible side effects.  Reducing stomach acid  Your provider may suggest antacids that you can buy over the counter. Antacids can give fast relief.Or you may be told to take a type of medicine called H2 blockers. These are available over the counter and by prescription (for higher doses).  Blocking stomach acid  In more severe cases, your healthcare provider may suggest stronger medicines such as protonpump inhibitors (PPIs). These keep the stomach from making acid.They are oftenprescribed for long-term use.  Other medicines  In some cases medicines to reduce or block stomach acid may not work. Then you may be switched to another type of medicine that helps your stomach empty better.    Date Last Reviewed: 05/23/2015   2000-2016 [employer] [address], [place], [place] [address]. All rights reserved. This information is not intended as a substitute for professional medical care. Always follow your healthcare professional's instructions.      Start a trial prilosec OTC for 3 weeks and see if symptoms improve. If symptoms return after stopping f/u.     Headache, Unspecified    A number of things can cause headaches. The cause of your headache isn't clear. But it doesn't seem to be a sign of any serious illness.  You could have a tension headache or a migraine headache.  Stress can cause a  tension headache. This can happen if you tense the muscles of your shoulders, neck, and scalp without knowing it. If this stress lasts long enough, you may develop a tension headache.  It is not clear why migraines occur, but certain things called" triggers" can raise the risk of having a migraine attack. Migraine triggers may include emotional stress or depression, or by hormone changes during the menstrual cycle. Other triggers include birth control pills and other medicines, alcohol or caffeine, foods with tyramine (such as aged cheese, wine), eyestrain, weather changes, missed meals, and lack of sleep or oversleeping.  Other causes of headache include:   Viral illness with high fever   Head injury with concussion   Sinus, ear, or throat infection   Dental pain and jaw joint (TMJ) pain  More serious but less common causes of headache include stroke, brain hemorrhage, brain tumor, meningitis, and encephalitis.  Home care  Follow these tips when taking care of yourself at home:   Don't drive yourself home if you were given pain medicine for your headache. Instead, have someone else drive you home. Try to sleep when you get home. You should feel much better when you wake up.   Apply heat to the back of your neck to ease a neck muscle spasm. Take care of a migraine headache by putting an ice pack on your forehead or at the base of your skull.   If you have nausea or vomiting, eat a light diet until your headache eases.   If you have  a migraine headache, use sunglasses when in the daylight or around bright indoor lighting until your symptoms get better. Bright glaring light can make this type of headache worse.  Follow-up care  Follow up with your healthcare provider, or as advised. Talk with your provider if you have frequent headaches. He or she can help figure out a treatment plan. By knowing the earliest signs of headache, and starting treatment right away, you may be able to stop the pain yourself.  When  to seek medical advice  Call your healthcare provider right awayif any of these occur:   Your head pain suddenly gets worse after sexual intercourse or strenuous activity   Your head pain doesn't get better within 24 hours   You aren't able to keep liquids down (repeated vomiting)   Fever of 100.1F (38C) or higher, or as directed by your healthcare provider   Stiff neck   Extreme drowsiness, confusion, or fainting   Dizziness or dizziness with spinning sensation (vertigo)   Weakness in an arm or leg or one side of your face   You have trouble talking or seeing  Date Last Reviewed: 03/23/2015   2000-2016 [employer] [address], [place], [place] [address]. All rights reserved. This information is not intended as a substitute for professional medical care. Always follow your healthcare professional's instructions.        Uncertain Causes of Chest Pain    Chest pain can happen for a number of reasons. Sometimes the cause can't be determined. If yourcondition does not seem serious, and your pain does not appear to be coming from your heart, your healthcare provider may recommend watching it closely. Sometimes the signs of a serious problem take more time to appear. Many problems not related to your heart can cause chest pain.These include:   Musculoskeletal. Costochondritis, an inflammation of the tissues around the ribs that can occur from trauma or overuse injuries   Respiratory. Pneumonia, pneumothorax, or pneumonitis (inflammation of the lining of the chest and lungs)   Gastrointestinal. Esophageal reflux, heartburn, or gallbladder disease   Anxiety and panic disorders   Nerve compression and neuritis   Miscellaneous problems such as aortic aneurysm or pulmonary embolism (a blood clot in the lungs)  Home care  After your visit, follow these recommendations:   Rest today and avoid strenuous activity.   Take any prescribed medicine as directed.   Be aware of any recurrent chest  pain and notice any changes  Follow-up care  Follow up with your healthcare provider if you do not start to feel better within 24 hours, or as advised.  Call 911  Call 911 if any of these occur:   A change in the type of pain: if it feels different, becomes more severe, lasts longer, or begins to spread into your shoulder, arm, neck, jaw or back   Shortness of breath or increased pain with breathing   Weakness, dizziness, or fainting   Rapid heart beat   Crushing sensation in your chest  When to seek medical advice  Call your healthcare provider right away if any of the following occur:   Cough with dark colored sputum (phlegm) or blood   Fever of 100.1F(38C) or higher, or as directed by your healthcare provider   Swelling, pain or redness in one leg   Shortness of breath  Date Last Reviewed: 08/20/2014   2000-2016 [employer] [address], [place], [place] [address]. All rights reserved. This  information is not intended as a substitute for professional medical care. Always follow your healthcare professional's instructions.

## 2015-12-28 NOTE — Progress Notes (Signed)
Pt telephoned at 1 am to report chest pain, and edema.  Told pt to call 911 as she needs to be evaluated by ems and taken to the er asap

## 2015-12-28 NOTE — Progress Notes (Signed)
[facility] [place]    PROGRESS NOTE      Patient: Christina Torres   Date: 12/30/2015   MRN: [id number]     Past Medical History   Diagnosis Date   . Hypertension    . Bipolar 1 disorder    . Fibroid tumor    . Aneurysm      2 aneurysm each side of nose. monitored closely   . Headache    . Eczema    . Anemia      PMH     Social History     Social History   . Marital Status: Single     Spouse Name: N/A   . Number of Children: N/A   . Years of Education: N/A     Occupational History   . Not on file.     Social History Main Topics   . Smoking status: Never Smoker    . Smokeless tobacco: Never Used   . Alcohol Use: No   . Drug Use: No   . Sexual Activity:     Partners: Male     Other Topics Concern   . Not on file     Social History Narrative     Family History   Problem Relation Age of Onset   . Cancer Mother      colon   . Hypertension Mother    . Hypertension Father    . Diabetes Father    . Heart failure Father            ASSESSMENT/PLAN     Christina Torres is a 50 y.o. female    Chief Complaint   Patient presents with   . Chest Pain     Follow up [facility] ER visit today for chest pain and headache. Patient started experiencing a headache and chest pain 3 days ago. Patient took Tums starting on Saturday with no relief. Chest pain and headache constant. Left sided chest pain. EKG and blood work done. EKG normal. Glucose high 113. Patient is starting to feel better. Patient is still having a headache. Pain is 2/10. Pain is sharp and throbbing.         1. Chest pain, unspecified type  - Lipid panel; Future    Testing all normal in ER this morning. Likely related to GERD per hx. Recommend starting trial of prilosec OTC and f/u within 1 month. If chest pain persist would refer to Cardiologist for further evaluation if chest pain severe or worsening go to ER. F/u 1 month     2. Intractable headache, unspecified chronicity pattern, unspecified headache type  Treat with rest, increase fluids, ibuprofen and tylenol. If  headache persist f/u.     3. Elevated fasting glucose  - Hemoglobin A1C; Future  - Lipid panel; Future  Will check Ha1c due to elevated fasting glucose.    4. Essential hypertension  - Lipid panel; Future    Current blood pressure is adequately controlled on the current medication regimen. Continue to optimize low salt diet and aerobic exercise efforts. and Continue current medication regimen. Recommend optimizing therapeutic lifestyle changes which include obtaining at least 150 minutes of aerobic exercise per week and eating a heart healthy diet ( i.e. DASH diet - www.heart.org or PopSteam.is ).  Recommend checking ambulatory blood pressures once or twice a day and document in a log.  Bring the log and blood pressure cuff into the office at the next follow-up visit or utilize MyChart to  communicate BP readings every 2-4 weeks. f/u 1 month.    Pt has not had lipids checked so will get fasting lipid to assess risk factor.            Risk & Benefits of the new medication(s) if started at this visit, were explained to the patient (and family) who verbalized understanding & they are in agreement to the treatment plan.     Patient (family) encouraged to contact me/clinical staff with any questions/concerns    MEDICATIONS     Current Outpatient Prescriptions   Medication Sig Dispense Refill   . amLODIPine (NORVASC) 10 MG tablet Take 1 tablet (10 mg total) by mouth daily. 90 tablet 0   . asenapine Maleate (SAPHRIS) 10 MG SL Tab Place 10 mg under the tongue nightly.        [name] atenolol (TENORMIN) 50 MG tablet Take 1 tablet (50 mg total) by mouth daily. (Patient taking differently: Take 100 mg by mouth daily.   ) 90 tablet 0   . ibuprofen (ADVIL,MOTRIN) 200 MG tablet Take 3 tablets (600 mg total) by mouth every 6 (six) hours as needed for Pain.  0   . OXcarbazepine (TRILEPTAL) 600 MG tablet Take 600 mg by mouth. In the morning       . OXcarbazepine (TRILEPTAL) 600 MG tablet Take 600 mg by mouth nightly.        .  QUEtiapine (SEROQUEL) 400 MG tablet Take 800 mg by mouth nightly.       . traZODone (DESYREL) 100 MG tablet Take 100 mg by mouth nightly.        . terbinafine (LAMISIL AT) 1 % cream Apply topically 2 (two) times daily. (Patient taking differently: Apply topically daily.   ) 42 g 0     No current facility-administered medications for this visit.       Allergies   Allergen Reactions   . Penicillins Angioedema   . Clindamycin Rash   . Lamictal [Lamotrigine] Rash       SUBJECTIVE     Chief Complaint   Patient presents with   . Chest Pain     Follow up [facility] ER visit today for chest pain and headache. Patient started experiencing a headache and chest pain 3 days ago. Patient took Tums starting on Saturday with no relief. Chest pain and headache constant. Left sided chest pain. EKG and blood work done. EKG normal. Glucose high 113. Patient is starting to feel better. Patient is still having a headache. Pain is 2/10. Pain is sharp and throbbing.         Chest Pain   This is a new problem. The current episode started in the past 7 days (3 days ago). The onset quality is sudden (when she laided down). The patient is experiencing no pain (no pain currently. pain rated 5/10 when it occurs. pt reports chest pain did seem slightly better after tums but then returned. ). The quality of the pain is described as sharp. The pain does not radiate. Associated symptoms include headaches and lower extremity edema. Pertinent negatives include no back pain, cough, diaphoresis, dizziness, exertional chest pressure, fever, malaise/fatigue, nausea, numbness, palpitations, shortness of breath, syncope, vomiting or weakness. Associated symptoms comments: Pt reports swelling in ankle for about a week but only after she eats fatty/sweet/salty foot and thinks it is related to this. Pt does work on her feet. Pt has also had a new shoe that is not as comfortable. . Associated with: laying  Treatments tried: tums. The treatment provided mild  relief. Risk factors include lack of exercise.   Pertinent negatives for family medical history include: no sudden death (grandmother in 57's with sudden heart attack and stroke. ).       Pt is here today to follow up [facility] ER visit today for chest pain and headache.     Patient started experiencing a headache and chest pain 3 days ago. Patient took Tums starting on Saturday with no relief. Chest pain and headache constant.  Chest pain is left sided. EKG and blood work done. EKG/trops normal. Glucose high 113 and pt reports she was fasting. No hx of diabetes. Patient is starting to feel better. Patient is still having a headache. Pain is 2/10. Pain is sharp and throbbing. Pt has not tried anything for headache. Pt has 2 aneurysms behind both eyes and follows with neurologist. Pt denies any new stress or anxiety. Pt did see cardiologist year and half ago and stress test done and normal. Pt was told to eat cooked vegetables.     PMH: HTN - pt started on Norvasc in April 2017 and has had some mild lower extremity swelling since starting but not bothersome.    Social: pt works as a Radio broadcast assistant. Pt reports low stress job.      ROS     Review of Systems   Constitutional: Negative for fever, chills, malaise/fatigue, diaphoresis, activity change, appetite change and fatigue.   Eyes: Negative for visual disturbance.   Respiratory: Negative for cough, chest tightness, shortness of breath and wheezing.    Cardiovascular: Positive for chest pain. Negative for palpitations, leg swelling and syncope.   Gastrointestinal: Negative for nausea and vomiting.        Acid reflex after eating/lying down   Musculoskeletal: Negative for back pain.   Skin: Negative for color change, pallor, rash and wound.   Neurological: Positive for headaches. Negative for dizziness, tremors, syncope, facial asymmetry, speech difficulty, weakness, light-headedness and numbness.   Psychiatric/Behavioral: The patient is not nervous/anxious.             The following portions of the patient's history were reviewed and updated as appropriate: Allergies, Current Medications, Past Family History, Past Medical history, Past social history, Past surgical history, and Problem List.      PHYSICAL EXAM       Filed Vitals:    12/28/15 1520   BP: 127/72   Pulse: 80   Temp: 98.7 F (37.1 C)   TempSrc: Oral   Resp: 16   Height: 1.626 m (5\' 4" )   Weight: 81.375 kg (179 lb 6.4 oz)   SpO2: 98%       Physical Exam   Nursing note and vitals reviewed.  Constitutional: She is oriented to person, place, and time. She appears well-developed and well-nourished. No distress.   HENT:   Head: Normocephalic and atraumatic.   Eyes: Conjunctivae and EOM are normal. Pupils are equal, round, and reactive to light.   Neck: Normal range of motion. Neck supple. No thyromegaly present.   Cardiovascular: Normal rate, regular rhythm, S1 normal, S2 normal, normal heart sounds and normal pulses.  Exam reveals no gallop and no friction rub.    No murmur heard.  Pulmonary/Chest: Effort normal and breath sounds normal. No respiratory distress. She has no wheezes. She has no rales. She exhibits no tenderness.   Musculoskeletal: Normal range of motion. She exhibits edema (1+ biilatearl ankles- non-pitting).   Neurological: She is alert  and oriented to person, place, and time. No cranial nerve deficit. Coordination normal.   Skin: Skin is warm and dry. No rash noted. She is not diaphoretic.   Psychiatric: She has a normal mood and affect. Her behavior is normal. Judgment and thought content normal.               Results for orders placed or performed in visit on 11/06/15   Basic Metabolic Panel   Result Value Ref Range    Glucose 86 70 - 100 mg/dL    BUN 9.0 7.0 - 63.8 mg/dL    Creatinine 0.8 0.4 - 1.5 mg/dL    Calcium 9.1 8.5 - 75.6 mg/dL    Sodium 433 (L) 295 - 146 mEq/L    Potassium 4.0 3.5 - 5.3 mEq/L    Chloride 100 100 - 111 mEq/L    CO2 26 21 - 30 mEq/L   Hemolysis index   Result Value Ref  Range    Hemolysis Index 3 0 - 18   GFR   Result Value Ref Range    EGFR >60.0        Signed,  Balinda Quails, FNP-c  12/30/2015

## 2015-12-28 NOTE — Progress Notes (Signed)
Nursing Documentation:  Limb alert status: Patient asked and denied any limb restrictions for blood pressure/blood draws.  Has the patient seen any other providers since their last visit: [facility]  The patient is due for nothing at this time, HM is up-to-date.

## 2016-01-07 ENCOUNTER — Other Ambulatory Visit (FREE_STANDING_LABORATORY_FACILITY): Payer: Medicare Other

## 2016-01-07 DIAGNOSIS — R079 Chest pain, unspecified: Secondary | ICD-10-CM

## 2016-01-07 DIAGNOSIS — R7301 Impaired fasting glucose: Secondary | ICD-10-CM

## 2016-01-07 DIAGNOSIS — I10 Essential (primary) hypertension: Secondary | ICD-10-CM

## 2016-01-07 LAB — LIPID PANEL
Cholesterol / HDL Ratio: 2.9
Cholesterol: 181 mg/dL (ref 0–199)
HDL: 62 mg/dL (ref 40–9999)
LDL Calculated: 101 mg/dL — ABNORMAL HIGH (ref 0–99)
Triglycerides: 89 mg/dL (ref 34–149)
VLDL Calculated: 18 mg/dL (ref 10–40)

## 2016-01-07 LAB — HEMOLYSIS INDEX: Hemolysis Index: 3 (ref 0–18)

## 2016-01-07 LAB — HEMOGLOBIN A1C
Average Estimated Glucose: 111.2 mg/dL
Hemoglobin A1C: 5.5 % (ref 4.6–5.9)

## 2016-01-13 ENCOUNTER — Encounter (INDEPENDENT_AMBULATORY_CARE_PROVIDER_SITE_OTHER): Payer: Self-pay | Admitting: Internal Medicine

## 2016-01-21 DIAGNOSIS — G4733 Obstructive sleep apnea (adult) (pediatric): Secondary | ICD-10-CM

## 2016-01-21 HISTORY — DX: Obstructive sleep apnea (adult) (pediatric): G47.33

## 2016-01-25 ENCOUNTER — Encounter (INDEPENDENT_AMBULATORY_CARE_PROVIDER_SITE_OTHER): Payer: Self-pay | Admitting: Internal Medicine

## 2016-01-25 ENCOUNTER — Ambulatory Visit (INDEPENDENT_AMBULATORY_CARE_PROVIDER_SITE_OTHER): Payer: Medicare Other | Admitting: Internal Medicine

## 2016-01-25 VITALS — BP 133/76 | HR 86 | Temp 98.4°F | Resp 20 | Wt 184.4 lb

## 2016-01-25 DIAGNOSIS — K219 Gastro-esophageal reflux disease without esophagitis: Secondary | ICD-10-CM

## 2016-01-25 DIAGNOSIS — R0683 Snoring: Secondary | ICD-10-CM

## 2016-01-25 DIAGNOSIS — I10 Essential (primary) hypertension: Secondary | ICD-10-CM

## 2016-01-25 MED ORDER — ATENOLOL 100 MG PO TABS
100.0000 mg | ORAL_TABLET | Freq: Every day | ORAL | Status: DC
Start: 2016-01-25 — End: 2016-07-29

## 2016-01-25 NOTE — Progress Notes (Signed)
Have you seen any specialists/other providers since your last visit with [place]?    No      Limb alert protocol reviewed?      No      No changes to family, medical or surgical history per pt.

## 2016-01-25 NOTE — Patient Instructions (Signed)
Lifestyle Changes for ControllingGERD    When you have GERD, stomach acid feels as if it's backing up toward your mouth. Whether or not you take medicine to control your GERD, your symptoms can often be improved with lifestyle changes. Talk to your healthcare provider about the following suggestions. These suggestions may help you get relief from your symptoms.  Raise your head  Reflux is more likely to strike when you're lying down flat, because stomach fluid can flow backward more easily. Raising the head of your bed4 to 6 inches can help. To do this:   Slide blocks or books under the legs at the head of your bed. Or, place a wedge under the mattress. Many foam stores can make a suitable wedge for you. The wedge should run from your waist to the top of your head.   Don't just prop your head on several pillows. This increases pressure on your stomach. It can make GERD worse.  Watch your eating habits  Certain foods may increase the acid in your stomach or relax the lower esophageal sphincter. This makes GERD more likely. It's best to avoid the following if they cause you symptoms:   Coffee, tea, and carbonated drinks (with and without caffeine)   Fatty, fried, or spicy food   Mint, chocolate, onions, and tomatoes   Peppermint   Any other foods that seem to irritate your stomach or cause you pain  Relieve the pressure  Tips include the following:   Eat smaller meals, even if you have to eat more often.   Don't lie down right after you eat. Wait a few hours for your stomach to empty.   Avoid tight belts and tight-fitting clothes.   Lose excess weight.  Tobacco and alcohol  Avoid smoking tobacco and drinking alcohol. They can make GERD symptoms worse.  Date Last Reviewed: 02/20/2015   2000-2016 [employer] [address], [place] [address]. All rights reserved. This information is not intended as a substitute for professional medical care. Always follow your healthcare  professional's instructions.

## 2016-01-25 NOTE — Progress Notes (Signed)
Subjective:      Patient ID: Christina Torres is a 50 y.o. female  Chief Complaint   Patient presents with   . Hypertension     f/u   . Gastroesophageal Reflux     f/u; took 2 wk trial of Prilosec, wanting to know if okay to take Nexium QD   . Sleeping Problem     snores and wakes up throughout night; would like to get sleep study done        HPI   Here to discuss the following:     Snoring:   Snores and wakes up at night gasping for air. Wondering if she should have a sleep study. Does feel tired during the day. Has been going on for a long time, months. Never been evaluated before.     GERD:   Following up from previous eval for chest pain. Was treating with OTC prilosec x 2 weeks and helped significantly. Read that she shouldn't continue with prilosec, so would like to try nexium instead. Not sure what else she can do to help.     Problem   Essential Hypertension    Diagnosed around 2013 by previous PCP. At the time said it was 'mild'. Does have family hx of HTN.   Previous PCP started pt on and continues HCTZ qd (was 12.5, increased to 25mg  04/2015).  Had hyponatremia and was taken off HCTZ. Tries to watch salt, not exercising, not following formal diet.   Since last visit, started on amlodipine and increased to 10mg . BP still not optimally controlled so via mychart, advised to add atenolol 50mg  and subsequently increased to 100mg .   Has been well controlled lately.     BP Readings from Last 3 Encounters:   01/25/16 133/76   12/28/15 127/72   12/22/15 146/81                 The following portions of the patient's history were reviewed and updated as appropriate: current medications, allergies, past family history, past medical history, past social history, past surgical history and problem list.     Review of Systems see above      BP 133/76 mmHg  Pulse 86  Temp(Src) 98.4 F (36.9 C) (Oral)  Resp 20  Wt 83.643 kg (184 lb 6.4 oz)  SpO2 99%  LMP 01/23/2016   Objective:   Physical Exam   Constitutional: She is  oriented to person, place, and time. She appears well-developed and well-nourished. No distress.   Eyes: Conjunctivae are normal.   Cardiovascular: Normal rate, regular rhythm and normal heart sounds.    Pulmonary/Chest: Effort normal and breath sounds normal.   Neurological: She is alert and oriented to person, place, and time.   Psychiatric: She has a normal mood and affect.   Nursing note and vitals reviewed.      Assessment:     1. Essential hypertension  atenolol (TENORMIN) 100 MG tablet    Ambulatory referral to Sleep Studies   2. Snoring  Ambulatory referral to Sleep Studies   3. Gastroesophageal reflux disease, esophagitis presence not specified           Plan:     50 y.o. female presents for follow up.     1. HTN: well controlled   -continue amlodipine 10mg , atenolol 50mg  daily  -encouraged to continue to limit salt in diet  -monitor home bp  -counseled on signs/symptoms to warrant re-evaluation   Elevated BMI: Body mass index is 31.64  kg/(m^2).  -HIgh BMI Follow Up  BMI Follow Up Care Plan Documented  Encouragement to Exercise  Nutrition and Physical Activity Counseling:  Nutrition Counseling:  Food education, guidance and counseling  Physical Activity Counseling  Patient advised about exercise    2. Snoring: HTN, could be signs of OSA  -refer for sleep study    3. GERD: treatment improved chest pain  -continue PPI x 1 month then try stopping  -counseled at length on diet, lifestyle changes    Return in about 3 months (around 04/26/2016) for follow up bp.     Gust Brooms, MD

## 2016-02-02 ENCOUNTER — Encounter (INDEPENDENT_AMBULATORY_CARE_PROVIDER_SITE_OTHER): Payer: Self-pay | Admitting: Internal Medicine

## 2016-02-05 ENCOUNTER — Encounter (INDEPENDENT_AMBULATORY_CARE_PROVIDER_SITE_OTHER): Payer: Self-pay | Admitting: Family Medicine

## 2016-02-05 ENCOUNTER — Encounter (INDEPENDENT_AMBULATORY_CARE_PROVIDER_SITE_OTHER): Payer: Self-pay | Admitting: Internal Medicine

## 2016-02-10 ENCOUNTER — Telehealth (INDEPENDENT_AMBULATORY_CARE_PROVIDER_SITE_OTHER): Payer: Self-pay

## 2016-02-10 NOTE — Telephone Encounter (Signed)
Message      Please ask pt if sleep medicine doctor has discussed findings of study with her.           Called patient regarding Dr. Electa Sniff note.  No answer.  Left detailed VM.  Awaiting call back/My Chart message.

## 2016-02-18 ENCOUNTER — Telehealth (INDEPENDENT_AMBULATORY_CARE_PROVIDER_SITE_OTHER): Payer: Self-pay | Admitting: Family Medicine

## 2016-02-18 NOTE — Telephone Encounter (Signed)
Patient is called regarding edema of her ankles since she has been on the amlodipine (one month)   Per chart 12/22/2015     She is concerned about fluid around her heart    She has no  orthopnea, no shortness of breath     Recommend follow up with dr. Barbee Cough

## 2016-02-23 ENCOUNTER — Telehealth (INDEPENDENT_AMBULATORY_CARE_PROVIDER_SITE_OTHER): Payer: Self-pay | Admitting: Family Medicine

## 2016-02-23 NOTE — Telephone Encounter (Signed)
Patient called at 1 am regarding her medication and if the diuretic could cause fluid arround the hear . She told me that she has light swelling around her legs . Discussed with her how diuretics work and that it would not cause fluid around the heart . Patient was happy with this explanation .  Followed up in the mooring and she was doing fine .

## 2016-02-26 ENCOUNTER — Ambulatory Visit (INDEPENDENT_AMBULATORY_CARE_PROVIDER_SITE_OTHER): Payer: Medicare Other | Admitting: Family

## 2016-03-01 ENCOUNTER — Ambulatory Visit (INDEPENDENT_AMBULATORY_CARE_PROVIDER_SITE_OTHER): Payer: Medicare Other | Admitting: Family

## 2016-03-01 ENCOUNTER — Encounter (INDEPENDENT_AMBULATORY_CARE_PROVIDER_SITE_OTHER): Payer: Self-pay | Admitting: Family

## 2016-03-01 VITALS — BP 115/69 | HR 85 | Temp 98.8°F | Resp 16 | Ht 64.0 in | Wt 190.2 lb

## 2016-03-01 DIAGNOSIS — N63 Unspecified lump in breast: Secondary | ICD-10-CM

## 2016-03-01 DIAGNOSIS — I10 Essential (primary) hypertension: Secondary | ICD-10-CM

## 2016-03-01 DIAGNOSIS — N6310 Unspecified lump in the right breast, unspecified quadrant: Secondary | ICD-10-CM

## 2016-03-01 MED ORDER — AMLODIPINE BESYLATE 5 MG PO TABS
5.0000 mg | ORAL_TABLET | Freq: Every day | ORAL | Status: DC
Start: 2016-03-01 — End: 2016-03-28

## 2016-03-01 NOTE — Progress Notes (Signed)
Nursing Documentation:  Limb alert status: Patient asked and denied any limb restrictions for blood pressure/blood draws.  Has the patient seen any other providers since their last visit: [facility]  The patient is due for nothing at this time, HM is up-to-date.

## 2016-03-01 NOTE — Progress Notes (Signed)
[facility]    PROGRESS NOTE      Patient: Christina Torres   Date: 03/01/2016   MRN: [id number]     Past Medical History   Diagnosis Date   . Hypertension    . Bipolar 1 disorder    . Fibroid tumor    . Aneurysm      2 aneurysm each side of nose. monitored closely   . Headache    . Eczema    . Anemia      PMH   . OSA (obstructive sleep apnea) 01/2016     Social History     Social History   . Marital Status: Single     Spouse Name: N/A   . Number of Children: N/A   . Years of Education: N/A     Occupational History   . Not on file.     Social History Main Topics   . Smoking status: Never Smoker    . Smokeless tobacco: Never Used   . Alcohol Use: No   . Drug Use: No   . Sexual Activity:     Partners: Male     Other Topics Concern   . Not on file     Social History Narrative     Family History   Problem Relation Age of Onset   . Cancer Mother      colon   . Hypertension Mother    . Hypertension Father    . Diabetes Father    . Heart failure Father            ASSESSMENT/PLAN     Christina Torres is a 50 y.o. female    Chief Complaint   Patient presents with   . Breast Mass     Patient c/o possible lump on left breast for 2 weeks. Patient "thought" she felt something in left breast. Admits pain that occured 9 months ago but unsure which breast. No family hx of breast cancer. Denies radiating pain.    [name] Hypertension     Discuss bp medication. Patient admits Norvasc 10 mg is causing her ankles to swell. Patient started medication 2 or 3 months ago; swelling has been present since starting medication.         1. Essential hypertension  - amLODIPine (NORVASC) 5 MG tablet; Take 1 tablet (5 mg total) by mouth daily.  Dispense: 30 tablet; Refill: 0  pts bp has been controlled and pt has been working on diet  Modifications and plans to continue this and also add exercise. I offered since she is unsure if any SE to other BP medication and likely hyponatremia due to HCTZ to trial a decrease in norvasc and see if this  helps with leg swelling but still controls her BP. Pt agreeable to this plan. She states she can not afford a BP cuff currently but can check BP at grocery store and will bring readings to next visit. F/u 1 month.    2. Breast mass, right  - Mammo Digital Diagnostic Bilateral W Cad; Future  No mass felt in area of pt concern. Discussed diagnostic mammogram if pt is concerned to be sure. Order given today.         Risk & Benefits of the new medication(s) if started at this visit, were explained to the patient (and family) who verbalized understanding & they are in agreement to the treatment plan.     Patient (family) encouraged to contact me/clinical staff  with any questions/concerns    MEDICATIONS     Current Outpatient Prescriptions   Medication Sig Dispense Refill   . amLODIPine (NORVASC) 10 MG tablet Take 1 tablet (10 mg total) by mouth daily. 90 tablet 0   . asenapine Maleate (SAPHRIS) 10 MG SL Tab Place 10 mg under the tongue nightly.        [name] atenolol (TENORMIN) 100 MG tablet Take 1 tablet (100 mg total) by mouth daily. 90 tablet 1   . IRON PO Take 1 tablet by mouth daily.     . OXcarbazepine (TRILEPTAL) 600 MG tablet Take 600 mg by mouth. In the morning       . OXcarbazepine (TRILEPTAL) 600 MG tablet Take 600 mg by mouth nightly.        . QUEtiapine (SEROQUEL) 400 MG tablet Take 800 mg by mouth nightly.       . traZODone (DESYREL) 100 MG tablet Take 100 mg by mouth nightly.        [name] amLODIPine (NORVASC) 5 MG tablet Take 1 tablet (5 mg total) by mouth daily. 30 tablet 0     No current facility-administered medications for this visit.       Allergies   Allergen Reactions   . Penicillins Angioedema   . Clindamycin Rash   . Lamictal [Lamotrigine] Rash       SUBJECTIVE     Chief Complaint   Patient presents with   . Breast Mass     Patient c/o possible lump on left breast for 2 weeks. Patient "thought" she felt something in left breast. Admits pain that occured 9 months ago but unsure which breast. No family hx of  breast cancer. Denies radiating pain.    [name] Hypertension     Discuss bp medication. Patient admits Norvasc 10 mg is causing her ankles to swell. Patient started medication 2 or 3 months ago; swelling has been present since starting medication.         HPI    Breast Mass    Patient reports possible lump on left breast for 2 weeks. Patient "thought" she felt something in left breast. Admits pain that occured 9 months ago but unsure which breast. No family hx of breast cancer. Denies radiating pain. Last mammogram normal in December 2016.   Hypertension    Discuss BP medication. Patient admits Norvasc 10 mg is causing her ankles to swell. Patient started medication 2 or 3 months ago; swelling has been present since starting medication. Pt did trial HCTZ but caused hyponatremia. Pt is unsure if she has tried any other BP medication or had reaction to them.        ROS     Review of Systems   Constitutional: Negative for fever, chills, diaphoresis, activity change, appetite change and fatigue.   Eyes: Negative for visual disturbance.   Respiratory: Negative for cough, chest tightness, shortness of breath and wheezing.    Cardiovascular: Positive for leg swelling (since starting norvasc). Negative for chest pain and palpitations.   Gastrointestinal: Negative for nausea and vomiting.   Genitourinary:        Possible lump in left breast  No nipple discharge   Musculoskeletal: Negative for back pain.   Skin: Negative for color change, pallor, rash and wound.   Neurological: Negative for dizziness, tremors, syncope, facial asymmetry, speech difficulty, weakness, light-headedness, numbness and headaches.   Psychiatric/Behavioral: The patient is not nervous/anxious.          The following portions  of the patient's history were reviewed and updated as appropriate: Allergies, Current Medications, Past Family History, Past Medical history, Past social history, Past surgical history, and Problem List.      PHYSICAL EXAM       Filed  Vitals:    03/01/16 1422   BP: 115/69   Pulse: 85   Temp: 98.8 F (37.1 C)   TempSrc: Oral   Resp: 16   Height: 1.626 m (5\' 4" )   Weight: 86.274 kg (190 lb 3.2 oz)   SpO2: 98%       Physical Exam   Nursing note and vitals reviewed.  Constitutional: She is oriented to person, place, and time. She appears well-developed and well-nourished. No distress.   HENT:   Head: Normocephalic and atraumatic.   Eyes: Conjunctivae are normal.   Cardiovascular: Normal rate, regular rhythm, S1 normal, S2 normal, normal heart sounds and normal pulses.  Exam reveals no gallop and no friction rub.    No murmur heard.  Pulmonary/Chest: Effort normal and breath sounds normal. No respiratory distress. She has no wheezes. She has no rales. She exhibits no tenderness. Right breast exhibits no inverted nipple, no mass, no skin change and no tenderness. Left breast exhibits no inverted nipple, no mass, no skin change and no tenderness. Breasts are symmetrical.   No breast mass felt in area of pt concern   Musculoskeletal: Normal range of motion. She exhibits edema (1+ non-pitting edema bilateral).   Neurological: She is alert and oriented to person, place, and time.   Skin: Skin is warm and dry. No rash noted. She is not diaphoretic.   Psychiatric: She has a normal mood and affect. Her behavior is normal. Judgment and thought content normal.           Results for orders placed or performed in visit on 01/07/16   Hemoglobin A1C   Result Value Ref Range    Hemoglobin A1C 5.5 4.6 - 5.9 %    Average Estimated Glucose 111.2 mg/dL   Lipid panel   Result Value Ref Range    Cholesterol 181 0 - 199 mg/dL    Triglycerides 89 34 - 149 mg/dL    HDL 62 40 - 0932 mg/dL    LDL Calculated 355 (H) 0 - 99 mg/dL    VLDL Cholesterol Cal 18 10 - 40 mg/dL    CHOL/HDL Ratio 2.9 See Below   Hemolysis index   Result Value Ref Range    Hemolysis Index 3 0 - [address]       Signed,  Balinda Quails, FNP-c  03/01/2016

## 2016-03-18 ENCOUNTER — Other Ambulatory Visit (INDEPENDENT_AMBULATORY_CARE_PROVIDER_SITE_OTHER): Payer: Self-pay | Admitting: Internal Medicine

## 2016-03-28 ENCOUNTER — Ambulatory Visit (INDEPENDENT_AMBULATORY_CARE_PROVIDER_SITE_OTHER): Payer: Medicare Other | Admitting: Family

## 2016-03-28 ENCOUNTER — Encounter (INDEPENDENT_AMBULATORY_CARE_PROVIDER_SITE_OTHER): Payer: Self-pay | Admitting: Family

## 2016-03-28 VITALS — BP 133/75 | HR 79 | Temp 98.5°F | Resp 16 | Wt 192.4 lb

## 2016-03-28 DIAGNOSIS — I10 Essential (primary) hypertension: Secondary | ICD-10-CM

## 2016-03-28 DIAGNOSIS — G4733 Obstructive sleep apnea (adult) (pediatric): Secondary | ICD-10-CM

## 2016-03-28 MED ORDER — LISINOPRIL 10 MG PO TABS
10.0000 mg | ORAL_TABLET | Freq: Every day | ORAL | Status: DC
Start: 2016-03-28 — End: 2016-04-19

## 2016-03-28 NOTE — Progress Notes (Signed)
[facility] [place]    PROGRESS NOTE      Patient: Christina Torres   Date: 03/28/2016   MRN: [id number]     Past Medical History   Diagnosis Date   . Hypertension    . Bipolar 1 disorder    . Fibroid tumor    . Aneurysm      2 aneurysm each side of nose. monitored closely   . Headache    . Eczema    . Anemia      PMH   . OSA (obstructive sleep apnea) 01/2016     Social History     Social History   . Marital Status: Single     Spouse Name: N/A   . Number of Children: N/A   . Years of Education: N/A     Occupational History   . Not on file.     Social History Main Topics   . Smoking status: Never Smoker    . Smokeless tobacco: Never Used   . Alcohol Use: No   . Drug Use: No   . Sexual Activity:     Partners: Male     Other Topics Concern   . Not on file     Social History Narrative     Family History   Problem Relation Age of Onset   . Cancer Mother      colon   . Hypertension Mother    . Hypertension Father    . Diabetes Father    . Heart failure Father            ASSESSMENT/PLAN     Christina Torres is a 50 y.o. female    Chief Complaint   Patient presents with   . Hypertension     Follow up Hypertension. Takes medication as prescribed. Has been checking bp once in evening for 6 days; has bp log. Discuss medication; decrease dose is making patient legs swell still.    [name] Herpes Zoster     Shingles exposure. 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 Patient is being treated for shingles. Patient concerned and would like to have shingles vaccine.         1. Essential hypertension  - lisinopril (PRINIVIL,ZESTRIL) 10 MG tablet; Take 1 tablet (10 mg total) by mouth daily.  Dispense: 30 tablet; Refill: 1    Current blood pressure is above goal. Ambulatory blood pressures correlate with office blood pressure measurements. Continue to optimize low salt diet and aerobic exercise efforts., Continue current medication regimen. and Recommend adding ACE-I therapy.  Side effects of ACE-I therapy discussed with  with patient including angioedema, hyperkalemia, headache, dizziness or NP cough. Recommend optimizing therapeutic lifestyle changes which include obtaining at least 150 minutes of aerobic exercise per week and eating a heart healthy diet ( i.e. DASH diet - www.heart.org or PopSteam.is ).  Recommend checking ambulatory blood pressures once or twice a day and document in a log.  Bring the log and blood pressure cuff into the office at the next follow-up visit or utilize MyChart to communicate BP readings every 2-4 weeks.    Will d/c noravsc due to SE. Can not take HCTZ due to hyponatremia hx with taking this medication. Has never taken lisinopril - will trial lisinopril and see how she does. F/u in 1 month with BP logs or sooner if any concerns.     2. Obstructive sleep apnea syndrome  Recently started on CPAP. This will  hopefully help BP as well.     Pt has not symptoms of shingles. We discussed recommendation to immunize at 60. We also discussed risk is higher if immunocompromised. Pt understands and will hold on vaccine.          Risk & Benefits of the new medication(s) if started at this visit, were explained to the patient (and family) who verbalized understanding & they are in agreement to the treatment plan.     Patient (family) encouraged to contact me/clinical staff with any questions/concerns    MEDICATIONS     Current Outpatient Prescriptions   Medication Sig Dispense Refill   . asenapine Maleate (SAPHRIS) 10 MG SL Tab Place 10 mg under the tongue nightly.     [name] atenolol (TENORMIN) 100 MG tablet Take 1 tablet (100 mg total) by mouth daily. 90 tablet 1   . IRON PO Take 1 tablet by mouth daily.     . OXcarbazepine (TRILEPTAL) 600 MG tablet Take 600 mg by mouth. In the morning       . OXcarbazepine (TRILEPTAL) 600 MG tablet Take 600 mg by mouth nightly.        . QUEtiapine (SEROQUEL) 400 MG tablet Take 800 mg by mouth nightly.       . traZODone (DESYREL) 100 MG tablet Take 100 mg by mouth nightly.         . cyclobenzaprine (FLEXERIL) 5 MG tablet TAKE 1-2 TABLET TWICE A DAY AS NEEDED FOR MUSCLE SPASMS  0   . lisinopril (PRINIVIL,ZESTRIL) 10 MG tablet Take 1 tablet (10 mg total) by mouth daily. 30 tablet 1     No current facility-administered medications for this visit.       Allergies   Allergen Reactions   . Penicillins Angioedema   . Clindamycin Rash   . Lamictal [Lamotrigine] Rash       SUBJECTIVE     Chief Complaint   Patient presents with   . Hypertension     Follow up Hypertension. Takes medication as prescribed. Has been checking bp once in evening for 6 days; has bp log. Discuss medication; decrease dose is making patient legs swell still.    [name] Herpes Zoster     Shingles exposure. 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 Patient is being treated for shingles. Patient concerned and would like to have shingles vaccine.         HPI    Hypertension    Follow up Hypertension. Takes medication as prescribed. Has been checking bp once in evening for 6 days; has bp log. Range 120-150/70-90. BP has increased since decreasing norvasc.  Discuss medication; pt has decreased dose and she is still having leg swelling. Pt reports this swelling started 3 months ago after she started Norvasc. She is unable to tolerated HCTZ as it caused hyponatremia. Pt has tolerated the atenolol well.    Herpes Zoster    Shingles exposure. 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 Patient is being treated for shingles. Patient concerned and would like to have shingles vaccine.      Pt also reports she had a sleep study and recently dx with sleep apnea and has started on CPAP and tolerating well.     ROS     Review of Systems   Constitutional: Negative for fever, chills, diaphoresis, activity change, appetite change and fatigue.   Eyes: Negative for visual disturbance.   Respiratory: Negative for cough, chest tightness,  shortness of breath and wheezing.    Cardiovascular: Positive for leg swelling  (started 3 months ago since starting norvasc). Negative for chest pain and palpitations.   Gastrointestinal: Negative for nausea and vomiting.   Musculoskeletal: Negative for back pain.   Skin: Negative for color change, pallor, rash and wound.   Neurological: Negative for dizziness, tremors, syncope, facial asymmetry, speech difficulty, weakness, light-headedness, numbness and headaches.   Psychiatric/Behavioral: The patient is not nervous/anxious.          The following portions of the patient's history were reviewed and updated as appropriate: Allergies, Current Medications, Past Family History, Past Medical history, Past social history, Past surgical history, and Problem List.      PHYSICAL EXAM       Filed Vitals:    03/28/16 1442   BP: 133/75   Pulse: 79   Temp: 98.5 F (36.9 C)   TempSrc: Oral   Resp: 16   Weight: 87.272 kg (192 lb 6.4 oz)   SpO2: 98%       Physical Exam   Nursing note and vitals reviewed.  Constitutional: She appears well-developed and well-nourished. No distress.   HENT:   Head: Normocephalic and atraumatic.   Eyes: Conjunctivae are normal.   Cardiovascular: Normal rate, regular rhythm, S1 normal, S2 normal, normal heart sounds and normal pulses.  Exam reveals no gallop and no friction rub.    No murmur heard.  Pulmonary/Chest: Effort normal and breath sounds normal. No respiratory distress. She has no wheezes. She has no rales. She exhibits no tenderness.   Musculoskeletal: Normal range of motion. She exhibits edema (1+ edema bilateral legs - nonpitting).   Skin: Skin is warm and dry. No rash noted. She is not diaphoretic.   Psychiatric: She has a normal mood and affect. Her behavior is normal. Judgment and thought content normal.         Results for orders placed or performed in visit on 01/07/16   Hemoglobin A1C   Result Value Ref Range    Hemoglobin A1C 5.5 4.6 - 5.9 %    Average Estimated Glucose 111.2 mg/dL   Lipid panel   Result Value Ref Range    Cholesterol 181 0 - 199 mg/dL     Triglycerides 89 34 - 149 mg/dL    HDL 62 40 - 5366 mg/dL    LDL Calculated 440 (H) 0 - 99 mg/dL    VLDL Cholesterol Cal 18 10 - 40 mg/dL    CHOL/HDL Ratio 2.9 See Below   Hemolysis index   Result Value Ref Range    Hemolysis Index 3 0 - [address]       Signed,  Balinda Quails, FNP-c  03/28/2016

## 2016-03-28 NOTE — Progress Notes (Signed)
Nursing Documentation:  Limb alert status: Patient asked and denied any limb restrictions for blood pressure/blood draws.  Has the patient seen any other providers since their last visit: [facility] for neck stiffness  The patient is due for nothing at this time, HM is up-to-date.

## 2016-03-28 NOTE — Patient Instructions (Signed)
Lisinopril Oral tablet  What is this medicine?  LISINOPRIL (lyse IN oh pril) is an ACE inhibitor. This medicine is used to treat high blood pressure and heart failure. It is also used to protect the heart immediately after a heart attack.  This medicine may be used for other purposes; ask your health care provider or pharmacist if you have questions.  What should I tell my health care provider before I take this medicine?  They need to know if you have any of these conditions:   diabetes   heart or blood vessel disease   immune system disease like lupus or scleroderma   kidney disease   low blood pressure   previous swelling of the tongue, face, or lips with difficulty breathing, difficulty swallowing, hoarseness, or tightening of the throat   an unusual or allergic reaction to lisinopril, other ACE inhibitors, insect venom, foods, dyes, or preservatives   pregnant or trying to get pregnant   breast-feeding  How should I use this medicine?  Take this medicine by mouth with a glass of water. Follow the directions on your prescription label. You may take this medicine with or without food. Take your medicine at regular intervals. Do not stop taking this medicine except on the advice of your doctor or health care professional.  Talk to your pediatrician regarding the use of this medicine in children. Special care may be needed. While this drug may be prescribed for children as young as 6 years of age for selected conditions, precautions do apply.  Overdosage: If you think you have taken too much of this medicine contact a poison control center or emergency room at once.  NOTE: This medicine is only for you. Do not share this medicine with others.  What if I miss a dose?  If you miss a dose, take it as soon as you can. If it is almost time for your next dose, take only that dose. Do not take double or extra doses.  What may interact with this medicine?   diuretics   lithium   NSAIDs, medicines for pain and  inflammation, like ibuprofen or naproxen   over-the-counter herbal supplements like hawthorn   potassium salts or potassium supplements   salt substitutes  This list may not describe all possible interactions. Give your health care provider a list of all the medicines, herbs, non-prescription drugs, or dietary supplements you use. Also tell them if you smoke, drink alcohol, or use illegal drugs. Some items may interact with your medicine.  What should I watch for while using this medicine?  Visit your doctor or health care professional for regular check ups. Check your blood pressure as directed. Ask your doctor what your blood pressure should be, and when you should contact him or her. Call your doctor or health care professional if you notice an irregular or fast heart beat.  Women should inform their doctor if they wish to become pregnant or think they might be pregnant. There is a potential for serious side effects to an unborn child. Talk to your health care professional or pharmacist for more information.  Check with your doctor or health care professional if you get an attack of severe diarrhea, nausea and vomiting, or if you sweat a lot. The loss of too much body fluid can make it dangerous for you to take this medicine.  You may get drowsy or dizzy. Do not drive, use machinery, or do anything that needs mental alertness until you know how   this drug affects you. Do not stand or sit up quickly, especially if you are an older patient. This reduces the risk of dizzy or fainting spells. Alcohol can make you more drowsy and dizzy. Avoid alcoholic drinks.  Avoid salt substitutes unless you are told otherwise by your doctor or health care professional.  Do not treat yourself for coughs, colds, or pain while you are taking this medicine without asking your doctor or health care professional for advice. Some ingredients may increase your blood pressure.  What side effects may I notice from receiving this  medicine?  Side effects that you should report to your doctor or health care professional as soon as possible:   abdominal pain with or without nausea or vomiting   allergic reactions like skin rash or hives, swelling of the hands, feet, face, lips, throat, or tongue   dark urine   difficulty breathing   dizzy, lightheaded or fainting spell   fever or sore throat   irregular heart beat, chest pain   pain or difficulty passing urine   redness, blistering, peeling or loosening of the skin, including inside the mouth   unusually weak   yellowing of the eyes or skin  Side effects that usually do not require medical attention (report to your doctor or health care professional if they continue or are bothersome):   change in taste   cough   decreased sexual function or desire   headache   sun sensitivity   tiredness  This list may not describe all possible side effects. Call your doctor for medical advice about side effects. You may report side effects to FDA at 1-800-FDA-1088.  Where should I keep my medicine?  Keep out of the reach of children.  Store at room temperature between 15 and 30 degrees C (59 and 86 degrees F). Protect from moisture. Keep container tightly closed. Throw away any unused medicine after the expiration date.  NOTE:This sheet is a summary. It may not cover all possible information. If you have questions about this medicine, talk to your doctor, pharmacist, or health care provider. Copyright 2015 [employer] Standard

## 2016-04-19 ENCOUNTER — Ambulatory Visit (INDEPENDENT_AMBULATORY_CARE_PROVIDER_SITE_OTHER): Payer: Medicare Other | Admitting: Family

## 2016-04-19 ENCOUNTER — Encounter (INDEPENDENT_AMBULATORY_CARE_PROVIDER_SITE_OTHER): Payer: Self-pay | Admitting: Family

## 2016-04-19 VITALS — BP 134/78 | HR 77 | Temp 98.4°F | Resp 16 | Wt 193.4 lb

## 2016-04-19 DIAGNOSIS — E871 Hypo-osmolality and hyponatremia: Secondary | ICD-10-CM

## 2016-04-19 DIAGNOSIS — R079 Chest pain, unspecified: Secondary | ICD-10-CM

## 2016-04-19 DIAGNOSIS — R609 Edema, unspecified: Secondary | ICD-10-CM

## 2016-04-19 DIAGNOSIS — I10 Essential (primary) hypertension: Secondary | ICD-10-CM

## 2016-04-19 LAB — COMPREHENSIVE METABOLIC PANEL
ALT: 16 U/L (ref 0–55)
AST (SGOT): 15 U/L (ref 5–34)
Albumin/Globulin Ratio: 0.9 (ref 0.9–2.2)
Albumin: 3.5 g/dL (ref 3.5–5.0)
Alkaline Phosphatase: 85 U/L (ref 37–106)
BUN: 13 mg/dL (ref 7.0–19.0)
Bilirubin, Total: 0.2 mg/dL (ref 0.1–1.2)
CO2: 28 mEq/L (ref 21–30)
Calcium: 9.4 mg/dL (ref 8.5–10.5)
Chloride: 102 mEq/L (ref 100–111)
Creatinine: 1 mg/dL (ref 0.4–1.5)
Globulin: 3.7 g/dL (ref 2.0–3.7)
Glucose: 98 mg/dL (ref 70–100)
Potassium: 3.8 mEq/L (ref 3.5–5.1)
Protein, Total: 7.2 g/dL (ref 6.0–8.3)
Sodium: 135 mEq/L (ref 135–146)

## 2016-04-19 LAB — GFR: EGFR: >60

## 2016-04-19 LAB — HEMOLYSIS INDEX: Hemolysis Index: 6 (ref 0–18)

## 2016-04-19 MED ORDER — LISINOPRIL 10 MG PO TABS
10.0000 mg | ORAL_TABLET | Freq: Every day | ORAL | Status: DC
Start: 2016-04-19 — End: 2016-06-27

## 2016-04-19 NOTE — Progress Notes (Signed)
[facility] [place]    PROGRESS NOTE      Patient: Christina Torres   Date: 04/19/2016   MRN: [id number]     Past Medical History   Diagnosis Date   . Hypertension    . Bipolar 1 disorder    . Fibroid tumor    . Aneurysm      2 aneurysm each side of nose. monitored closely   . Headache    . Eczema    . Anemia      PMH   . OSA (obstructive sleep apnea) 01/2016     Social History     Social History   . Marital Status: Single     Spouse Name: N/A   . Number of Children: N/A   . Years of Education: N/A     Occupational History   . Not on file.     Social History Main Topics   . Smoking status: Never Smoker    . Smokeless tobacco: Never Used   . Alcohol Use: No   . Drug Use: No   . Sexual Activity:     Partners: Male     Other Topics Concern   . Not on file     Social History Narrative     Family History   Problem Relation Age of Onset   . Cancer Mother      colon   . Hypertension Mother    . Hypertension Father    . Diabetes Father    . Heart failure Father            ASSESSMENT/PLAN     Christina Torres is a 50 y.o. female    Chief Complaint   Patient presents with   . Hypertension     Follow up Hypertension. Takes medication as prescribed. Admits swelling in feet and ankles; has had this before starting medication. Swelling has not worsened since starting medication. Notices swelling more when eating sweats and fried foods. Checks bp at home; not very often. Last bp a week ago 141/80.        1. Hyponatremia  - Comprehensive metabolic panel    Likely due to HCTZ but will recheck labs to be sure stable and also be sure kidney function normal    2. Peripheral edema  - Comprehensive metabolic panel  - ECG 12 lead  - Vascular and Vein Referral: Wonda Amis, MD [facility])      Unknown cause so will send to vascular for further evaluation. Also discussed trial of compression stockings and diet modifications. Lungs are clear.     3. Essential hypertension  - lisinopril (PRINIVIL,ZESTRIL) 10 MG tablet; Take 1 tablet (10 mg  total) by mouth daily.  Dispense: 30 tablet; Refill: 2  - ECG 12 lead    Ambulatory blood pressures correlate with office blood pressure measurements. Continue to optimize low salt diet and aerobic exercise efforts. and Continue current medication regimen. Recommend optimizing therapeutic lifestyle changes which include obtaining at least 150 minutes of aerobic exercise per week and eating a heart healthy diet ( i.e. DASH diet - www.heart.org or PopSteam.is ).  Recommend checking ambulatory blood pressures once or twice a day and document in a log.  Bring the log and blood pressure cuff into the office at the next follow-up visit or utilize MyChart to communicate BP readings every 2-4 weeks. Due to patient's symptoms and associated CV risk factors, recommend cardiology consult for further diagnostic intervention which may include CVST  and/or echocardiogram. Vascular surgery surveillance is overdue. and due to symptoms will sent to cardiologist and vascular. Continue to work on lifestyle changes as almost to goal prior to increasing medication.       4. Chest pain, unspecified type  - ECG 12 lead  Not present currently and only 1 episode in past month. Pt has followed with cardiologist for benign heart murmur in the past and will call and make an appt. Last EKg in November and normal and repeated today.          Risk & Benefits of the new medication(s) were explained to the patient (and family) who verbalized understanding & agreed to the treatment plan. Patient (family) encouraged to contact me/clinical staff with any questions/concerns      MEDICATIONS     Current Outpatient Prescriptions   Medication Sig Dispense Refill   . asenapine Maleate (SAPHRIS) 10 MG SL Tab Place 10 mg under the tongue nightly.     [name] atenolol (TENORMIN) 100 MG tablet Take 1 tablet (100 mg total) by mouth daily. 90 tablet 1   . benztropine (COGENTIN) 1 MG tablet Take 1 mg by mouth as needed.         . IRON PO Take 1 tablet by mouth  daily.     [name] lisinopril (PRINIVIL,ZESTRIL) 10 MG tablet Take 1 tablet (10 mg total) by mouth daily. 30 tablet 2   . OXcarbazepine (TRILEPTAL) 600 MG tablet Take 600 mg by mouth. In the morning       . OXcarbazepine (TRILEPTAL) 600 MG tablet Take 600 mg by mouth nightly.        . QUEtiapine (SEROQUEL) 400 MG tablet Take 800 mg by mouth nightly.       . traZODone (DESYREL) 100 MG tablet Take 100 mg by mouth nightly.        . cyclobenzaprine (FLEXERIL) 5 MG tablet TAKE 1-2 TABLET TWICE A DAY AS NEEDED FOR MUSCLE SPASMS  0     No current facility-administered medications for this visit.       Allergies   Allergen Reactions   . Hctz [Hydrochlorothiazide] Other (See Comments)     Hyponatremia     . Penicillins Angioedema   . Clindamycin Rash   . Lamictal [Lamotrigine] Rash       SUBJECTIVE     Chief Complaint   Patient presents with   . Hypertension     Follow up Hypertension. Takes medication as prescribed. Admits swelling in feet and ankles; has had this before starting medication. Swelling has not worsened since starting medication. Notices swelling more when eating sweats and fried foods. Checks bp at home; not very often. Last bp a week ago 141/80.        HPI    Hypertension: Patient here for follow-up of elevated blood pressure. She is exercising and is adherent to low salt diet.  Blood pressure is well controlled at home. 130-140/80.  Cardiac symptoms peripheral edema and chest pain after she left the office last visit and lasted about 1-2 hours and described as sharp pain 6-7/10 but then resolved without treatment and hasn't had this since. Patient denies dyspnea, fatigue, irregular heart beat, near-syncope, orthopnea, palpitations, paroxysmal nocturnal dyspnea, syncope and tachypnea.  Cardiovascular risk factors: hypertension and obesity (BMI >= 30 kg/m2). Use of agents associated with hypertension: father with hx of CHF in 70's.  none. History of target organ damage: none.    Pt reports she is still having  swelling in  bilateral lower extremities that is constant day and night and only goes away if she puts onions in the socks at bedtime. Pt does also report mild pain in legs but more like pressure due to swelling. We had originally thought swelling was r/t norvasc and had decreased dose to 5 mg but still present so we d/c at last visit. Pt is unsure if she had swelling prior to even starting norvasc. Pt has tolerated lisinopril well.    Pt is wearing CPAP.     ROS     General/Constitutional:   Denies Chills. Denies Fatigue. Denies Fever.   Ophthalmologic:   Denies Blurred vision.   ENT:   Denies Nasal Discharge. Denies Sinus pain. Denies Sore throat.   Respiratory:   Denies Cough. Denies Shortness of breath. Denies Wheezing.   Cardiovascular:   + 1 episodes of Chest pain. Denies Chest pain with exertion. Denies Palpitations. Denies  Swelling in hands. + swelling in bilateral feet.  Gastrointestinal:   Denies Abdominal pain. Denies Constipation. Denies Diarrhea. Denies Nausea. Denies  Vomiting.   Skin:   Denies Rash.   Neurologic:   Denies Dizziness. Denies Headache. Denies Tingling/Numbness.       The following portions of the patient's history were reviewed and updated as appropriate: Allergies, Current Medications, Past Family History, Past Medical history, Past social history, Past surgical history, and Problem List.      PHYSICAL EXAM       Filed Vitals:    04/19/16 1421 04/19/16 1430   BP: 157/81 134/78   Pulse: 80 77   Temp: 98.4 F (36.9 C)    TempSrc: Oral    Resp: 16    Weight: 87.726 kg (193 lb 6.4 oz)    SpO2: 98%        General Examination:   GENERAL APPEARANCE: alert, in no acute distress, well developed, well nourished,  oriented to time, place, and person.   ORAL CAVITY: normal oropharynx, normal lips, mucosa moist.   THROAT: normal appearance, clear.   NECK/THYROID: neck supple, no carotid bruit, carotid pulse 2+ bilaterally, no cervical  lymphadenopathy, no neck mass palpated, no jugular venous  distention, no  thyromegaly.   HEART: S1, S2 normal, no murmurs, rubs, gallops, regular rate and rhythm.   LUNGS: normal effort / no distress, normal breath sounds, clear to auscultation  bilaterally, no wheezes, rales, rhonchi.   EXTREMITIES: no clubbing, cyanosis. 1+ edema LLE and 2+ edema RLE both non-pitting.no calf pain, swelling, warmth or redness.  PERIPHERAL PULSES: 2+ dorsalis pedis, 2+ posterior tibial bilaterally.   NEUROLOGIC: alert and oriented.       Results for orders placed or performed in visit on 01/07/16   Hemoglobin A1C   Result Value Ref Range    Hemoglobin A1C 5.5 4.6 - 5.9 %    Average Estimated Glucose 111.2 mg/dL   Lipid panel   Result Value Ref Range    Cholesterol 181 0 - 199 mg/dL    Triglycerides 89 34 - 149 mg/dL    HDL 62 40 - 8469 mg/dL    LDL Calculated 629 (H) 0 - 99 mg/dL    VLDL Cholesterol Cal 18 10 - 40 mg/dL    CHOL/HDL Ratio 2.9 See Below   Hemolysis index   Result Value Ref Range    Hemolysis Index 3 0 - [address]       Signed,  Balinda Quails, FNP-c  04/19/2016

## 2016-04-19 NOTE — Progress Notes (Signed)
Nursing Documentation:  Limb alert status: Patient asked and denied any limb restrictions for blood pressure/blood draws.  Has the patient seen any other providers since their last visit: Hillary Bow, MD-Psychiatrist   The patient is due for nothing at this time, HM is up-to-date.

## 2016-04-19 NOTE — Patient Instructions (Signed)
Leg Swelling in Both Legs    Swelling of the feet, ankles, and legs is called edema. It is caused by excess fluid that has collected in the tissues. Extra fluid in the body settles in the lowest part because of gravity. This is why the legs and feet are most affected.  Some of the causes for edema include:   Disease of the heart like congestive heart failure   Standing or sitting for long periods of time   Infection of the feet or legs   Blood pooling in the veins of your legs (venous insufficiency)   Dilated veins in your lower leg (varicose veins)   Garters or other clothing that is tight on your legs. This will cause blood to pool in your legs because the clothing limits blood flow.   Some medicines such as hormones like birth control pills, some blood pressure medicines like calcium channel blockers (amlodipine) and steroids, some antidepressants like MAO inhibitors and tricyclics   Menstrual periods that cause you to retain fluids   Many types of renal disease   Liver failureor cirrhosis   Pregnancy, some swelling is normal, but a sudden increase in leg swelling or weight gain can be a sign of a dangerous complication of pregnancy   Poor nutrition   Thyroid disease  Medical treatment will depend on what is causing the swelling in your legs. Your healthcare provider may prescribe water pills (diuretics) to get rid of the extra fluid.  Home care  Follow these guidelines when caring for yourself at home:   Don't wear clothing like garters that is tight on your legs.   Keep your legs up while lying or sitting.   If infection, injury, or recent surgery is causing the swelling, stay off your legs as much as possible until symptoms get better.   If your healthcare provider says that your leg swelling is caused by venous insufficiency or varicose veins, don't sit or stand in one place for long periods of time. Take breaks and walk about every few hours. Brisk walking is a good exercise. It helps  circulate the blood that has collected in your leg. Talk with your provider about using support stockings to stop daytime leg swelling.   If your provider says that heart disease is causing your leg swelling, follow a low-salt diet to stop extra fluid from staying in your body. You may also need medicine.  Follow-up care  Follow up with your healthcare provider, or as advised.  When to seek medical advice  Call your healthcare provider right away if any of these occur:   New shortness of breath or chest pain   Shortness of breath or chest pain that gets worse   Swelling in both legs or ankles that gets worse   Swelling of the abdomen   Redness, warmth, or swelling in one leg   Fever of 100.25F (38C) or higher, or as directed by your healthcare provider   Yellow color to your skin or eyes   Rapid, unexplained weight gain   Having to sleep upright or use an increased number of pillows  Date Last Reviewed: 11/20/2014   2000-2016 [employer] [address] [place], [place] [address]. All rights reserved. This information is not intended as a substitute for professional medical care. Always follow your healthcare professional's instructions.        Putting on Compression Stockings     Turn the stocking inside-out, then fit it over your toes  and heel.         Roll the stocking up your leg.           Once stockings are on, make sure the top of the stocking is about two fingers' width below the crease of the knee (or the groin if you wear thigh-high stockings).         Use equipment, such as a stocking donner, or wear rubber gloves to make it easier to put on compression stockings.        Elastic compression stockings are prescribed to treat many vein problems. Wearing them may be the most important thing you do to manage your symptoms. The stockings fit tightly aroundyour ankle, gradually reducing in pressure as they go up your legs. This helps keep blood flowing toyour heart. As a result,  swelling is reduced. Your healthcare provider will prescribe stockings at a safe pressure for you. He or she will also tell you how often to wear and remove the stockings. Follow these instructions closely.Also, do not buy or wear compression stockings without first seeing your healthcare provider.  Tips for wear and care  To wear stockings safely and to get the most benefit:   Wear the length prescribed by your healthcare provider.   Pull them to the designated height and no farther. Don't let them bunch at the top. This can restrict blood flow and increase swelling.   Wear the stockingsfor the amount of time your healthcare provider recommends. Replace them when they start to feel loose. This will likely be every 3 to 6 months.   Remove them as your healthcare provider directs. When removed, wash your legs. Then check your legs and feet for sores. Call your healthcare provider if you find a sore. Don't put the stockings back on unless your healthcare provider directs.   Wash the stockings as instructed. They may need to be hand-washed.  Date Last Reviewed: 12/21/2014   2000-2016 [employer] [address], [place], [place] [address]. All rights reserved. This information is not intended as a substitute for professional medical care. Always follow your healthcare professional's instructions.

## 2016-04-20 ENCOUNTER — Encounter (INDEPENDENT_AMBULATORY_CARE_PROVIDER_SITE_OTHER): Payer: Self-pay | Admitting: Family

## 2016-04-27 ENCOUNTER — Ambulatory Visit (INDEPENDENT_AMBULATORY_CARE_PROVIDER_SITE_OTHER): Payer: Medicare Other | Admitting: Family

## 2016-06-19 ENCOUNTER — Other Ambulatory Visit (INDEPENDENT_AMBULATORY_CARE_PROVIDER_SITE_OTHER): Payer: Self-pay | Admitting: Family Medicine

## 2016-06-20 NOTE — Telephone Encounter (Signed)
Pt hasn't been on this medication for some time according to our records. Is she taking it? Since when?

## 2016-06-27 ENCOUNTER — Ambulatory Visit (INDEPENDENT_AMBULATORY_CARE_PROVIDER_SITE_OTHER): Payer: Medicare Other | Admitting: Internal Medicine

## 2016-06-27 ENCOUNTER — Encounter (INDEPENDENT_AMBULATORY_CARE_PROVIDER_SITE_OTHER): Payer: Self-pay | Admitting: Internal Medicine

## 2016-06-27 VITALS — BP 150/87 | HR 67 | Temp 98.5°F | Resp 16 | Wt 194.6 lb

## 2016-06-27 DIAGNOSIS — Z23 Encounter for immunization: Secondary | ICD-10-CM

## 2016-06-27 DIAGNOSIS — G4733 Obstructive sleep apnea (adult) (pediatric): Secondary | ICD-10-CM

## 2016-06-27 DIAGNOSIS — I10 Essential (primary) hypertension: Secondary | ICD-10-CM

## 2016-06-27 MED ORDER — LISINOPRIL 20 MG PO TABS
20.0000 mg | ORAL_TABLET | Freq: Every day | ORAL | 0 refills | Status: DC
Start: 2016-06-27 — End: 2016-08-26

## 2016-06-27 NOTE — Patient Instructions (Signed)
Controlling High Blood Pressure  High blood pressure (hypertension) is often called the silent killer. This is because many people who have it don't know it. High blood pressure is defined as 140/90 mm Hg or higher.Know your blood pressure and remember to check it regularly. Doing so can save your life. Here are some things you can do to help control your blood pressure.    Choose heart-healthy foods   Select low-salt, low-fat foods. Limit sodium intake to 2,400 mg per day or the amount suggested by your healthcare provider.   Limit canned, dried, cured, packaged, and fast foods. These can contain a lot of salt.   Eat 8 to 10 servings of fruits and vegetables every day.   Choose lean meats, fish, or chicken.   Eat whole-grain pasta, brown rice, and beans.   Eat 2 to 3 servings of low-fat or fat-free dairy products.   Ask your doctor about the DASH eating plan. This plan helps reduce blood pressure.   When you go to a restaurant, ask that your meal be prepared with no added salt.  Maintain a healthy weight   Ask your healthcare provider how many calories to eat a day. Then stick to that number.   Ask your healthcare provider what weight range is healthiest for you. If you are overweight, a weight loss of only 3% to 5% of your body weightcan help lower blood pressure. Generally, a good weight loss goal is to lose 10% of your body weight in a year.   Limit snacks and sweets.   Get regular exercise.  Get up and get active   Choose activities you enjoy. Find ones you can do with friends or family. This includes bicycling, dancing, walking, and jogging.   Park farther away from building entrances.   Use stairs instead of the elevator.   When you can, walk or bike instead of driving.   Rake leaves, garden, or do household repairs.   Be active at a moderate to vigorous level of physical activity for at least 40 minutes for a minimum of 3 to 4 days a week.  Manage stress   Make time to relax and enjoy  life. Find time to laugh.   Communicate your concerns with your loved ones and your healthcare provider.   Visit with family and friends, and keep up with hobbies.  Limit alcohol and quit smoking   Men should have no more than 2 drinks per day.   Women should have no more than 1 drink per day.   Talk with your healthcare provider about quitting smoking. Smoking significantly increases your risk for heart disease and stroke. Ask your healthcare provider about community smoking cessation programs and other options.  Medicines  If lifestyle changes aren't enough, your healthcare provider may prescribe high blood pressure medicine. Take all medicines as prescribed. If you have any questions about your medicines, ask your healthcare provider before stopping or changing them.   Date Last Reviewed: 12/17/2014   2000-2016 [employer] [address], [place] [address]. All rights reserved. This information is not intended as a substitute for professional medical care. Always follow your healthcare professional's instructions.

## 2016-06-27 NOTE — Progress Notes (Signed)
Have you seen any specialists/other providers since your last visit with [place]?    Yes - Psychiatrist      Limb alert protocol reviewed?      Yes    No changes to family, medical or surgical history per pt.    Patient presented to the office for Flu Vacc administration.    Vaccine was given to Right deltoid.    No reaction was noted and patient left in good condition.

## 2016-06-27 NOTE — Progress Notes (Signed)
Subjective:      Patient ID: Christina Torres is a 50 y.o. female  Chief Complaint   Patient presents with   . Hypertension     f/u   . Flu Vaccine        HPI    Problem   Obstructive Sleep Apnea Syndrome    Uses CPAP nightly.      Essential Hypertension    Diagnosed around 2013 by previous PCP. At the time said it was 'mild'. Does have family hx of HTN.   Previous PCP started pt on and continues HCTZ qd (was 12.5, increased to 25mg  04/2015).  Had hyponatremia and was taken off HCTZ. Tries to watch salt, not exercising, not following formal diet.   Since last visit, on lisinopril 10mg  daily and atenolol 100mg  daily. Has been 150-170s.   Exercising [facility]. Tries to avoid salty foods, did have BBQ wings today.     BP Readings from Last 3 Encounters:   06/27/16 167/90   04/19/16 134/78   03/28/16 133/75               The following portions of the patient's history were reviewed and updated as appropriate: current medications, allergies, past family history, past medical history, past social history, past surgical history and problem list.     Review of Systems   Constitutional: Negative.    Respiratory: Negative.    Cardiovascular: Positive for chest pain (sometimes after eating fried chicken).        BP 167/90   Pulse 62   Temp 98.5 F (36.9 C) (Oral)   Resp 16   Wt 88.3 kg (194 lb 9.6 oz)   LMP 06/20/2016   SpO2 98%   BMI 33.40 kg/m    Objective:   Physical Exam   Constitutional: She is oriented to person, place, and time. She appears well-developed and well-nourished. No distress.   Eyes: Conjunctivae are normal.   Cardiovascular: Normal rate, regular rhythm and normal heart sounds.    Pulmonary/Chest: Effort normal and breath sounds normal.   Neurological: She is alert and oriented to person, place, and time.   Psychiatric: She has a normal mood and affect.   Nursing note and vitals reviewed.      Assessment:     1. Essential hypertension  lisinopril (PRINIVIL,ZESTRIL) 20 MG tablet   2. Obstructive  sleep apnea syndrome     3. Need for influenza vaccination  Flu vaccine QUAD PRES FREE 77YRS & GREATER         Plan:     50 y.o. female presents for follow up.     1. HTN: not well controlled   -increase lisinopril to 20mg  daily  -continue atenolol 100mg  daily  -encouraged to continue to limit salt in diet  -monitor home bp  -counseled on signs/symptoms to warrant re-evaluation   -care plan letter next due 06/2017    2. OSA: compliant with CPAP  -continue CPAP use    3. GERD: previous treatment with PPI improved chest pain  -counseled at length on diet, lifestyle changes  -resume PPI if needed    Flu shot today    Return in about 4 weeks (around 07/25/2016) for follow up bp.     Gust Brooms, MD

## 2016-07-18 ENCOUNTER — Other Ambulatory Visit (INDEPENDENT_AMBULATORY_CARE_PROVIDER_SITE_OTHER): Payer: Self-pay | Admitting: Family Medicine

## 2016-07-19 NOTE — Telephone Encounter (Signed)
Please call and clarify with pt. She was no longer taking this at our last visit and we were adjusting the lisinopril. Please call and clarify her medication regimen.

## 2016-07-20 ENCOUNTER — Other Ambulatory Visit (INDEPENDENT_AMBULATORY_CARE_PROVIDER_SITE_OTHER): Payer: Self-pay | Admitting: Family

## 2016-07-20 DIAGNOSIS — I10 Essential (primary) hypertension: Secondary | ICD-10-CM

## 2016-07-21 NOTE — Telephone Encounter (Signed)
Attempted to contact pt by phone call to clarify medications that she is taking.  No answer, left VM for pt to call back.  Waiting for a call back.

## 2016-07-25 ENCOUNTER — Telehealth (INDEPENDENT_AMBULATORY_CARE_PROVIDER_SITE_OTHER): Payer: Self-pay

## 2016-07-25 NOTE — Telephone Encounter (Signed)
Post Acute Discharge Call    Hospital / ED Discharge Date: 07/23/2016  Primary Discharge Dx: Chest pain, unspecified type   Secondary Dx:   Hospital / ED Discharge Appt with PCP: [facility]    RN 2 [facility]) placed call to patient to follow up on recent ER discharge, assess current status, address questions/concerns regarding discharge instructions / medications and encourage patient to schedule Hospital Discharge follow up appt with PCP; No Answer; LVMM with contact information and request for return call. Will continue to follow.    Per ER discharge instructions patient is to Call Monday morning to schedule a follow-up appointment with your cardiologist (Dr. Quentin Mulling) as well as your primary care provider.

## 2016-07-29 ENCOUNTER — Other Ambulatory Visit (INDEPENDENT_AMBULATORY_CARE_PROVIDER_SITE_OTHER): Payer: Self-pay | Admitting: Internal Medicine

## 2016-07-29 DIAGNOSIS — I10 Essential (primary) hypertension: Secondary | ICD-10-CM

## 2016-07-29 NOTE — Telephone Encounter (Signed)
Refill sent.

## 2016-08-11 ENCOUNTER — Telehealth (INDEPENDENT_AMBULATORY_CARE_PROVIDER_SITE_OTHER): Payer: Self-pay

## 2016-08-11 DIAGNOSIS — R350 Frequency of micturition: Secondary | ICD-10-CM

## 2016-08-11 NOTE — Telephone Encounter (Signed)
Pt states she was treated at ED for UTI. Pt prescribed Macrobid, and is due to complete treatment 12/22. Pt is still experiencing urinary frequency and is requesting another Macrobid be sent to the pharmacy      Verbally consulted with PCP:     Advising schedule pt for Nurse Visit, run in house UA and send out for culture.    Spoke with pt, she verbalized understanding and will schedule NV for tomorrow, please place orders.

## 2016-08-11 NOTE — Telephone Encounter (Signed)
Orders placed. Pt should follow up if symptoms persist. She should go to UC or wait for appt in PCP office and not go to the ER for such complaints in the future.

## 2016-08-12 ENCOUNTER — Other Ambulatory Visit (INDEPENDENT_AMBULATORY_CARE_PROVIDER_SITE_OTHER): Payer: Medicare Other

## 2016-08-12 ENCOUNTER — Ambulatory Visit (FREE_STANDING_LABORATORY_FACILITY): Payer: Medicare Other

## 2016-08-12 DIAGNOSIS — R350 Frequency of micturition: Secondary | ICD-10-CM

## 2016-08-12 LAB — POCT URINALYSIS DIPSTIX (10)(MULTI-TEST)
Bilirubin, UA POCT: NEGATIVE
Glucose, UA POCT: 250 mg/dL — AB
Ketones, UA POCT: NEGATIVE mg/dL
Nitrite, UA POCT: NEGATIVE
POCT Leukocytes, UA: NEGATIVE
POCT Spec Gravity, UA: 1.03 (ref 1.001–1.035)
POCT pH, UA: 6 (ref 5–8)
Protein, UA POCT: NEGATIVE mg/dL
Urobilinogen, UA: 0.2 mg/dL

## 2016-08-12 NOTE — Addendum Note (Signed)
Addended by: Letitia Libra on: 08/12/2016 11:50 AM     Modules accepted: Orders

## 2016-08-12 NOTE — Progress Notes (Signed)
Per MD orders, POCT U/A tested and urine culture sent out. Patient left office in good condition.

## 2016-08-26 ENCOUNTER — Encounter (INDEPENDENT_AMBULATORY_CARE_PROVIDER_SITE_OTHER): Payer: Self-pay | Admitting: Internal Medicine

## 2016-08-26 DIAGNOSIS — I10 Essential (primary) hypertension: Secondary | ICD-10-CM

## 2016-08-29 MED ORDER — LISINOPRIL 20 MG PO TABS
20.0000 mg | ORAL_TABLET | Freq: Every day | ORAL | 0 refills | Status: DC
Start: 2016-08-29 — End: 2016-11-25

## 2016-08-31 ENCOUNTER — Encounter (INDEPENDENT_AMBULATORY_CARE_PROVIDER_SITE_OTHER): Payer: Self-pay

## 2016-08-31 ENCOUNTER — Telehealth (INDEPENDENT_AMBULATORY_CARE_PROVIDER_SITE_OTHER): Payer: Self-pay | Admitting: Internal Medicine

## 2016-08-31 NOTE — Telephone Encounter (Signed)
lisinopril (PRINIVIL,ZESTRIL) 20 MG tablet 90 tablet 0 08/29/2016     Sig - Route: Take 1 tablet (20 mg total) by mouth daily. - Oral        Patient states she should be taking bid, please send new Rx to [facility]- [address]

## 2016-08-31 NOTE — Telephone Encounter (Signed)
Pt called back and stated Rx Lisinopril pt recd in 06/2016 was 20mg  tablets and pt had been taking 2 tablets per day = 40mg /Day. Pt's insurance will not cover medication due to 90 day supply sent 06/2016.    Advised pt to take 1 pill/day. Pt verbalized understanding.    Called pharmacy and spoke to pharmacist. Advised pharm that pt NEEDS medication. Pharm to contact insurance to see if medication can be filled early.

## 2016-08-31 NOTE — Telephone Encounter (Signed)
Pt returned call. Pt previously had Rx Lisinopril 10mg , back in 06/2016, PCP instructed pt to increase to 20mg , therefore, pt was taking 2-10mg  tablets/day. Advised pt going forward, to take only 1 tablet daily of Lisinopril 20mg . Also, reviewed medication list with pt. All medications are correct in pt's record. Pt has f/u with PCP on 09/08/16. Pt verbalized understanding.

## 2016-08-31 NOTE — Telephone Encounter (Signed)
Lisinopril is a once a day medication. Please call and review med list with pt.

## 2016-08-31 NOTE — Telephone Encounter (Signed)
Rx Lisinopril was sent to pharm on 08/29/16 for 90 tablets.

## 2016-08-31 NOTE — Telephone Encounter (Signed)
Per Dr. Otilio Jefferson, pt should be taking LISINOPRIL ONCE daily, not BID.    Called pt and left detailed message advising of taking lisinopril QD, not BID. Requested pt to CB to review all medications, OTC and prescribed to ensure medication list is correct and UTD. Awaiting CB.

## 2016-09-01 ENCOUNTER — Telehealth (INDEPENDENT_AMBULATORY_CARE_PROVIDER_SITE_OTHER): Payer: Self-pay | Admitting: Family Medicine

## 2016-09-01 NOTE — Telephone Encounter (Signed)
On Call Note:  Patient called  She had been out of her lisinopril for a week as she ha been taking twice as much as had been prescribed, 40 mg daily rather than 20 mg.   She is wondering if she should double up on her dosing to catch up now that she has gotten her refill.   Her pressure has been running about 150 at home off the medication, and says it had been running the same on it.   I recommended that she resume the 20 mg nightly and monitor her pressure daily   Follow up as planned with Dr. Otilio Jefferson and bring in her blood pressure readings.

## 2016-09-02 NOTE — Telephone Encounter (Signed)
Agree with recommendations. Will discuss medication adjustment at her appt next week.

## 2016-09-08 ENCOUNTER — Ambulatory Visit (INDEPENDENT_AMBULATORY_CARE_PROVIDER_SITE_OTHER): Payer: Medicare Other | Admitting: Internal Medicine

## 2016-09-08 ENCOUNTER — Encounter (INDEPENDENT_AMBULATORY_CARE_PROVIDER_SITE_OTHER): Payer: Self-pay | Admitting: Internal Medicine

## 2016-09-08 VITALS — BP 110/66 | HR 76 | Temp 98.5°F | Resp 20 | Wt 196.4 lb

## 2016-09-08 DIAGNOSIS — R81 Glycosuria: Secondary | ICD-10-CM

## 2016-09-08 DIAGNOSIS — I10 Essential (primary) hypertension: Secondary | ICD-10-CM

## 2016-09-08 LAB — POCT URINALYSIS DIPSTIX (10)(MULTI-TEST)
Blood, UA POCT: NEGATIVE
Glucose, UA POCT: NEGATIVE mg/dL
Nitrite, UA POCT: NEGATIVE
POCT Spec Gravity, UA: 1.02 (ref 1.001–1.035)
POCT pH, UA: 6 (ref 5–8)
Urobilinogen, UA: 0.2 mg/dL

## 2016-09-08 NOTE — Progress Notes (Signed)
Have you seen any specialists/other providers since your last visit with us?    No      Limb alert protocol reviewed?      Yes      No changes to family, medical or surgical history per pt.    Mammo due. Pt notified.

## 2016-09-08 NOTE — Progress Notes (Signed)
Subjective:      Patient ID: Christina Torres is a 51 y.o. female  Chief Complaint   Patient presents with   . glucose in urine     last POCT UA showed glucose in urine        HPI     Here for follow up. Last urine test showed some glucose so she is worried about diabetes. No urinary symptoms. No urinary frequency, urgency, dysuria at this time. No hematuria.     Problem   Essential Hypertension    Diagnosed around 2013 by previous PCP. At the time said it was 'mild'. Does have family hx of HTN.   Previous PCP started pt on and continues HCTZ qd (was 12.5, increased to 25mg  04/2015).  Had hyponatremia and was taken off HCTZ. Tries to watch salt, not exercising, not following formal diet.   Since last visit, on lisinopril 10mg  daily and atenolol 100mg  daily. Has been 150-170s.   Exercising at [facility]. Tries to avoid salty foods, did have BBQ wings today.     Since last visit: started doing Tai Chi. Taking lisinopril 20mg  daily and atenolol 100mg  daily. Home readings 130-150s. Thinks something could be wrong with the machine since the reading here was much better.     BP Readings from Last 3 Encounters:   09/08/16 110/66   06/27/16 150/87   04/19/16 134/78               The following portions of the patient's history were reviewed and updated as appropriate: current medications, allergies, past family history, past medical history, past social history, past surgical history and problem list.     Review of Systems   Constitutional: Negative.    Respiratory: Negative.    Cardiovascular: Negative.          BP 110/66   Pulse 76   Temp 98.5 F (36.9 C) (Oral)   Resp 20   Wt 89.1 kg (196 lb 6.4 oz)   LMP 08/29/2016   BMI 33.71 kg/m    Objective:   Physical Exam   Constitutional: She is oriented to person, place, and time. She appears well-developed and well-nourished. No distress.   Eyes: Conjunctivae are normal.   Cardiovascular: Normal rate, regular rhythm and normal heart sounds.    Pulmonary/Chest: Effort  normal and breath sounds normal.   Neurological: She is alert and oriented to person, place, and time.   Psychiatric: She has a normal mood and affect.   Nursing note and vitals reviewed.    UA: neg for glucose. Trace protein, ketones, 1+ LE  Assessment:     1. Essential hypertension     2. Glucosuria  POCT UA Dipstix (10)(Multi-Test)         Plan:     Subjective:      Patient ID: Christina Torres is a 51 y.o. female  Chief Complaint   Patient presents with   . glucose in urine     last POCT UA showed glucose in urine        HPI    Problem   Essential Hypertension    Diagnosed around 2013 by previous PCP. At the time said it was 'mild'. Does have family hx of HTN.   Previous PCP started pt on and continues HCTZ qd (was 12.5, increased to 25mg  04/2015).  Had hyponatremia and was taken off HCTZ. Tries to watch salt, not exercising, not following formal diet.   Since last visit, on lisinopril  10mg  daily and atenolol 100mg  daily. Has been 150-170s.   Exercising at [facility]. Tries to avoid salty foods, did have BBQ wings today.     Since last visit: started doing Tai Chi. Taking lisinopril 20mg  daily and atenolol 100mg  daily. Home readings 130-150s. Thinks something could be wrong with the machine since the reading here was much better.     BP Readings from Last 3 Encounters:   09/08/16 110/66   06/27/16 150/87   04/19/16 134/78               The following portions of the patient's history were reviewed and updated as appropriate: current medications, allergies, past family history, past medical history, past social history, past surgical history and problem list.     Review of Systems   Constitutional: Negative.    Respiratory: Negative.    Cardiovascular: Positive for chest pain (sometimes after eating fried chicken).        BP 110/66   Pulse 76   Temp 98.5 F (36.9 C) (Oral)   Resp 20   Wt 89.1 kg (196 lb 6.4 oz)   LMP 08/29/2016   BMI 33.71 kg/m    Objective:   Physical Exam   Constitutional: She is oriented  to person, place, and time. She appears well-developed and well-nourished. No distress.   Eyes: Conjunctivae are normal.   Cardiovascular: Normal rate, regular rhythm and normal heart sounds.    Pulmonary/Chest: Effort normal and breath sounds normal.   Neurological: She is alert and oriented to person, place, and time.   Psychiatric: She has a normal mood and affect.   Nursing note and vitals reviewed.      Assessment:     1. Essential hypertension     2. Glucosuria  POCT UA Dipstix (10)(Multi-Test)         Plan:     51 y.o. female presents for follow up.     1. HTN: well controlled   -continue lisinopril 20mg  daily  -continue atenolol 100mg  daily  -encouraged to continue to limit salt in diet  -monitor home bp and bring machine to next visit  -counseled on signs/symptoms to warrant re-evaluation   -care plan letter next due 06/2017    2. OSA: compliant with CPAP  -continue CPAP use    3. GERD: previous treatment with PPI improved chest pain  -counseled at length on diet, lifestyle changes  -resume PPI if needed    4. Glucosuria: resolved  -continue to monitor fasting glucose with routine labs.   -UA with slight abnormalities, pt without urinary complaints and had negative urine culture a few weeks ago. No indication to send for further testing.     Return in about 3 months (around 12/07/2016) for follow up bp.     Gust Brooms, MD

## 2016-09-09 ENCOUNTER — Other Ambulatory Visit (INDEPENDENT_AMBULATORY_CARE_PROVIDER_SITE_OTHER): Payer: Self-pay

## 2016-09-09 NOTE — Progress Notes (Signed)
Received a referral fm Dr Otilio Jefferson today.  1st outreach call to cell number for care coordination. Left vm message w/ nurse contact info encouraging pt to return nurse call.  Will continue to outreach to pt.  Basilia Jumbo, RN, BSN  Nurse [employer]   T [phone]   Judie Petit [phone]

## 2016-09-13 ENCOUNTER — Other Ambulatory Visit (INDEPENDENT_AMBULATORY_CARE_PROVIDER_SITE_OTHER): Payer: Self-pay

## 2016-09-13 NOTE — Progress Notes (Signed)
Pt called and left vm that she is available fm 1-5 PM.    Returned pt's call. Left vm message w/ nurse contact info.    Basilia Jumbo, RN, BSN  Nurse [employer]   T [phone]   Judie Petit [phone]

## 2016-09-20 ENCOUNTER — Other Ambulatory Visit (INDEPENDENT_AMBULATORY_CARE_PROVIDER_SITE_OTHER): Payer: Self-pay

## 2016-09-20 NOTE — Progress Notes (Signed)
3rd outreach call to cell/home number for care coordination. Left vm message w/ nurse contact info encouraging pt to return nurse call.  Will continue to outreach to pt.

## 2016-11-03 ENCOUNTER — Other Ambulatory Visit (INDEPENDENT_AMBULATORY_CARE_PROVIDER_SITE_OTHER): Payer: Self-pay

## 2016-11-03 NOTE — Progress Notes (Signed)
4th outreach call to cell number for care coordination. Left vm message w/ nurse contact info encouraging pt to return nurse call.  Will continue to outreach to pt.  Basilia Jumbo, RN, BSN  Nurse [employer]   T [phone]   Judie Petit [phone]

## 2016-11-03 NOTE — Progress Notes (Signed)
11/03/16 1657   General Assessment   Assessment completed with patient   Cognitive Issues No   Medication  No   Housing concerns No   Health Literacy Assessed Yes   Hearing Impaired No   Diet - Unable to Tolerate Does Not Follow   Diet - Type DASH Diet   Exercise Yes   Exercise > type of exercise Tai Chi 10 min 5days/wk

## 2016-11-03 NOTE — Progress Notes (Signed)
Care Plan      Name: Christina Torres     MRN: [id number]       DOB:   14-Mar-1966     PCP: Mariann Laster, MD      Advance Directives:  N     Communication Preferences: Phone    Diagnosis:  Patient Active Problem List   Diagnosis   . Essential hypertension   . Bipolar 1 disorder   . Iron deficiency anemia due to chronic blood loss   . Fibroid uterus   . Cerebral aneurysm   . Obstructive sleep apnea syndrome       Patient Care Team:  Mariann Laster, MD as PCP - General (Pediatrics)  Dondra Prader, MD as Consulting Physician (Obstetrics and Gynecology)  Stephanie Coup, MD as Consulting Physician (Neurology)  Shon Millet, MD as Consulting Physician (Neuroradiology)  Dondra Prader, MD as Consulting Physician (Obstetrics and Gynecology)  Deanne Coffer, MD as Consulting Physician (Sleep Medicine)  Hillary Bow, MD as Consulting Physician (Psychiatry)    Health Maintenance   Topic Date Due   . MAMMOGRAM  08/12/2016   . PAP SMEAR  10/06/2016   . DEPRESSION SCREENING  06/27/2017   . [facility] CARE PLAN LETTER  06/27/2017   . COLONOSCOPY TEN YEARS  02/15/2025   . INFLUENZA VACCINE  Completed         Immunization History   Administered Date(s) Administered   . Influenza quadrivalent (IM) 3 Yrs & greater 06/02/2014   . Influenza quadrivalent (IM) PF 3 Yrs & greater 05/07/2015, 06/27/2016   . PPD Test 07/06/2011, 09/08/2015            Allergies   Allergen Reactions   . Hctz [Hydrochlorothiazide] Other (See Comments)     Hyponatremia     . Penicillins Angioedema   . Clindamycin Rash   . Lamictal [Lamotrigine] Rash          Current Outpatient Prescriptions   Medication Sig Dispense Refill   . asenapine Maleate (SAPHRIS) 10 MG SL Tab Place 10 mg under the tongue nightly.     [name] atenolol (TENORMIN) 100 MG tablet TAKE 1 TABLET (100 MG TOTAL) BY MOUTH DAILY. 90 tablet 1   . benztropine (COGENTIN) 1 MG tablet Take 1 mg by mouth as needed.         . cyclobenzaprine  (FLEXERIL) 5 MG tablet TAKE 1-2 TABLET TWICE A DAY AS NEEDED FOR MUSCLE SPASMS  0   . IRON PO Take 1 tablet by mouth daily.     [name] lisinopril (PRINIVIL,ZESTRIL) 20 MG tablet Take 1 tablet (20 mg total) by mouth daily. 90 tablet 0   . OXcarbazepine (TRILEPTAL) 600 MG tablet Take 600 mg by mouth. In the morning       . OXcarbazepine (TRILEPTAL) 600 MG tablet Take 600 mg by mouth nightly.        . QUEtiapine (SEROQUEL) 400 MG tablet Take 800 mg by mouth nightly.       . traZODone (DESYREL) 100 MG tablet Take 150 mg by mouth as needed.           No current facility-administered medications for this visit.      Subjective:  Pt returned nurse call and was excited about her 2 lb wt loss.  Pt has been doing Tai Chi.  Eating out a lot.   Pt has H/A and L arm pain when she consumes fried chicken.  Denies H/A today.  Pt stated that only urinary symptoms she has is frequency.  Thinking about seeing a doctor.    Taking all her meds as Rxed.  Pt is not forgetting to take her meds.     Goals:   Pt will lose 1lb q 2 wks.  Pt will choose to eat leaner meats.    Education Provided :    Educated pt about S & Ss of Hypertension and when to check her BP.  Educated pt about limiting ingestion of sodium and reviewed foods high in sodium.  Reviewed w/ pt BP cuffs and where to buy monitors.  Encouraged pt to log her BP and bring to doctor's visits.  Educated pt about S & Ss of a stroke and MI and when to call 911.  Review S & Ss of GERD, GERD triggers and ways to prevent GERD symptoms.  Educated pt about the positive benefits of exercise.  Reviewed ways to eat healthier when going out to eat.    Action Items:  Pt will exercise 10 min 2x/da 5days/wk.  Pt will look for healthier food options when eating out.    Next Call:  Review Urgent Care near her.  Educate pt about BP target numbers.  Educate pt about a portioned plate 16-10-96.    Pt is aware that she can call nurse.  Will continue to F/U w/ pt.  Basilia Jumbo, RN, BSN  Nurse  [employer]   T [phone]   Judie Petit [phone]

## 2016-11-22 ENCOUNTER — Other Ambulatory Visit (INDEPENDENT_AMBULATORY_CARE_PROVIDER_SITE_OTHER): Payer: Self-pay

## 2016-11-22 NOTE — Progress Notes (Signed)
Spoke w/ pt who stated that she went to the ED AT [facility] about 2 wks ago at 11:45 PM for urinary frequency.  Pt said that she didn't go to Urgent Care, because they ask her for $20 and she didn't have any money in her accounts.    She finished the ABX and denies any frequency, urgency or hematuria.  Pt didn't remember the name of the ABX.  She hasn't seen the Uro, but talked about needing to sched an appt.    Denied H/A. Pt hasn't checked her BP.  She said that she will check her BP at [facility].  Educated pt about BP target numbers.    Reviewed w/ pt about same day appts available at the PCP's office and the Urgent Cares near her.    Encouraged pt to drink water.  Pt is aware that pt can call nurse.  Will continue to F/U w/ pt.  Basilia Jumbo, RN, BSN  Nurse [employer]   T [phone]   Judie Petit [phone]

## 2016-11-25 ENCOUNTER — Other Ambulatory Visit (INDEPENDENT_AMBULATORY_CARE_PROVIDER_SITE_OTHER): Payer: Self-pay

## 2016-11-25 ENCOUNTER — Other Ambulatory Visit (INDEPENDENT_AMBULATORY_CARE_PROVIDER_SITE_OTHER): Payer: Self-pay | Admitting: Internal Medicine

## 2016-11-25 DIAGNOSIS — I10 Essential (primary) hypertension: Secondary | ICD-10-CM

## 2016-11-25 NOTE — Progress Notes (Signed)
Pt returned nurse call.  Pt stated that she has an appt w/ PCP on 4/9 to check on her L breast.  She wants to know if it's a lump.    Denies any symptoms of UTI.  Denies frequency, urgency, hematuria or back pain.    Reviewed w/ pt:  Certain foods and beverages might irritate your bladder, including:  . Coffee, tea and carbonated drinks, even without caffeine.  . Alcohol.  . Certain acidic fruits -- oranges, grapefruits, lemons and limes -- and fruit juices.  [name] Spicy foods.  . Tomato-based products.  . Carbonated drinks.  . Chocolate.    Encouraged pt to be aware of these foods and if it affects her bladder.    Next Call:  Check on Left breast.  Check on pt's financial situation and how she is affording her meds.  Need help w/ scheduling a Uro appt?    Reminded pt to call PCP's office w/ questions of her UTI symptoms.    Pt is aware that she can call nurse.  Will continue to F/U w/ pt.  Basilia Jumbo, RN, BSN  Nurse [employer]   T [phone]   Judie Petit [phone]

## 2016-11-25 NOTE — Telephone Encounter (Signed)
Refill sent.

## 2016-11-28 ENCOUNTER — Encounter (INDEPENDENT_AMBULATORY_CARE_PROVIDER_SITE_OTHER): Payer: Self-pay | Admitting: Internal Medicine

## 2016-11-28 ENCOUNTER — Ambulatory Visit (INDEPENDENT_AMBULATORY_CARE_PROVIDER_SITE_OTHER): Payer: Medicare Other | Admitting: Internal Medicine

## 2016-11-28 ENCOUNTER — Other Ambulatory Visit (INDEPENDENT_AMBULATORY_CARE_PROVIDER_SITE_OTHER): Payer: Self-pay | Admitting: Internal Medicine

## 2016-11-28 VITALS — BP 148/85 | HR 71 | Temp 98.3°F | Resp 20 | Wt 198.2 lb

## 2016-11-28 DIAGNOSIS — N6489 Other specified disorders of breast: Secondary | ICD-10-CM

## 2016-11-28 NOTE — Progress Notes (Signed)
Have you seen any specialists/other providers since your last visit with [place]?    No      Limb alert protocol reviewed?      Yes    No changes to family, medical or surgical history per pt.    Pap Smear and Mammo due. Pt notified.

## 2016-11-28 NOTE — Progress Notes (Signed)
Subjective:      Patient ID: Christina Torres is a 51 y.o. female  Chief Complaint   Patient presents with   . Breast Problem     Left feels slightly different than right; noticed 2 days ago        HPI    Here for concern about a problem with her left breast. She was doing self breast exam and felt different on the left compared to the right. Couldn't tell if it is a lump or not. Just felt different. Was told she had dense breasts when she had her last mammogram.   She was seen for a similar complaint last summer, but pt doesn't remember if this is the same or different. She never had diag mammo that was ordered, and unsure why.   She doesn't normally do breast exams, not sure why she did it this time. No pain.       The following portions of the patient's history were reviewed and updated as appropriate: current medications, allergies, past family history, past medical history, past social history, past surgical history and problem list.     Review of Systems   Constitutional: Negative.    Respiratory: Negative.    Cardiovascular: Negative.          BP 148/81   Pulse 68   Temp 98.3 F (36.8 C) (Oral)   Resp 20   Wt 89.9 kg (198 lb 3.2 oz)   LMP 11/04/2016 (Approximate)   SpO2 97%   BMI 34.02 kg/m    Objective:   Physical Exam   Constitutional: She appears well-developed and well-nourished. No distress.   Pulmonary/Chest: Effort normal. Right breast exhibits no inverted nipple, no mass, no nipple discharge, no skin change and no tenderness. Left breast exhibits no inverted nipple, no mass, no nipple discharge, no skin change and no tenderness. Breasts are symmetrical.       Lymphadenopathy:     She has no axillary adenopathy.        Right: No supraclavicular adenopathy present.        Left: No supraclavicular adenopathy present.   Neurological: She is alert.   Psychiatric: She has a normal mood and affect.   Nursing note and vitals reviewed.      Assessment:     1. Breast asymmetry  Mammo Digital Diagnostic  Bilateral W Cad         Plan:     51 y.o. female presents with concern for breast asymmetry. Not palpable on exam. Pt had similar concern last year.   -Diag mammo (pt also due for screening)  -reassured  -counseled on monthly self breast exams  -counseled on signs/symptoms to warrant re-evaluation     Return for follow up current symptoms as needed.     Gust Brooms, MD

## 2016-12-01 ENCOUNTER — Ambulatory Visit: Payer: Medicare Other

## 2016-12-05 ENCOUNTER — Ambulatory Visit: Payer: Medicare Other

## 2016-12-05 ENCOUNTER — Ambulatory Visit: Payer: Medicare Other | Attending: Internal Medicine

## 2016-12-05 DIAGNOSIS — N6489 Other specified disorders of breast: Secondary | ICD-10-CM

## 2016-12-06 ENCOUNTER — Ambulatory Visit (INDEPENDENT_AMBULATORY_CARE_PROVIDER_SITE_OTHER): Payer: Medicare Other | Admitting: Family

## 2016-12-06 ENCOUNTER — Encounter (INDEPENDENT_AMBULATORY_CARE_PROVIDER_SITE_OTHER): Payer: Self-pay | Admitting: Family

## 2016-12-06 VITALS — BP 154/91 | HR 74 | Temp 98.9°F | Resp 16 | Ht 64.0 in | Wt 198.4 lb

## 2016-12-06 DIAGNOSIS — L02213 Cutaneous abscess of chest wall: Secondary | ICD-10-CM

## 2016-12-06 MED ORDER — SULFAMETHOXAZOLE-TRIMETHOPRIM 800-160 MG PO TABS
1.0000 | ORAL_TABLET | Freq: Two times a day (BID) | ORAL | 0 refills | Status: AC
Start: 2016-12-06 — End: 2016-12-16

## 2016-12-06 NOTE — Progress Notes (Signed)
[facility] [place]    PROGRESS NOTE      Patient: Christina Torres   Date: 12/06/2016   MRN: [id number]     Past Medical History:   Diagnosis Date   . Anemia     PMH   . Aneurysm     2 aneurysm each side of nose. monitored closely   . Bipolar 1 disorder    . Eczema    . Fibroid tumor    . Headache    . Hypertension    . OSA (obstructive sleep apnea) 01/2016     Social History     Social History   . Marital status: Single     Spouse name: N/A   . Number of children: N/A   . Years of education: N/A     Occupational History   . Not on file.     Social History Main Topics   . Smoking status: Never Smoker   . Smokeless tobacco: Never Used   . Alcohol use No   . Drug use: No   . Sexual activity: Not Currently     Partners: Male     Other Topics Concern   . Not on file     Social History Narrative   . No narrative on file     Family History   Problem Relation Age of Onset   . Cancer Mother      colon   . Hypertension Mother    . Hypertension Father    . Diabetes Father    . Heart failure Father    . Breast cancer Neg Hx            ASSESSMENT/PLAN     Christina Torres is a 51 y.o. female    Chief Complaint   Patient presents with   . Cyst     Pt reports cyst on chest is inflammed and draining.         1. Abscess of chest wall  - sulfamethoxazole-trimethoprim (BACTRIM DS) 800-160 MG per tablet; Take 1 tablet by mouth 2 (two) times daily.for 10 days  Dispense: 20 tablet; Refill: 0  - Ambulatory referral to General Surgery  - Wound culture (IL/LC/QU)    New, recurrent worsening  Pt did not want to do I & D today due to pain but was agreeable to puncture with needle to express drainage for culture. Able to sent for culture today.   Likely staph due to odor. Will start bactrim and epsom salt soaks and referral given to general surgeon as likely encapsulated and will need surgery to remove to prevent reoccurrence.           Risk & Benefits of the new medication(s) if started at this visit, were explained to the patient (and  family) who verbalized understanding & they are in agreement to the treatment plan.     Patient (family) encouraged to contact me/clinical staff with any questions/concerns    MEDICATIONS     Current Outpatient Prescriptions   Medication Sig Dispense Refill   . asenapine Maleate (SAPHRIS) 10 MG SL Tab Place 10 mg under the tongue nightly.     [name] atenolol (TENORMIN) 100 MG tablet TAKE 1 TABLET (100 MG TOTAL) BY MOUTH DAILY. 90 tablet 1   . lisinopril (PRINIVIL,ZESTRIL) 20 MG tablet TAKE 1 TABLET (20 MG TOTAL) BY MOUTH DAILY. 90 tablet 1   . OXcarbazepine (TRILEPTAL) 600 MG tablet Take 600 mg by mouth 2 (two) times daily.Once  in morning and once in evening         . QUEtiapine (SEROQUEL) 400 MG tablet Take 800 mg by mouth nightly.       . traZODone (DESYREL) 100 MG tablet Take 150 mg by mouth as needed.         . benztropine (COGENTIN) 1 MG tablet Take 1 mg by mouth as needed.         . cyclobenzaprine (FLEXERIL) 5 MG tablet TAKE 1-2 TABLET TWICE A DAY AS NEEDED FOR MUSCLE SPASMS  0   . sulfamethoxazole-trimethoprim (BACTRIM DS) 800-160 MG per tablet Take 1 tablet by mouth 2 (two) times daily.for 10 days 20 tablet 0     No current facility-administered medications for this visit.        Allergies   Allergen Reactions   . Hctz [Hydrochlorothiazide] Other (See Comments)     Hyponatremia     . Penicillins Angioedema   . Clindamycin Rash   . Lamictal [Lamotrigine] Rash       SUBJECTIVE     Chief Complaint   Patient presents with   . Cyst     Pt reports cyst on chest is inflammed and draining.         HPI    Pt is here today with reports of infection on left chest wall. Pt reports over 30 years ago she was treated for a cyst that reoccurred in this same area and had it surgically removed. She has done well until 1 week ago when she started to notice infection again. It has progressively gotten worse and now painful, red and without drainage that has a strong odor. She reports otherwise she feels fine and no fevers or other  associated symptoms. Site is very painful. She has taken some ibuprofen and tried to express drainage herself but otherwise no treatment.    ROS     Review of Systems   Constitutional: Negative for activity change, appetite change, chills, diaphoresis, fatigue and fever.   HENT: Negative for congestion, mouth sores and sore throat.    Respiratory: Negative for cough, shortness of breath and wheezing.    Cardiovascular: Negative for chest pain and palpitations.   Gastrointestinal: Negative for abdominal pain, nausea and vomiting.   Musculoskeletal: Negative for joint swelling and myalgias.   Skin: Positive for wound. Negative for color change, pallor and rash.   Allergic/Immunologic: Negative for environmental allergies and food allergies.   Neurological: Negative for dizziness, weakness, light-headedness and numbness.         The following portions of the patient's history were reviewed and updated as appropriate: Allergies, Current Medications, Past Family History, Past Medical history, Past social history, Past surgical history, and Problem List.      PHYSICAL EXAM       Vitals:    12/06/16 1350 12/06/16 1351   BP: 142/88 (!) 154/91   BP Site: Left arm Left arm   Patient Position: Sitting Sitting   Pulse: 72 74   Resp: 16    Temp: 98.9 F (37.2 C)    TempSrc: Oral    SpO2: 98%    Weight: 90 kg (198 lb 6.4 oz)    Height: 1.626 m (5\' 4" )        Physical Exam   Nursing note and vitals reviewed.  Constitutional: She is oriented to person, place, and time. She appears well-developed and well-nourished. No distress.   HENT:   Head: Normocephalic and atraumatic.   Pulmonary/Chest: Effort normal.  Neurological: She is alert and oriented to person, place, and time.   Skin: She is not diaphoretic.   Psychiatric: She has a normal mood and affect. Her behavior is normal. Judgment and thought content normal.           Results for orders placed or performed in visit on 09/08/16   POCT UA Dipstix (10)(Multi-Test)   Result  Value Ref Range    POCT Spec Gravity, UA 1.020 1.001 - 1.035    POCT pH, UA 6.0 5 - 8    Glucose, UA POCT Negative Negative mg/dL    Protein, UA POCT Trace (A) Negative mg/dL    Ketones, UA POCT Trace (A) Negative mg/dL    Blood, UA POCT Negative Negative, Trace    POCT Leukocytes, UA 1+ (A) Negative    Nitrite, UA POCT Negative Negative    Bilirubin, UA POCT Small (A) Negative    Urobilinogen, UA 0.2 0.2, 1.0, 2.0 mg/dL       Signed,  Balinda Quails, FNP-c  12/06/2016

## 2016-12-06 NOTE — Progress Notes (Signed)
Have you seen any specialists/other providers since your last visit with us?    No      Limb alert protocol reviewed?      Yes

## 2016-12-06 NOTE — Patient Instructions (Signed)
Abscess (Incision & Drainage) this needs to be surgically removed to prevent this from coming back.   Cellulitis  Cellulitis is an infection of the deep layers of skin. A break in the skin, such as a cut or scratch, can let bacteria under the skin. If the bacteria get to deep layers of the skin, it can be serious. If not treated, cellulitis can get into the bloodstream and lymph nodes. The infection can then spread throughout the body. This causes serious illness.  Cellulitis causes the affected skin to become red, swollen, warm, and sore. The reddened areas have a visible border. An open sore may leak fluid (pus). You may have a fever, chills, and pain.  Cellulitis is treated with antibiotics taken for 7 to 10 days. An open sore may be cleaned and covered with cool wet gauze. Symptoms should get better 1 to 2 days after treatment is started. Make sure to take all the antibiotics for the full number of days until they are gone. Keep taking the medicine even if your symptoms go away.  Home care  Follow these tips:   Limit the use of the part of your body with cellulitis.   If the infection is on your leg, keep your leg raised while sitting. This will help to reduce swelling.   Take all of the antibiotic medicine exactly as directed until it is gone. Do not miss any doses, especially during the first 7 days. Don't stop taking the medicine when your symptoms get better.   Keep the affected area clean and dry.   Wash your hands with soap and warm water before and after touching your skin. Anyone else who touches your skin should also wash his or her hands. Don't share towels.  Follow-up care  Follow up with your healthcare provider, or as advised. If your infection does not go away on the first antibiotic, your healthcare provider will prescribe a different one.  When to seek medical advice  Call your healthcare provider right away if any of these occur:   Red areas that spread   Swelling or pain that gets  worse   Fluid leaking from the skin (pus)   Fever higher of 100.4 F (38.0 C) or higher after 2 days on antibiotics  Date Last Reviewed: 04/23/2015   2000-2016 [employer] [address] [place], [place] [address]. All rights reserved. This information is not intended as a substitute for professional medical care. Always follow your healthcare professional's instructions.        An abscess (sometimes called a "boil") occurs when bacteria get trapped under the skin and start to grow. Pus forms inside the abscess as the body responds to the bacteria. An abscess can happen with an insect bite, ingrown hair, blocked oil gland, pimple, cyst, or puncture wound.  Your healthcare provider has drained the pus from your abscess. If the abscess pocket was large, your healthcare provider may have inserted gauze packing. Your provider will need to remove and possibly replace it on your next visit. You may not need antibiotics to treat a simple abscess, unless the infection is spreading into the skin around the wound (cellulitis).  Healing of the wound will take about 1 to 2 weeks, depending on the size of the abscess. Healthy tissue will grow from the bottom and sides of the opening until it seals over.  Home care  These tips can help your wound heal:   The wound may drain for the  first 2 days. Cover the wound with a clean dry dressing. If the dressing becomes soaked with blood or pus, change it.   If a gauze packing was placed inside the abscess cavity, you may be told to remove it yourself. You may do this in the shower. Once the packing is removed, you should wash the area in the shower or bath 3 to 4 times a day, until the skin opening has closed. Make sure you wash your hands after changing the packing or cleaning the wound.   If you were prescribed antibiotics, take them as directed until they are all gone.   You may use acetaminophen or ibuprofen to control pain, unless another pain medicine was  prescribed.If you have liver disease or ever had a stomach ulcer, talk with your doctor before using these medicines.  Follow-up care  Follow up with your healthcare provider, or as advised. If a gauze packing was inserted in your wound, it should be removed in 1 to 2 days. Check your wound every day for the signs of worsening infection listed below.  When to seek medical advice  Call your healthcare provider right away if any of these occur:   Increasing redness or swelling   Red streaks in the skin leading away from the wound   Increasing local pain or swelling   Continued pus draining from the wound 2 days after treatment   Fever of 100.13F (38C) or higher, or as directed by your healthcare provider   Boil returns when you are at home  Date Last Reviewed: 04/23/2015   2000-2016 [employer]. [address] [place], [place] [address]. All rights reserved. This information is not intended as a substitute for professional medical care. Always follow your healthcare professional's instructions.

## 2016-12-09 ENCOUNTER — Telehealth (INDEPENDENT_AMBULATORY_CARE_PROVIDER_SITE_OTHER): Payer: Self-pay | Admitting: Internal Medicine

## 2016-12-09 NOTE — Telephone Encounter (Signed)
Patient called- says she is feeling better.  Cyst is going away.  Patient has been taking antibiotics and yogurt and following Marchelle Folks Dickey's instructions, is there still a need to see the general surgeon where she was referred?     Please advise.  Thanks  Patient call back #: [phone]

## 2016-12-09 NOTE — Telephone Encounter (Signed)
LVM to inform pt she will need to still see the general surgeon to remove the cyst per Claris Gower, NP.

## 2017-01-09 ENCOUNTER — Other Ambulatory Visit (INDEPENDENT_AMBULATORY_CARE_PROVIDER_SITE_OTHER): Payer: Self-pay

## 2017-01-09 ENCOUNTER — Ambulatory Visit (INDEPENDENT_AMBULATORY_CARE_PROVIDER_SITE_OTHER): Payer: Medicare Other

## 2017-01-09 DIAGNOSIS — Z111 Encounter for screening for respiratory tuberculosis: Secondary | ICD-10-CM

## 2017-01-09 NOTE — Progress Notes (Signed)
Vaccination administration:  Patient presented to the office for the following vaccination administration:    PPD    No reactions were noted and patient left in good condition.

## 2017-01-09 NOTE — Progress Notes (Signed)
Order placed

## 2017-01-09 NOTE — Progress Notes (Signed)
Patient is on the nurse schedule for a ppd test.  Please place order.  Thank you.

## 2017-01-11 ENCOUNTER — Ambulatory Visit (INDEPENDENT_AMBULATORY_CARE_PROVIDER_SITE_OTHER): Payer: Medicare Other

## 2017-01-11 DIAGNOSIS — Z111 Encounter for screening for respiratory tuberculosis: Secondary | ICD-10-CM

## 2017-01-11 NOTE — Progress Notes (Signed)
Patient presented to the office for PPD reading.  Results were Negative.    Results relayed to physician for recommended follow-up.

## 2017-01-20 ENCOUNTER — Encounter (INDEPENDENT_AMBULATORY_CARE_PROVIDER_SITE_OTHER): Payer: Self-pay | Admitting: Internal Medicine

## 2017-02-08 ENCOUNTER — Other Ambulatory Visit (INDEPENDENT_AMBULATORY_CARE_PROVIDER_SITE_OTHER): Payer: Self-pay | Admitting: Internal Medicine

## 2017-02-08 DIAGNOSIS — I10 Essential (primary) hypertension: Secondary | ICD-10-CM

## 2017-02-08 NOTE — Telephone Encounter (Signed)
Refill sent.

## 2017-04-17 ENCOUNTER — Other Ambulatory Visit (INDEPENDENT_AMBULATORY_CARE_PROVIDER_SITE_OTHER): Payer: Self-pay

## 2017-04-17 NOTE — Progress Notes (Signed)
Pt called in  And left vm message and stated that she left a message at the nurse line at the [facility] office.    Called pt back and Spoke w/ pt who stated that she has urinary frequency, but no dysuria.  Scheduled this pt on Dr Franklyn Lor 11 AM [facility] slot tomorrow that was still open.  Pt thinks that she has a UTI and was saying that she was going to the ED if she couldn't see someone at [facility].  She stated that she doesn't have the co-pay for the Urgent Care.    NN called 2 [facility] sites.  Spoke w/ Morrie Sheldon at  [facility] site who stated that Medicare pts would need to give a credit card, but cash would not be expected fm pt.  Morrie Sheldon did finally say that there would be a copay for an Urgent Care visit.    Basilia Jumbo, RN, BSN  Nurse [employer]   T [phone]   Judie Petit [phone]

## 2017-04-18 ENCOUNTER — Encounter (INDEPENDENT_AMBULATORY_CARE_PROVIDER_SITE_OTHER): Payer: Self-pay | Admitting: Family Medicine

## 2017-04-18 ENCOUNTER — Ambulatory Visit (INDEPENDENT_AMBULATORY_CARE_PROVIDER_SITE_OTHER): Payer: Medicare Other | Admitting: Family Medicine

## 2017-04-18 VITALS — BP 144/84 | HR 76 | Temp 98.9°F | Resp 18 | Ht 64.0 in | Wt 197.0 lb

## 2017-04-18 DIAGNOSIS — N3000 Acute cystitis without hematuria: Secondary | ICD-10-CM

## 2017-04-18 DIAGNOSIS — R350 Frequency of micturition: Secondary | ICD-10-CM

## 2017-04-18 LAB — POCT URINALYSIS AUTOMATED (IAH)
Bilirubin, UA POCT: NEGATIVE
Glucose, UA POCT: NEGATIVE
Ketones, UA POCT: NEGATIVE mg/dL
Nitrite, UA POCT: NEGATIVE
PH, UA POCT: 6 (ref 4.6–8)
Protein, UA POCT: POSITIVE mg/dL — AB
Specific Gravity, UA POCT: >=1.03 mg/dL (ref 1.001–1.035)
Urobilinogen, UA POCT: 0.2 mg/dL

## 2017-04-18 MED ORDER — SULFAMETHOXAZOLE-TRIMETHOPRIM 800-160 MG PO TABS
1.0000 | ORAL_TABLET | Freq: Two times a day (BID) | ORAL | 0 refills | Status: AC
Start: 2017-04-18 — End: 2017-04-21

## 2017-04-18 NOTE — Progress Notes (Signed)
Have you seen any specialists/other providers since your last visit with us?    No    Arm preference verified?   Yes    The patient is due for pap smear and shingles vaccine

## 2017-04-18 NOTE — Progress Notes (Signed)
Subjective:      Patient ID: Christina Torres  is a 51 y.o.  female.     Chief Complaint   Patient presents with   . Urinary Tract Infection Symptoms     Pt. states her symptoms started the day before yesterday at night. Symptoms include: frequent urinations which has slowed up a little bit.       Urinary Tract Infection Symptoms    This is a new problem. Episode onset: 2 d. The problem occurs intermittently. The problem has been gradually improving. The patient is experiencing no pain. There has been no fever. She is not sexually active. There is no history of pyelonephritis. Associated symptoms include frequency. Pertinent negatives include no chills, flank pain, hesitancy, nausea or urgency. She has tried nothing for the symptoms. There is no history of recurrent UTIs (last one a year ago).       The following sections were reviewed this encounter by the provider:   Tobacco  Allergies  Meds  Problems  Med Hx  Surg Hx  Fam Hx  Soc Hx         Review of Systems   Constitutional: Negative for chills and fever.   Gastrointestinal: Negative.  Negative for nausea.   Genitourinary: Positive for frequency. Negative for decreased urine volume, dysuria, flank pain, hesitancy and urgency.        See HPI         BP 144/84 (BP Site: Left arm, Patient Position: Sitting, Cuff Size: Medium)   Pulse 76   Temp 98.9 F (37.2 C) (Oral)   Resp 18   Ht 1.626 m (5\' 4" )   Wt 89.4 kg (197 lb)   LMP 04/18/2017 (Exact Date)   SpO2 98%   BMI 33.81 kg/m     Objective:   Physical Exam   Constitutional: She appears well-developed and well-nourished.   Abdominal: There is no tenderness. There is no CVA tenderness.   Vitals reviewed.       Assessment:   1. Acute cystitis without hematuria  - sulfamethoxazole-trimethoprim (BACTRIM DS) 800-160 MG per tablet; Take 1 tablet by mouth 2 (two) times daily.for 3 days  Dispense: 6 tablet; Refill: 0    2. Urinary frequency  - POCT UA Clinitek AX (urine dipstick)       new (low risk/MDM)  Plan:    Urine POCT trace LE  Treat with 3 d PO abx  Increase fluids  otc AZO/cranberry prn  Risk & Benefits of any new medication(s) were explained to the patient who verbalized understanding & agreed to the treatment plan.     Call if symptoms persist, worsen, or change.  Call with updates/questions/concerns.    Christina Torres, D.O.

## 2017-05-22 ENCOUNTER — Other Ambulatory Visit (INDEPENDENT_AMBULATORY_CARE_PROVIDER_SITE_OTHER): Payer: Self-pay

## 2017-05-22 ENCOUNTER — Other Ambulatory Visit (INDEPENDENT_AMBULATORY_CARE_PROVIDER_SITE_OTHER): Payer: Self-pay | Admitting: Internal Medicine

## 2017-05-22 DIAGNOSIS — I10 Essential (primary) hypertension: Secondary | ICD-10-CM

## 2017-05-22 NOTE — Progress Notes (Signed)
F/U for Care Coordination    Pt called in and stated that she wanted to see Dr Otilio Jefferson.  (Has Frequent ED visits because she can't afford co-pay at Urgent Care)  Complaining of white spot on R shoulder that has spread to several white spots on bil arms - areas that are exposed to the sun.  Started 1 wk ago.  Denies itching.  Skin intact.    F/U on cystitis fm 04/18/17 visit.  Denies dysuria, hematuria, back pain or fever.   Still has urinary frequency, because of increased water intake said pt.  Urine - clear.  Finished all her ABX except for 1 tablet.    Scheduled pt an Appt w/ Dr Otilio Jefferson on 05/23/17 12:45 PM.    Pt is aware of the Urgent Cares near her.    Pt is aware that she can call nurse.  Will continue to F/U w/ pt.  Basilia Jumbo, RN, BSN  Nurse [employer]   T [phone]   Judie Petit [phone]

## 2017-05-22 NOTE — Telephone Encounter (Signed)
Refill sent.

## 2017-05-23 ENCOUNTER — Encounter (INDEPENDENT_AMBULATORY_CARE_PROVIDER_SITE_OTHER): Payer: Self-pay | Admitting: Internal Medicine

## 2017-05-23 ENCOUNTER — Ambulatory Visit (INDEPENDENT_AMBULATORY_CARE_PROVIDER_SITE_OTHER): Payer: Medicare Other | Admitting: Internal Medicine

## 2017-05-23 VITALS — BP 136/75 | HR 79 | Temp 99.0°F | Ht 64.5 in | Wt 201.8 lb

## 2017-05-23 DIAGNOSIS — L818 Other specified disorders of pigmentation: Secondary | ICD-10-CM

## 2017-05-23 NOTE — Progress Notes (Signed)
Subjective:      Patient ID: Christina Torres is a 51 y.o. female  Chief Complaint   Patient presents with   . skin spots     x 1 week        Rash   This is a new problem. The current episode started in the past 7 days. The problem has been gradually worsening since onset. The affected locations include the left arm and right arm. Rash characteristics: white spots. She was exposed to nothing. Pertinent negatives include no cough, fever, rhinorrhea or sore throat. Past treatments include nothing.     Noticed white spots developing on her skin. Mostly on her arms. No itching, dryness, scaling, pain. Not raised. Wants to make sure it's not vitiligo.     The following portions of the patient's history were reviewed and updated as appropriate: current medications, allergies, past family history, past medical history, past social history, past surgical history and problem list.     Review of Systems   Constitutional: Negative for fever.   HENT: Negative for rhinorrhea and sore throat.    Respiratory: Negative for cough.    Skin: Positive for rash.         BP 136/75   Pulse 79   Temp 99 F (37.2 C) (Oral)   Ht 1.638 m (5' 4.5")   Wt 91.5 kg (201 lb 12.8 oz)   LMP 05/16/2017 (Approximate)   SpO2 98%   BMI 34.10 kg/m    Objective:   Physical Exam   Constitutional: She appears well-developed and well-nourished. No distress.   Pulmonary/Chest: Effort normal.   Neurological: She is alert.   Skin:        Psychiatric: She has a normal mood and affect.   Nursing note and vitals reviewed.      Assessment:     1. Idiopathic guttate hypomelanosis           Plan:     51 y.o. female presents with skin lesions consistent with idiopathic guttate hypomelanosis. No sign of vitiligo, pityriasis alba, tinea versicolor, etc.   -reassured benign nature and showed pictures online of the different hypopigmented lesions  -encouraged sunscreen  -counseled on signs/symptoms to warrant re-evaluation     Return for follow up current symptoms as  needed.     Gust Brooms, MD

## 2017-05-23 NOTE — Progress Notes (Signed)
Have you seen any specialists/other providers since your last visit with us?    No    Arm preference verified?   Yes    The patient is due for pap smear and shingles vaccine

## 2017-05-30 ENCOUNTER — Encounter (INDEPENDENT_AMBULATORY_CARE_PROVIDER_SITE_OTHER): Payer: Self-pay | Admitting: Internal Medicine

## 2017-08-03 ENCOUNTER — Other Ambulatory Visit (INDEPENDENT_AMBULATORY_CARE_PROVIDER_SITE_OTHER): Payer: Self-pay

## 2017-08-03 NOTE — Progress Notes (Signed)
F/U for Care Management    Pt left a message saying that she was told that there was no more OV appts available today and was asking to get an appt.    Returned pt's call and left a message verifying that there is no more available OV appts today and encouraged pt to keep the appt she has for tomorrow at 11 AM w/ Dr Franklyn Lor.    Called [facility] to ask why OV appt for tomorrow was missing when NN was closing chart. Spoke w/ Wynona Canes who stated that pt called back and cancelled her appt for tomorrow because she wanted to be seen today.    Pt is aware that she can call nurse.  Will continue to F/U w/ pt.  Basilia Jumbo, RN, BSN CCM  Nurse [employer]   T [phone]   Judie Petit [phone]

## 2017-08-03 NOTE — Progress Notes (Addendum)
F/U for Care Management    Received message fm Dr Otilio Jefferson that pt was in the ED and needs an appt w/ PCP and Neurologist.    Called pt's cell and left a message encouraging pt to sched OV appt  w/ PCP and Neurologist.  Informed pt of 2 available slots for tomorrow's schedule at [facility].    Pt returned call and stated that she went to the Urgent Care at 10:30 this AM for a H/A and was told to go to the ED because of her hx of aneurysm.  (Pt said that she went to the Urgent Care because she needed a doctor's note for today).  ED -[facility]  Was given Tylenol for a H/A of 6 out of 10 pain and was told to see Neurologist.  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    Right now H/A is a 3 out of 10.  Location: back and top of head.  Denies vision changes, N, V, numbness, weakness or pain behind or above her eye.     Encouraged pt to call Neurologist and sched an appt.    Scheduled pt a Hosp F/U w/ Dr Boneta Lucks on 12/14 at 1:30 PM.    On 08/01/17 10:49 PM at [facility] to ED at [facility]  For Atypical chest pain & Fatigue  Drank a Detox drink - started feeling weak.  Felt better after IV fluids and was sent home and instructed to hydrate.    Pt is aware that she can call nurse.  Will continue to F/U w/ pt.  Basilia Jumbo, RN, BSN CCM  Nurse [employer]   T [phone]   Judie Petit [phone]

## 2017-08-04 ENCOUNTER — Ambulatory Visit (INDEPENDENT_AMBULATORY_CARE_PROVIDER_SITE_OTHER): Payer: Medicare Other | Admitting: Internal Medicine

## 2017-08-04 ENCOUNTER — Inpatient Hospital Stay (INDEPENDENT_AMBULATORY_CARE_PROVIDER_SITE_OTHER): Payer: Medicare Other | Admitting: Family Medicine

## 2017-08-04 ENCOUNTER — Encounter (INDEPENDENT_AMBULATORY_CARE_PROVIDER_SITE_OTHER): Payer: Self-pay | Admitting: Internal Medicine

## 2017-08-04 VITALS — BP 135/61 | HR 77 | Temp 98.6°F | Resp 14 | Ht 64.5 in | Wt 201.0 lb

## 2017-08-04 DIAGNOSIS — R51 Headache: Secondary | ICD-10-CM

## 2017-08-04 DIAGNOSIS — R9431 Abnormal electrocardiogram [ECG] [EKG]: Secondary | ICD-10-CM

## 2017-08-04 DIAGNOSIS — I10 Essential (primary) hypertension: Secondary | ICD-10-CM

## 2017-08-04 DIAGNOSIS — R079 Chest pain, unspecified: Secondary | ICD-10-CM

## 2017-08-04 DIAGNOSIS — R519 Headache, unspecified: Secondary | ICD-10-CM

## 2017-08-04 MED ORDER — ATENOLOL 100 MG PO TABS
100.00 mg | ORAL_TABLET | Freq: Every day | ORAL | 1 refills | Status: DC
Start: 2017-08-04 — End: 2018-01-17

## 2017-08-04 MED ORDER — LISINOPRIL 20 MG PO TABS
20.0000 mg | ORAL_TABLET | Freq: Every day | ORAL | 1 refills | Status: DC
Start: 2017-08-04 — End: 2018-01-17

## 2017-08-04 NOTE — Patient Instructions (Signed)
Stable Angina  The chest discomfort you have appears to be coming from your heart -- a condition called angina. Angina is a pain in the heart due to poor blood flow to the heart muscle. This can occur when plaque builds up on the artery wall or a blood clot forms in one or more of the small blood vessels that deliver oxygen to the heart muscle. Plaque is a fatty material made up of cholesterol, calcium deposits, and other materials.  Exercise, increased activity, emotional upset, or stress can trigger this pain. With proper treatment and lifestyle changes to reduce risk factors, most people with angina are able to maintain a full and active life.  Angina is not a heart attack. But if angina pain is severe or prolonged, it is a sign ofan impending heart attack, also called acute myocardial infarction, or AMI. Your angina is under control at this time. Therefore, it is safe for you to go home. Follow up as instructed by your doctor for any further tests and office visits.    Home care   Rest at home today and avoid any emotional or physically strenuous activity.   Take medicine (usually nitroglycerin) for chest pain exactly as prescribed. Keep your nitroglycerin with you at all times.   When taking nitroglycerin for angina, sit or lie down. The medicine may make you feel dizzy.   Place one tablet under your tongue, or between your lip and gum, or between your cheek and gum. Let the tablet dissolve completely; do not chew or swallow the tablet.   If you use a spray, then spray once on orunder your tongue. Do not inhale. Right after you use the spray, close your mouth. Wait a few seconds before you swallow.   After taking one tablet or spraying once, continue sitting or lying for 5 minutes.   If the angina goes away completely, rest awhile and continue your normal routine.  Note: Your healthcare provider may give you slightly different instructions than those above. If so, follow them  carefully.  Prevention   Learn how to take your own blood pressure. Keep a record of your results. Ask your doctor which readings mean that you need medical attention. Reduce salt intake and follow lifestyle change instructions if you have high blood pressure (hypertension).   Maintain a healthy weight. Get help to lose any extra pounds. Talk to your doctor about a safe diet program. Sudden large weight losses can be dangerous.   If you have diabetes, talk to your doctor about healthy control of your blood sugar.   Begin an exercise program. Ask your doctor how to get started. You can benefit from simple activities such as walking or gardening. Short, high intensity exercise sessions may also be beneficial.   Break the smoking habit. Enroll in a stop-smoking program to improve your chances of success.   Get adequate rest.   Avoid stressful situations. Learn stress-management techniques.  Follow-up care  Followup with your doctor, or as advised.  If an X-Altamirano wasdone , it will be reviewed by another specialist. You will be notified of any new findings that may affect your care.  Call 911  This is the fastest and safest way to get to the nearest emergency department. The paramedics can also start treatment on the way to the hospital, saving valuable time for your heart.   Ifangina gets worse, it continues, or if it stops and returns, call 911 immediately, Do not delay. You may be having   a heart attack.   After you call 911, take a second nitroglycerine tablet or spray unless instructed otherwise. When repeating doses, sit down if possible because it can make you feel lightheaded or dizzy. Wait another 5 minutes. If the angina still does not go away, take a third tablet or spray. Do not take more than 3 tablets or sprays within 15 minutes. Stay on the phone with 911 for further instructions.   Your healthcare provider may give you slightly different instructions than those above. If so, follow them  carefully.  Don'twait until your symptoms are severe to call 911. Other reasons to call 911 besides chest pain include:   Trouble breathing   Feeling lightheaded, faint, or dizzy   Rapid heart beat   Slower than usual heart rate compared to your normal   Angina with weakness, dizziness, fainting, heavy sweating, nausea, or vomiting   Extreme drowsiness, or confusion   Weakness of an arm or leg or on one side of the face   Difficulty with speech or vision  When to seek medical advice  Remember, the signs and symptoms of a heart attack are not always like they are on TV. Sometimes they are not so obvious. You may only feel weak or just "not right." If it is not clear or if you have any doubt, call for advice.   Seek help if there is a change in the type of pain, if it feels different, or if your symptoms are mild.   Do not drive yourself. Have someone else drive. If no one can drive you, call 911.   If your doctor has given you medicine to take when you have symptoms, take them, but do not delay getting help.   Do not delay. Fast diagnosis and treatment can prevent or limit the amount of heart damage during a heart attack or stroke.   Do not go to your doctor's office or a clinic because they may not be able to provide all the testing and treatment you need.  Date Last Reviewed: 08/20/2014   2000-2016 [employer] [address], [place] [address]. All rights reserved. This information is not intended as a substitute for professional medical care. Always follow your healthcare professional's instructions.
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
## 2017-08-07 ENCOUNTER — Other Ambulatory Visit (INDEPENDENT_AMBULATORY_CARE_PROVIDER_SITE_OTHER): Payer: Self-pay

## 2017-08-07 NOTE — Progress Notes (Signed)
F/U for Care Management    Pt was in the ED at [facility] on 08/05/17 for chest pain dx as chest wall pain.  Pt has been in the ED 3x in the last 5 days.  12/12 - Fatigue  12/13- H/A  12/15 - Chest pain.    Pt called in and left message that she called Card/ Dr Heide Scales for an appt, but was told that only available is in Feb.  Asking NN is she can get an earlier appt.    NN called Card/Dr Kelli Hope   [phone]  Spoke w/ Shanda Bumps who stated that [facility] location first available is Feb.  Scheduled pt in [place] for Thursday 12/20 at 10:30 AM.  Jan 7th would be the next available new pt appt.    Called pt and left vm message informing her of the above appt. Encouraged her to call Dr Dion Body if she wants to cancel or resched.    Pt returned NN call.  Pt said that she has:  No C/P  No SOB   No N or V  No H/A    Pt said that she will call Dr Dion Body and resched the appt to Jan 7th.    Education  Encouraged pt to call PCP's office or on call doctor to talk about her symptoms.  Encouraged pt to go to Urgent Care if it's not an emergency.    Updated PCP.     Pt is aware that she can call nurse.  Will continue to F/U w/ pt.  Basilia Jumbo, RN, BSN CCM  Nurse [employer]   T [phone]   Judie Petit [phone]

## 2017-08-10 ENCOUNTER — Ambulatory Visit (INDEPENDENT_AMBULATORY_CARE_PROVIDER_SITE_OTHER): Payer: Medicare Other | Admitting: Cardiovascular Disease

## 2017-08-21 ENCOUNTER — Ambulatory Visit (INDEPENDENT_AMBULATORY_CARE_PROVIDER_SITE_OTHER): Payer: Medicare Other | Admitting: Internal Medicine

## 2017-08-21 ENCOUNTER — Encounter (INDEPENDENT_AMBULATORY_CARE_PROVIDER_SITE_OTHER): Payer: Self-pay | Admitting: Internal Medicine

## 2017-08-21 VITALS — BP 155/79 | HR 82 | Temp 98.5°F | Resp 16 | Ht 64.0 in | Wt 200.0 lb

## 2017-08-21 DIAGNOSIS — R05 Cough: Secondary | ICD-10-CM

## 2017-08-21 DIAGNOSIS — R059 Cough, unspecified: Secondary | ICD-10-CM

## 2017-08-21 MED ORDER — DOXYCYCLINE HYCLATE 100 MG PO CAPS
100.00 mg | ORAL_CAPSULE | Freq: Two times a day (BID) | ORAL | 0 refills | Status: AC
Start: 2017-08-21 — End: 2017-08-28

## 2017-08-21 NOTE — Progress Notes (Signed)
Have you seen any specialists/other providers since your last visit with [place]?    Yes  Urgent Care 2 weeks ag  Arm preference verified?   Yes    The patient is due for [facility] Care Plan, [facility]

## 2017-08-21 NOTE — Progress Notes (Signed)
Subjective:      Date: 08/21/2017 12:15 PM   Patient ID: Christina Torres is a 51 y.o. female.    Chief Complaint:  Chief Complaint   Patient presents with   . Cough     x 1.5 weeks/ clear mucous/ using otc cough and cold meds/ able to sleep at night     Cough   This is a new problem. The current episode started 1 to 4 weeks ago. The problem has been waxing and waning. The cough is productive of sputum. Associated symptoms include chills, nasal congestion, postnasal drip, rhinorrhea and shortness of breath. Pertinent negatives include no chest pain, headaches, sore throat or wheezing. The symptoms are aggravated by cold air. She has tried OTC cough suppressant for the symptoms. The treatment provided no relief. Her past medical history is significant for asthma. There is no history of COPD or environmental allergies.           Problem List:  Patient Active Problem List   Diagnosis   . Essential hypertension   . Bipolar 1 disorder   . Iron deficiency anemia due to chronic blood loss   . Fibroid uterus   . Cerebral aneurysm   . Obstructive sleep apnea syndrome       Current Medications:  Current Outpatient Prescriptions   Medication Sig Dispense Refill   . asenapine Maleate (SAPHRIS) 10 MG SL Tab Place 10 mg under the tongue nightly.     [name] atenolol (TENORMIN) 100 MG tablet Take 1 tablet (100 mg total) by mouth daily. 90 tablet 1   . benztropine (COGENTIN) 1 MG tablet Take 1 mg by mouth as needed.         . cyclobenzaprine (FLEXERIL) 5 MG tablet TAKE 1-2 TABLET TWICE A DAY AS NEEDED FOR MUSCLE SPASMS  0   . IRON PO Take by mouth.     [name] lisinopril (PRINIVIL,ZESTRIL) 20 MG tablet Take 1 tablet (20 mg total) by mouth daily. 90 tablet 1   . OXcarbazepine (TRILEPTAL) 600 MG tablet Take 600 mg by mouth 2 (two) times daily.Once in morning and once in evening         . QUEtiapine (SEROQUEL) 400 MG tablet Take 800 mg by mouth nightly.       . traZODone (DESYREL) 100 MG tablet Take 150 mg by mouth as needed.         . doxycycline  (VIBRAMYCIN) 100 MG capsule Take 1 capsule (100 mg total) by mouth 2 (two) times daily.for 7 days 14 capsule 0     No current facility-administered medications for this visit.        Allergies:  Allergies   Allergen Reactions   . Hctz [Hydrochlorothiazide] Other (See Comments)     Hyponatremia     . Penicillins Angioedema   . Clindamycin Rash   . Lamictal [Lamotrigine] Rash       Past Medical History:  Past Medical History:   Diagnosis Date   . Anemia     PMH   . Aneurysm     2 aneurysm each side of nose. monitored closely   . Bipolar 1 disorder    . Eczema    . Fibroid tumor    . Headache    . Hypertension    . OSA (obstructive sleep apnea) 01/2016       Past Surgical History:  Past Surgical History:   Procedure Laterality Date   . COLONOSCOPY     . LAPAROSCOPIC, LYSIS,  ADHESIONS  07/28/2015    Procedure: LAPAROSCOPIC, LYSIS, ADHESIONS;  Surgeon: Dondra Prader, MD;  Location: [place] MAIN OR;  Service: Gynecology;;  Robotic laparoscopic lysis of pelvic and rectosigmoid adhesions   . MYOMECTOMY     . ROBOTIC, MYOMECTOMY N/A 07/28/2015    Procedure: ROBOT ASSISTED, LAPAROSCOPIC, MYOMECTOMY;  Surgeon: Dondra Prader, MD;  Location: [place] MAIN OR;  Service: Gynecology;  Laterality: N/A;  ROBOTIC MYOMECTOMY (LAP)     . UTERINE FIBROID EMBOLIZATION         Family History:  Family History   Problem Relation Age of Onset   . Cancer Mother         colon   . Hypertension Mother    . Hypertension Father    . Diabetes Father    . Heart failure Father    . Breast cancer Neg Hx        Social History:  Social History     Social History   . Marital status: Single     Spouse name: N/A   . Number of children: N/A   . Years of education: N/A     Occupational History   . Not on file.     Social History Main Topics   . Smoking status: Never Smoker   . Smokeless tobacco: Never Used   . Alcohol use No   . Drug use: No   . Sexual activity: Not Currently     Partners: Male     Other Topics Concern   . Not on file     Social  History Narrative   . No narrative on file       The following sections were reviewed this encounter by the provider:   Tobacco  Allergies  Meds  Problems  Med Hx  Surg Hx  Fam Hx  Soc Hx          Vitals:  BP 155/79 (BP Site: Left arm, Patient Position: Sitting, Cuff Size: Large)   Pulse 82   Temp 98.5 F (36.9 C) (Oral)   Resp 16   Ht 1.626 m (5\' 4" )   Wt 90.7 kg (200 lb)   LMP 08/09/2017   BMI 34.33 kg/m       ROS:  Review of Systems   Constitutional: Positive for appetite change and chills.   HENT: Positive for postnasal drip and rhinorrhea. Negative for sore throat.    Respiratory: Positive for cough and shortness of breath. Negative for wheezing.    Cardiovascular: Negative for chest pain.   Gastrointestinal: Negative for nausea and vomiting.   Allergic/Immunologic: Negative for environmental allergies.   Neurological: Negative for headaches.          Objective:       Physical Exam:  Physical Exam   Constitutional: She appears well-developed and well-nourished. No distress.   HENT:   Head: Normocephalic and atraumatic.   Right Ear: External ear normal.   Left Ear: External ear normal.   Eyes: Pupils are equal, round, and reactive to light. Conjunctivae and EOM are normal. Right eye exhibits no discharge. Left eye exhibits no discharge.   Neck: Neck supple.   Cardiovascular: Normal rate, regular rhythm and normal heart sounds.    Pulmonary/Chest: Effort normal and breath sounds normal. No respiratory distress.   Abdominal: Soft. She exhibits no distension. There is no tenderness.   Lymphadenopathy:     She has no cervical adenopathy.   Skin: Skin is warm and dry. Capillary  refill takes less than 2 seconds.   Nursing note and vitals reviewed.         Assessment/Plan:       1. Cough  - doxycycline (VIBRAMYCIN) 100 MG capsule; Take 1 capsule (100 mg total) by mouth 2 (two) times daily.for 7 days  Dispense: 14 capsule; Refill: 0    Patient is 51 y.o. y/o female here for URI symptoms.  Discussed with  the patient the indications for ABX therapy and to try and wait until 14+ days with worsening symptoms before starting abx.  Advised that adequate rest and hydration is important to help with relieving symptoms and to help prevent worsening symptoms. Recommend increasing fluid intake to 1-2 L per day to help loosen secretions. Warm facial packs and steam inhalation may be useful to keep secretions thin. Take OTC medications as needed for symptom relief. Tylenol or Ibuprofen as needed for pain/ fever. Use of intra-nasal corticosteroid, Flonase/Nasocort, may be helpful with congestion. Saline Rinse may be helpful in treating congestion by reducing inflammation and thinning mucus. Use of Mucolytics (guaifenesin) may used to loosen phlegm and bronchial secretions.   Discussed the use of decongestant like sudafed for drying out sinuses.  Encouraged daily vitamin C intake of [phone] mg, hand hygiene and adequate rest. Signs and symptoms reviewed with patient that warrants RTO.  Patient agreeable to plan. Discussed the abx and risks including nausea, rash and diarrhea among others        Gale Journey, MD

## 2017-08-21 NOTE — Patient Instructions (Signed)
Doxycycline delayed-release capsules  Brand Name: Oracea  What is this medicine?  DOXYCYCLINE (dox i SYE kleen) is a tetracycline antibiotic. It is used to treat certain kinds of bacterial infections, Lyme disease, and malaria. It will not work for colds, flu, or other viral infections.  How should I use this medicine?  Take this medicine by mouth with a full glass of water. Follow the directions on the prescription label. Do not crush or chew. The capsules may be opened and the pellets sprinkled on applesauce. Swallow the pellets whole without chewing. Follow with an 8 ounce glass of water to help you swallow all the pellets. Do not prepare a dose and store for later use. The applesauce mixture should be taken immediately after you prepare it. It is best to take this medicine without other food, but if it upsets your stomach take it with food. Take your medicine at regular intervals. Do not take your medicine more often than directed. Take all of your medicine as directed even if you think your are better. Do not skip doses or stop your medicine early.   Talk to your pediatrician regarding the use of this medicine in children. While this drug may be prescribed for selected conditions, precautions do apply.  What side effects may I notice from receiving this medicine?  Side effects that you should report to your doctor or health care professional as soon as possible:   allergic reactions like skin rash, itching or hives, swelling of the face, lips, or tongue   difficulty breathing   fever   itching in the rectal or genital area   pain on swallowing   redness, blistering, peeling or loosening of the skin, including inside the mouth   severe stomach pain or cramps   unusual bleeding or bruising   unusually weak or tired   yellowing of the eyes or skin  Side effects that usually do not require medical attention (report to your doctor or health care professional if they continue or are  bothersome):   diarrhea   loss of appetite   nausea, vomiting  What may interact with this medicine?   antacids   barbiturates   birth control pills   bismuth subsalicylate   carbamazepine   methoxyflurane   other antibiotics   phenytoin   vitamins that contain iron   warfarin  What if I miss a dose?  If you miss a dose, take it as soon as you can. If it is almost time for your next dose, take only that dose. Do not take double or extra doses.  Where should I keep my medicine?  Keep out of the reach of children.  Store at room temperature, below 25 degrees C (77 degrees F). Protect from light. Keep container tightly closed. Throw away any unused medicine after the expiration date. Taking this medicine after the expiration date can make you seriously ill.  What should I tell my health care provider before I take this medicine?  They need to know if you have any of these conditions:   bowel disease like colitis   liver disease   long exposure to sunlight like working outdoors   an unusual or allergic reaction to doxycycline, tetracycline antibiotics, other medicines, foods, dyes, or preservatives   pregnant or trying to get pregnant   breast-feeding  What should I watch for while using this medicine?  Tell your doctor or health care professional if your symptoms do not improve.  Do not treat   diarrhea with over the counter products. Contact your doctor if you have diarrhea that lasts more than 2 days or if it is severe and watery.  Do not take this medicine just before going to bed. It may not dissolve properly when you lay down and can cause pain in your throat. Drink plenty of fluids while taking this medicine to also help reduce irritation in your throat.  This medicine can make you more sensitive to the sun. Keep out of the sun. If you cannot avoid being in the sun, wear protective clothing and use sunscreen. Do not use sun lamps or tanning beds/booths.  If you are being treated for a sexually  transmitted infection, avoid sexual contact until you have finished your treatment. Your sexual partner may also need treatment.  Avoid antacids, aluminum, calcium, magnesium, and iron products for 4 hours before and 2 hours after taking a dose of this medicine.  Birth control pills may not work properly while you are taking this medicine. Talk to your doctor about using an extra method of birth control.  If you are using this medicine to prevent malaria, you should still protect yourself from contact with mosquitos. Stay in screened-in areas, use mosquito nets, keep your body covered, and use an insect repellent.  NOTE:This sheet is a summary. It may not cover all possible information. If you have questions about this medicine, talk to your doctor, pharmacist, or health care provider. Copyright 2018 [employer]

## 2017-08-28 ENCOUNTER — Ambulatory Visit (INDEPENDENT_AMBULATORY_CARE_PROVIDER_SITE_OTHER): Payer: Medicare Other | Admitting: Cardiovascular Disease

## 2017-08-31 ENCOUNTER — Other Ambulatory Visit (INDEPENDENT_AMBULATORY_CARE_PROVIDER_SITE_OTHER): Payer: Self-pay

## 2017-08-31 NOTE — Progress Notes (Signed)
F/U for Care Management    Called pt to F/U. Left vm w/ nurse contact info encouraging pt to return nurse call.    Pt is aware that she can call nurse.  Will continue to F/U w/ pt.  Basilia Jumbo, RN, BSN CCM  Nurse [employer]   T [phone]   Judie Petit [phone]

## 2017-09-01 ENCOUNTER — Other Ambulatory Visit (INDEPENDENT_AMBULATORY_CARE_PROVIDER_SITE_OTHER): Payer: Self-pay

## 2017-09-01 NOTE — Progress Notes (Signed)
F/U for Care Management    Pt called in and left a vm stating that she's doing well.    Returned pt's call and left a vm thanking her for returning NN's call.    Pt is aware that she can call nurse.  Will continue to F/U w/ pt.  Basilia Jumbo, RN, BSN CCM  Nurse [employer]   T [phone]   Judie Petit [phone]

## 2017-09-08 ENCOUNTER — Other Ambulatory Visit (INDEPENDENT_AMBULATORY_CARE_PROVIDER_SITE_OTHER): Payer: Self-pay

## 2017-09-08 NOTE — Progress Notes (Signed)
F/U for Care Management    Spoke w/ pt who stated that she's good.  She said that she feels like new money.  Denies any cough, SOB or H/A.  Finished all her ABX that Dr Boneta Lucks gave her.    Reminded pt that there is a on call doctor available when the office is closed.  Reviewed that the answering service will answer her call and doctor on call will call her back.    Pt very happy to hear fm NN.    Pt is aware that she can call nurse.  Will continue to F/U w/ pt.  Basilia Jumbo, RN, BSN CCM  Nurse [employer]   T [phone]   Judie Petit [phone]

## 2017-10-05 ENCOUNTER — Telehealth (INDEPENDENT_AMBULATORY_CARE_PROVIDER_SITE_OTHER): Payer: Self-pay | Admitting: Family

## 2017-10-05 NOTE — Telephone Encounter (Signed)
ON CALL NOTE: Late entry    Pt called to report that she has been having some new symptoms that started within the past 2 months no worsening symptoms or current symptoms but she just wanted to discuss.    She states her arms will sometimes jump right before she falls asleep at night. She has no other symptoms.    I encouraged her to schedule an appt to discuss in office.    Pt verbalized understanding.    Balinda Quails, FNP

## 2017-10-20 ENCOUNTER — Ambulatory Visit (INDEPENDENT_AMBULATORY_CARE_PROVIDER_SITE_OTHER): Payer: Medicare Other | Admitting: Cardiovascular Disease

## 2017-11-10 ENCOUNTER — Other Ambulatory Visit (INDEPENDENT_AMBULATORY_CARE_PROVIDER_SITE_OTHER): Payer: Self-pay

## 2017-11-10 ENCOUNTER — Telehealth (INDEPENDENT_AMBULATORY_CARE_PROVIDER_SITE_OTHER): Payer: Self-pay | Admitting: Internal Medicine

## 2017-11-10 DIAGNOSIS — Z111 Encounter for screening for respiratory tuberculosis: Secondary | ICD-10-CM

## 2017-11-10 NOTE — Telephone Encounter (Signed)
Order placed

## 2017-11-10 NOTE — Progress Notes (Signed)
F/U for Care Management    Pt called in and left a vm message saying that [phone] is her new cell number and that she's asking for an OV appt earlier than 4/1, so she can get a tb test placed, because she's starting a new job.    Called pt back and left a vm saying that  4/1 is the earliest appt and that there is a request fm her in her chart asking for an Rx for a tb test.      Pt is aware that she can call nurse.  Will continue to F/U w/ pt.  Basilia Jumbo, RN, BSN CCM  Nurse [employer]   T [phone]   Judie Petit [phone]

## 2017-11-10 NOTE — Telephone Encounter (Signed)
Pt called looking to have a tb screening which is needed for her new employment. Pt scheduled appt with nurse on 11/20/17. Can an order be placed for tb screening.    Pt ph. [phone]

## 2017-11-11 ENCOUNTER — Telehealth (INDEPENDENT_AMBULATORY_CARE_PROVIDER_SITE_OTHER): Payer: Self-pay | Admitting: Family Medicine

## 2017-11-11 NOTE — Telephone Encounter (Signed)
52 yo female pt who called with c/o constant awaking with severe SOB.  Denies similar symptoms in the past. Denies any hx of asthma or COPD  Pt was advised to go to ED or UC for evaluation if symptoms persist or worsening  Follow up with PCP as soon as possible.    Thank you.    Sincerely,          Cristle Jared Bornacelly,M.D.  Attending Physician   Family Medicine  [facility]  [address]  [place], [place] [address]  P: [phone]  F: [phone]  [email or web address]

## 2017-11-14 NOTE — Telephone Encounter (Signed)
Attempt #1 Unable to leave message on VM

## 2017-11-20 ENCOUNTER — Ambulatory Visit (INDEPENDENT_AMBULATORY_CARE_PROVIDER_SITE_OTHER): Payer: Medicare Other

## 2017-11-20 DIAGNOSIS — Z111 Encounter for screening for respiratory tuberculosis: Secondary | ICD-10-CM

## 2017-11-20 NOTE — Progress Notes (Signed)
PPD verified by Watt Geiler LPN

## 2017-11-20 NOTE — Progress Notes (Signed)
Vaccination administration:  Patient presented to the office for the following  administration:    PPD Placement    No reactions were noted and patient left in good condition.

## 2017-11-21 NOTE — Telephone Encounter (Signed)
Patient had testing in office

## 2017-11-22 ENCOUNTER — Encounter (INDEPENDENT_AMBULATORY_CARE_PROVIDER_SITE_OTHER): Payer: Self-pay

## 2017-11-22 ENCOUNTER — Ambulatory Visit (INDEPENDENT_AMBULATORY_CARE_PROVIDER_SITE_OTHER): Payer: Medicare Other

## 2017-11-22 DIAGNOSIS — Z111 Encounter for screening for respiratory tuberculosis: Secondary | ICD-10-CM

## 2017-12-14 ENCOUNTER — Encounter (INDEPENDENT_AMBULATORY_CARE_PROVIDER_SITE_OTHER): Payer: Self-pay

## 2018-01-17 ENCOUNTER — Ambulatory Visit (INDEPENDENT_AMBULATORY_CARE_PROVIDER_SITE_OTHER): Payer: Medicare Other | Admitting: Family

## 2018-01-17 ENCOUNTER — Encounter (INDEPENDENT_AMBULATORY_CARE_PROVIDER_SITE_OTHER): Payer: Self-pay | Admitting: Family

## 2018-01-17 VITALS — BP 126/67 | HR 93 | Temp 98.5°F | Ht 64.0 in | Wt 202.0 lb

## 2018-01-17 DIAGNOSIS — K219 Gastro-esophageal reflux disease without esophagitis: Secondary | ICD-10-CM

## 2018-01-17 DIAGNOSIS — Z09 Encounter for follow-up examination after completed treatment for conditions other than malignant neoplasm: Secondary | ICD-10-CM

## 2018-01-17 DIAGNOSIS — R7989 Other specified abnormal findings of blood chemistry: Secondary | ICD-10-CM

## 2018-01-17 DIAGNOSIS — I10 Essential (primary) hypertension: Secondary | ICD-10-CM

## 2018-01-17 DIAGNOSIS — R079 Chest pain, unspecified: Secondary | ICD-10-CM

## 2018-01-17 DIAGNOSIS — R791 Abnormal coagulation profile: Secondary | ICD-10-CM

## 2018-01-17 MED ORDER — LISINOPRIL 20 MG PO TABS
20.00 mg | ORAL_TABLET | Freq: Every day | ORAL | 1 refills | Status: DC
Start: 2018-01-17 — End: 2018-08-07

## 2018-01-17 MED ORDER — ATENOLOL 100 MG PO TABS
100.00 mg | ORAL_TABLET | Freq: Every day | ORAL | 1 refills | Status: DC
Start: 2018-01-17 — End: 2018-08-07

## 2018-01-17 NOTE — Progress Notes (Signed)
[facility] [place]    PROGRESS NOTE      Patient: Christina Torres   Date: 01/17/2018   MRN: [id number]     Past Medical History:   Diagnosis Date   . Anemia     PMH   . Aneurysm     2 aneurysm each side of nose. monitored closely   . Bipolar 1 disorder    . Eczema    . Fibroid tumor    . Gastroesophageal reflux disease    . Headache    . Hypertension    . OSA (obstructive sleep apnea) 01/2016     Social History     Social History   . Marital status: Single     Spouse name: N/A   . Number of children: N/A   . Years of education: N/A     Occupational History   . Not on file.     Social History Main Topics   . Smoking status: Never Smoker   . Smokeless tobacco: Never Used   . Alcohol use No   . Drug use: No   . Sexual activity: Not Currently     Partners: Male     Other Topics Concern   . Not on file     Social History Narrative   . No narrative on file     Family History   Problem Relation Age of Onset   . Cancer Mother         colon   . Hypertension Mother    . Hypertension Father    . Diabetes Father    . Heart failure Father    . Breast cancer Neg Hx            ASSESSMENT/PLAN     Christina Torres is a 52 y.o. female    Chief Complaint   Patient presents with   . Follow-up     Pt went to ER 5/27 for chest pain. BP was 180/88. Testing was negative. Gave pt fluids and she was discharged. Pt went again on 5/28 for chest pain and headaches. BP at the time was 168/88. Pt was diagnosed w/ GERD. She was discharged this morning.         1. Gastroesophageal reflux disease, esophagitis presence not specified  - Ambulatory referral to Gastroenterology    - GERD tips printed on AVS and reviewed today  - start Zantac as recommended and f/u with GI as recommended    2. Chest pain, unspecified type  - Ambulatory referral to Gastroenterology    likely GERD  If persist f/u for re-evaluation  - would consider referral to cardiologist if persist but pt in agreement to hol don referral     3. Positive D dimer    CT scan  negative    4. Encounter for examination following treatment at hospital    5. Essential hypertension  - atenolol (TENORMIN) 100 MG tablet; Take 1 tablet (100 mg total) by mouth daily  Dispense: 90 tablet; Refill: 1  - lisinopril (PRINIVIL,ZESTRIL) 20 MG tablet; Take 1 tablet (20 mg total) by mouth daily  Dispense: 90 tablet; Refill: 1    Chronic, stable; continue current meds  F/u 6 months           Risk & Benefits of any new medication(s) if started at this visit, were explained to the patient (and family) who verbalized understanding & they are in agreement to the treatment plan.     Patient (  family) encouraged to contact me/clinical staff with any questions/concerns    MEDICATIONS     Current Outpatient Prescriptions   Medication Sig Dispense Refill   . asenapine Maleate (SAPHRIS) 10 MG SL Tab Place 10 mg under the tongue nightly.     [name] atenolol (TENORMIN) 100 MG tablet Take 1 tablet (100 mg total) by mouth daily 90 tablet 1   . IRON PO Take by mouth.     [name] lisinopril (PRINIVIL,ZESTRIL) 20 MG tablet Take 1 tablet (20 mg total) by mouth daily 90 tablet 1   . OXcarbazepine (TRILEPTAL) 600 MG tablet Take 600 mg by mouth 2 (two) times daily.Once in morning and once in evening         . QUEtiapine (SEROQUEL) 400 MG tablet Take 800 mg by mouth nightly.       . traZODone (DESYREL) 100 MG tablet Take 150 mg by mouth as needed.         . benztropine (COGENTIN) 1 MG tablet Take 1 mg by mouth as needed.         . cyclobenzaprine (FLEXERIL) 5 MG tablet TAKE 1-2 TABLET TWICE A DAY AS NEEDED FOR MUSCLE SPASMS  0     No current facility-administered medications for this visit.        Allergies   Allergen Reactions   . Hctz [Hydrochlorothiazide] Other (See Comments)     Hyponatremia     . Penicillins Angioedema   . Clindamycin Rash   . Lamictal [Lamotrigine] Rash       SUBJECTIVE     Chief Complaint   Patient presents with   . Follow-up     Pt went to ER 5/27 for chest pain. BP was 180/88. Testing was negative. Gave pt fluids  and she was discharged. Pt went again on 5/28 for chest pain and headaches. BP at the time was 168/88. Pt was diagnosed w/ GERD. She was discharged this morning.         Gastroesophageal Reflux   She complains of belching, chest pain (has resolved) and heartburn. She reports no abdominal pain, no choking, no coughing, no dysphagia, no hoarse voice, no nausea or no wheezing. This is a new problem. The current episode started 1 to 4 weeks ago. The problem occurs occasionally. The problem has been gradually improving (since GI cocktail in the ER). The heartburn is located in the left chest. The heartburn is of mild intensity. The symptoms are aggravated by lying down and certain foods. Pertinent negatives include no fatigue. Risk factors include obesity. Treatments tried: started on Zantac today.     Pt did follow with a cardiologist last year due to chest pain and stress test and was normal per pt and told GERD likely but resolve on its own until now.    ROS     Review of Systems   Constitutional: Negative for activity change, appetite change, chills, fatigue and fever.   HENT: Negative for hoarse voice.    Eyes: Negative for visual disturbance.   Respiratory: Negative for cough, choking, chest tightness, shortness of breath and wheezing.    Cardiovascular: Positive for chest pain (has resolved). Negative for palpitations and leg swelling.   Gastrointestinal: Positive for heartburn. Negative for abdominal pain, blood in stool, dysphagia, nausea and vomiting.   Musculoskeletal: Negative for back pain.   Skin: Negative for color change, pallor, rash and wound.   Neurological: Negative for dizziness, tremors, syncope, weakness, light-headedness, numbness and headaches.   Psychiatric/Behavioral: The patient  is not nervous/anxious.            The following portions of the patient's history were reviewed and updated as appropriate: Allergies, Current Medications, Past Family History, Past Medical history, Past social  history, Past surgical history, and Problem List.      PHYSICAL EXAM       Vitals:    01/17/18 0922   BP: 126/67   BP Site: Left arm   Pulse: 93   Temp: 98.5 F (36.9 C)   TempSrc: Oral   SpO2: 96%   Weight: 91.6 kg (202 lb)   Height: 1.626 m (5\' 4" )       Physical Exam   Nursing note and vitals reviewed.  Constitutional: She is oriented to person, place, and time. She appears well-developed and well-nourished. No distress.   HENT:   Head: Normocephalic and atraumatic.   Mouth/Throat: Oropharynx is clear and moist. No oropharyngeal exudate.   Eyes: Conjunctivae are normal.   Cardiovascular: Normal rate, regular rhythm, S1 normal, S2 normal, normal heart sounds and normal pulses.  Exam reveals no gallop and no friction rub.    No murmur heard.  Pulmonary/Chest: Effort normal and breath sounds normal. No respiratory distress. She has no wheezes. She has no rales. She exhibits no tenderness.   Abdominal: Soft. Bowel sounds are normal. She exhibits no distension and no mass. There is no hepatosplenomegaly. There is no tenderness. There is no rebound, no guarding and no CVA tenderness.   Musculoskeletal: Normal range of motion.   Neurological: She is alert and oriented to person, place, and time.   Skin: Skin is warm and dry. No rash noted. She is not diaphoretic.   Psychiatric: She has a normal mood and affect. Her behavior is normal. Judgment and thought content normal.           Results for orders placed or performed in visit on 04/18/17   POCT UA Clinitek AX (urine dipstick)   Result Value Ref Range    Color UA POCT Yellow     Clarity UA POCT Cloudy     Glucose, UA POCT Negative Negative    Bilirubin, UA POCT Negative Negative    Ketones, UA POCT Negative Negative mg/dL    Specific Gravity, UA POCT >=1.030 1.001 - 1.035 mg/dL    Blood, UA POCT  Moderate (A) Negative    PH, UA POCT 6.0 4.6 - 8    Protein, UA POCT Positive (A) Negative mg/dL    Urobilinogen, UA POCT 0.2 0.2, 1.0 mg/dL    Nitrite, UA POCT Negative  Negative    Leukocytes, UA POCT Small (A) Negative       Signed,  Balinda Quails, FNP-c  01/17/2018

## 2018-01-17 NOTE — Patient Instructions (Signed)
GERD (Adult)    The esophagus is a tube that carries food from the mouth to the stomach. A valve (the LES, lower esophageal sphincter) at the lower end of the esophagus prevents stomach acid from flowing upward. When this valve doesn't work properly, stomach contents may repeatedly flow back up (reflux) into the esophagus. This is calledgastroesophageal reflux disease (GERD).GERD can irritate the esophagus. It can cause problems with pain, swallowing or breathing. In severe cases, GERD can cause recurrent pneumonia (from aspiration or breathing in particles) or other serious problems.  Symptoms of reflux include burning, pressure or sharp pain in the upper abdomen or mid to lower chest. The pain can spread to the neck, back, or shoulder. There may be belching, an acid taste in the back of the throat, chronic cough, or sore throat, or hoarseness. GERD symptoms often occur during the day after a big meal. They can also occur at night when lying down.  Home care  Lifestyle changes can help reduce symptoms. If needed, your healthcare provider may prescribe medicines.Symptoms often improve with treatment, but if treatment is stopped, the symptoms often return after a few months. So most persons with GERD will need to continue treatment or get treatment on and off.  Lifestyle changes   Limit or avoid fatty, fried, and spicy foods, as well as coffee, chocolate, mint, and foods with high acid content such as tomatoes and citrus fruit and juices (orange, grapefruit, lemon).   Don't eat large meals, especially at night. Frequent, smaller meals are best. Don't lie down right after eating. And don't eat anything 3 hours before going to bed.   Don't drink alcohol or smoke. As much as possible, stay away from second hand smoke.   If you are overweight, losing weight will reduce symptoms.   Don't wear tight clothing around your stomach area.   If your symptoms occur during sleep, use a foam wedge to elevate your upper  body (not just your head.) Or, place 4" blocks under the head of your bed. Or use 2 bed risers under your bedframe.  Medicines  If needed, medicines can help relieve the symptoms of GERD and prevent damage to the esophagus. Discuss a medicine plan with your healthcare provider. This may include one or more of the following medicines:   Antacids to help neutralize the normal acids in your stomach.   Acid blockers (Histamine or H2 blockers) to decrease acid production.   Acid inhibitors (proton pump inhibitors PPIs) to decrease acid production in a different way than the blockers. They may work better, but can take a little longer to take effect.  Take an antacid 30 to 60 minutes after eating and at bedtime, but not at the same time as an acid blocker.  Try not to take medicines such as ibuprofen and aspirin. If you are taking aspirin for your heart or other medical reasons, talk to your healthcare provider about stopping it.  Follow-up care  Follow up with your healthcare provider or as advised by our staff.  When to seek medical advice  Call your healthcare provider if any of the following occur:   Stomach pain gets worse or moves to the lower right abdomen (appendix area)   Chest pain appears or gets worse, or spreads to the back, neck, shoulder, or arm   An over-the-counter trial of medicine doesn't relieve your symptoms   Weight loss that can't be explained   Trouble or pain swallowing   Frequent vomiting (can't keep   down liquids)   Blood in the stool or vomit (red or black in color)   Feeling weak or dizzy   Fever of 100.4F (38C) or higher, or as directed by your healthcare provider  Date Last Reviewed: 10/20/2016   2000-2018 [employer] [address], [place] [address]. All rights reserved. This information is not intended as a substitute for professional medical care. Always follow your healthcare professional's instructions.        Tips to Control Acid Reflux    To control  acid reflux, you'll need to make some basic diet and lifestyle changes. The simple steps outlined below may be all you'll need to ease discomfort.  Watch what you eat   Avoid fatty foods and spicy foods.   Eat fewer acidic foods, such as citrus and tomato-based foods. These can increase symptoms.   Limit drinking alcohol, caffeine, and fizzy beverages. All increase acid reflux.   Try limiting chocolate, peppermint, and spearmint.These can worsen acid reflux in some people.  Watch when you eat   Avoid lying down for3hours after eating.   Do not snack before going to bed.  Raise your head  Raising your head and upper body by 4 to 6 incheshelps limit reflux when you're lying down. Put blocks under the head of your bed frame to raise it.  Other changes   Lose weight, if you need to   Don't exercise near bedtime   Avoid tight-fitting clothes   Limit aspirin and ibuprofen   Stop smoking   Date Last Reviewed: 02/20/2015   2000-2018 [employer] [address], [place] [address]. All rights reserved. This information is not intended as a substitute for professional medical care. Always follow your healthcare professional's instructions.

## 2018-01-17 NOTE — Progress Notes (Signed)
Have you seen any specialists/other providers since your last visit with [place]?    Yes Orthopedic for sprained ankle    Arm preference verified?   Yes    The patient is due for Shingles, [facility] care plan letter

## 2018-01-30 ENCOUNTER — Telehealth (INDEPENDENT_AMBULATORY_CARE_PROVIDER_SITE_OTHER): Payer: Self-pay | Admitting: Family Medicine

## 2018-01-30 NOTE — Telephone Encounter (Addendum)
the patient states she has a headache   She ate some yogurt   She feels her BP is high:   She did not  take her BP she does not  have a BP cuff/monitor  The headache did  not wake the patient  up from sleep:   There is  No numbness/weakness of any extemity, no nausea nor vomiting nor change in vision;    She wants to know what she can do to lower her BP and go to sleep   No red flag symptosm present  D/w the patient if she does nto take her BP it is difficult to say if the BPcould be causing her headache   No alarm symptoms: recommend 2 tylenol now   BP Readings from Last 3 Encounters:   01/17/18 126/67   08/21/17 155/79   08/04/17 135/61     F/u PMD

## 2018-06-01 ENCOUNTER — Encounter: Payer: Self-pay | Admitting: Diagnostic Radiology

## 2018-07-11 ENCOUNTER — Encounter (INDEPENDENT_AMBULATORY_CARE_PROVIDER_SITE_OTHER): Payer: Self-pay

## 2018-07-30 ENCOUNTER — Inpatient Hospital Stay (INDEPENDENT_AMBULATORY_CARE_PROVIDER_SITE_OTHER): Payer: PRIVATE HEALTH INSURANCE | Admitting: Family Medicine

## 2018-08-07 ENCOUNTER — Ambulatory Visit (INDEPENDENT_AMBULATORY_CARE_PROVIDER_SITE_OTHER): Payer: PRIVATE HEALTH INSURANCE | Admitting: Internal Medicine

## 2018-08-07 ENCOUNTER — Encounter (INDEPENDENT_AMBULATORY_CARE_PROVIDER_SITE_OTHER): Payer: Self-pay | Admitting: Internal Medicine

## 2018-08-07 VITALS — BP 146/81 | HR 78 | Temp 99.0°F | Ht 64.0 in | Wt 205.0 lb

## 2018-08-07 DIAGNOSIS — Z23 Encounter for immunization: Secondary | ICD-10-CM

## 2018-08-07 DIAGNOSIS — I10 Essential (primary) hypertension: Secondary | ICD-10-CM

## 2018-08-07 DIAGNOSIS — R109 Unspecified abdominal pain: Secondary | ICD-10-CM

## 2018-08-07 MED ORDER — LISINOPRIL 20 MG PO TABS
20.0000 mg | ORAL_TABLET | Freq: Every day | ORAL | 1 refills | Status: DC
Start: 2018-08-07 — End: 2019-02-05

## 2018-08-07 MED ORDER — ATENOLOL 100 MG PO TABS
100.0000 mg | ORAL_TABLET | Freq: Every day | ORAL | 1 refills | Status: DC
Start: 2018-08-07 — End: 2019-02-05

## 2018-08-07 NOTE — Progress Notes (Signed)
Have you seen any specialists/other providers since your last visit with us?    No    Arm preference verified?   Yes    The patient is due for mammogram, shingles vaccine and [facility] care plan letter

## 2018-08-07 NOTE — Progress Notes (Signed)
Subjective:      Patient ID: Christina Torres is a 52 y.o. female  Chief Complaint   Patient presents with   . left side pain     Started approx. 1 month ago. Went to the ER 4 weeks ago and dx with flank pain. Then there was also a period of time when she didn't have insurnace and went to [facility] and was given motrin 800mg . Ct scan and full work up was normal. Pain is persisting intermittently. Doesn't have pain today but had it two days ago. Pt took the motrin this am and it helps.          Flank Pain   This is a new problem. The current episode started more than 1 month ago. The problem occurs intermittently. The problem has been waxing and waning since onset. The pain is present in the lumbar spine. The quality of the pain is described as aching. Radiates to: left abdomen. The pain is moderate. Pertinent negatives include no abdominal pain, dysuria, fever, numbness, paresthesias, tingling or weakness.      didn't have insurance for a while and was evaluated and gave ibuprofen then went to ER a few weeks ago and had work up and was normal, was told it was flank pain and to take ibuprofen which helps.   Not hurting today, but was hurting yesterday, wasn't as bad.     The following portions of the patient's history were reviewed and updated as appropriate: current medications, allergies, past family history, past medical history, past social history, past surgical history and problem list.     Review of Systems   Constitutional: Negative for fever.   Gastrointestinal: Negative for abdominal pain, nausea and vomiting.   Genitourinary: Positive for flank pain. Negative for dysuria, hematuria and urgency.   Neurological: Negative for tingling, weakness, numbness and paresthesias.         BP 146/81   Pulse 78   Temp 99 F (37.2 C) (Oral)   Ht 1.626 m (5\' 4" )   Wt 93 kg (205 lb)   SpO2 97%   BMI 35.19 kg/m    Objective:   Physical Exam   Constitutional: She is oriented to person, place, and time.  She appears well-developed and well-nourished. No distress.   Eyes: Conjunctivae are normal.   Cardiovascular: Normal rate, regular rhythm and normal heart sounds.   Pulmonary/Chest: Effort normal and breath sounds normal.   Musculoskeletal:      Thoracic back: She exhibits pain and spasm. She exhibits normal range of motion, no tenderness, no bony tenderness, no swelling and no edema.        Back:    Neurological: She is alert and oriented to person, place, and time.   Psychiatric: She has a normal mood and affect.   Nursing note and vitals reviewed.      Assessment:     1. Left flank pain     2. Essential hypertension  atenolol (TENORMIN) 100 MG tablet    lisinopril (PRINIVIL,ZESTRIL) 20 MG tablet   3. Need for vaccination  Flu vacc quad recombinant pres free 18 yrs& up         Plan:     52 y.o. female presents with muscle spasm causing flank/back pain. Reviewed work up in ER that included labs, UA/culture, CT abdomen and all WNL and without suggestion of cause of pain. Pain overall improving and most consistent with MSK etiology.   -Recommend heat with heat  pack or hot baths/showers.  -Stretching exercises and massage   -NSAIDs prn. (limit use)  -counseled on potential triggers and lifestyle changes   -Recommend to follow up if no improvement in a few weeks and would consider physical therapy referral.  -counseled on signs/symptoms to warrant re-evaluation   -BP a little elevated today. Continue current meds, check home readings and follow up if persistently elevated.     Return in about 4 weeks (around 09/04/2018) for follow up current symptoms as needed.     Gust Brooms, MD

## 2018-08-07 NOTE — Patient Instructions (Signed)
Flank Pain, Uncertain Cause  The flank is the area between your upper abdomen and your back. Pain there is often caused by a problem with your kidneys. It might be a kidney infection or a kidney stone. Other causes of flank pain include spinal arthritis, a pinched nerve from a back injury, or a back muscle strain or spasm.  The cause of your flank pain is not certain. You may need other tests.  Home care  Follow these tips when caring for yourself at home:   You may use acetaminophen or ibuprofen to control pain, unless your health care provider prescribed another medicine. If you have chronic liver or kidney disease, talk with your provider before taking these medicines. Also talk with your provider first if you've ever had a stomach ulcer or GI bleeding.   If the pain is coming from your muscles, you may get relief with ice or heat. During the first 2 days after the injury, put an ice pack on the painful area for 20 minutes every 2 to 4 hours. This will reduce swelling and pain. A hot shower, hot bath, or heating pad works well for a muscle spasm. You can start with ice, then switch to heat after 2 days. You might find that alternating ice and heat works well. Use the method that feels the best to you.  Follow-up care  Follow up with your healthcare provider if your symptoms don't get better over the next few days.  When to seek medical advice  Call your healthcare provider right awayif any of these happen:   Repeated vomiting   Fever of 100.4F (38C) or higher, or as directed by your health care provider   Flank pain that gets worse   Pain that spreads to the front of your belly (abdomen)   Dizziness, weakness, or fainting   Blood in your urine   Burning feeling when you urinate or the need to urinate often   Pain in one of your legs that gets worse   Numbness or weakness in a leg  [employer] last reviewed this educational content on 05/23/2015   2000-2019 [employer] [address], [place] [address]. All rights reserved. This information is not intended as a substitute for professional medical care. Always follow your healthcare professional's instructions.

## 2018-08-08 ENCOUNTER — Encounter (INDEPENDENT_AMBULATORY_CARE_PROVIDER_SITE_OTHER): Payer: Self-pay

## 2018-08-08 ENCOUNTER — Inpatient Hospital Stay (INDEPENDENT_AMBULATORY_CARE_PROVIDER_SITE_OTHER): Payer: PRIVATE HEALTH INSURANCE | Admitting: Internal Medicine

## 2018-08-29 ENCOUNTER — Encounter (INDEPENDENT_AMBULATORY_CARE_PROVIDER_SITE_OTHER): Payer: Self-pay

## 2018-10-02 ENCOUNTER — Ambulatory Visit (INDEPENDENT_AMBULATORY_CARE_PROVIDER_SITE_OTHER): Payer: PRIVATE HEALTH INSURANCE | Admitting: Internal Medicine

## 2018-11-12 ENCOUNTER — Ambulatory Visit (INDEPENDENT_AMBULATORY_CARE_PROVIDER_SITE_OTHER): Payer: Self-pay | Admitting: Internal Medicine

## 2018-11-13 ENCOUNTER — Ambulatory Visit (INDEPENDENT_AMBULATORY_CARE_PROVIDER_SITE_OTHER): Payer: PRIVATE HEALTH INSURANCE | Admitting: Internal Medicine

## 2018-12-17 ENCOUNTER — Telehealth (INDEPENDENT_AMBULATORY_CARE_PROVIDER_SITE_OTHER): Payer: Self-pay | Admitting: Internal Medicine

## 2018-12-17 DIAGNOSIS — Z111 Encounter for screening for respiratory tuberculosis: Secondary | ICD-10-CM

## 2018-12-17 NOTE — Telephone Encounter (Signed)
Pt is requested for Dr Otilio Jefferson to place an order in the system for a TB test she needs to complete for work.     Please have Dr Otilio Jefferson to place the order and give the pt a call once order has been placed.     Pt can be reached at: [phone]

## 2018-12-17 NOTE — Telephone Encounter (Signed)
Can you please place order

## 2018-12-17 NOTE — Telephone Encounter (Signed)
Order placed

## 2018-12-19 ENCOUNTER — Encounter (INDEPENDENT_AMBULATORY_CARE_PROVIDER_SITE_OTHER): Payer: Self-pay

## 2018-12-19 NOTE — Telephone Encounter (Signed)
Cannot reach pt via phone-unable to leave a voicemail.  MyChart message sent

## 2018-12-26 ENCOUNTER — Ambulatory Visit (INDEPENDENT_AMBULATORY_CARE_PROVIDER_SITE_OTHER): Payer: PRIVATE HEALTH INSURANCE

## 2018-12-26 ENCOUNTER — Other Ambulatory Visit (FREE_STANDING_LABORATORY_FACILITY): Payer: PRIVATE HEALTH INSURANCE

## 2018-12-26 ENCOUNTER — Other Ambulatory Visit (INDEPENDENT_AMBULATORY_CARE_PROVIDER_SITE_OTHER): Payer: Self-pay | Admitting: Internal Medicine

## 2018-12-26 DIAGNOSIS — Z111 Encounter for screening for respiratory tuberculosis: Secondary | ICD-10-CM

## 2018-12-26 NOTE — Progress Notes (Signed)
quantiferon gold ordered per request.

## 2018-12-26 NOTE — Addendum Note (Signed)
Addended by: Mariann Laster on: 12/26/2018 10:23 AM     Modules accepted: Orders

## 2018-12-28 LAB — QUANTIFERON(R)-TB GOLD PLUS
Mitogen-NIL: 6.6
NIL: 0.01
Quantiferon TB Gold Plus: NEGATIVE
TB1-NIL: 0
TB2-NIL: 0

## 2018-12-31 ENCOUNTER — Telehealth (INDEPENDENT_AMBULATORY_CARE_PROVIDER_SITE_OTHER): Payer: Self-pay | Admitting: Internal Medicine

## 2018-12-31 NOTE — Telephone Encounter (Signed)
Patient called regarding recent results for TB test  Please return call at noted phone number below.      Patient Preferred Callback Number: [phone]

## 2018-12-31 NOTE — Telephone Encounter (Signed)
The test is negative.

## 2018-12-31 NOTE — Telephone Encounter (Signed)
Please advise on test results. Thank you

## 2019-01-01 ENCOUNTER — Ambulatory Visit (INDEPENDENT_AMBULATORY_CARE_PROVIDER_SITE_OTHER): Payer: PRIVATE HEALTH INSURANCE | Admitting: Family Nurse Practitioner

## 2019-01-01 ENCOUNTER — Encounter (INDEPENDENT_AMBULATORY_CARE_PROVIDER_SITE_OTHER): Payer: Self-pay | Admitting: Family Nurse Practitioner

## 2019-01-01 ENCOUNTER — Ambulatory Visit (INDEPENDENT_AMBULATORY_CARE_PROVIDER_SITE_OTHER): Payer: PRIVATE HEALTH INSURANCE | Admitting: Family

## 2019-01-01 VITALS — BP 132/69 | HR 71 | Temp 98.7°F | Ht 63.78 in | Wt 200.0 lb

## 2019-01-01 DIAGNOSIS — N6324 Unspecified lump in the left breast, lower inner quadrant: Secondary | ICD-10-CM

## 2019-01-01 NOTE — Telephone Encounter (Signed)
Lm on hippa number advising of results

## 2019-01-01 NOTE — Progress Notes (Signed)
Subjective:      Patient ID: Christina Torres is a 53 y.o. female     Chief Complaint   Patient presents with    Lump left breast     Noticed this morning,no, other complaints, feels pain on the same side for many months        Established patient, new to this provider  Chart reviewed    Noticed a lump in her left breast this morning. Lump is tender. Has PMH fibrocystic, dense breasts. Nothing makes better or worse. Denies skin changes or nipple discharge. Has increased caffeine consumption in last week.       The following sections were reviewed this encounter by the provider:   Tobacco   Allergies   Meds   Problems   Med Hx   Surg Hx   Fam Hx          Review of Systems   Constitutional: Negative for appetite change, chills, fatigue and fever.   Respiratory: Negative for cough and shortness of breath.    Cardiovascular: Negative for chest pain.   Gastrointestinal: Negative for diarrhea, nausea and vomiting.   Musculoskeletal: Negative for myalgias.   Skin: Negative for color change, rash and wound.   Neurological: Negative for dizziness, light-headedness and headaches.   Hematological: Negative for adenopathy.          BP 132/69    Pulse 71    Temp 98.7 F (37.1 C)    Ht 1.62 m (5' 3.78")    Wt 90.7 kg (200 lb)    SpO2 97%    BMI 34.57 kg/m     Objective:     Physical Exam  Vitals signs reviewed.   Constitutional:       General: She is not in acute distress.     Appearance: Normal appearance. She is well-groomed.   HENT:      Head: Normocephalic and atraumatic.   Eyes:      Conjunctiva/sclera: Conjunctivae normal.   Cardiovascular:      Rate and Rhythm: Normal rate and regular rhythm.      Heart sounds: Normal heart sounds, S1 normal and S2 normal.   Pulmonary:      Effort: Pulmonary effort is normal.      Breath sounds: Normal breath sounds and air entry.   Chest:      Breasts: Breasts are symmetrical.         Right: No swelling, inverted nipple, mass, nipple discharge, skin change or tenderness.         Left:  Mass (shape poorly defined, nonmobile, tender on palpation) and tenderness present. No swelling, inverted nipple, nipple discharge or skin change.       Lymphadenopathy:      Upper Body:      Right upper body: No supraclavicular or axillary adenopathy.      Left upper body: No supraclavicular or axillary adenopathy.   Skin:     General: Skin is warm and dry.      Capillary Refill: Capillary refill takes less than 2 seconds.   Neurological:      Mental Status: She is alert.   Psychiatric:         Mood and Affect: Mood normal.         Behavior: Behavior normal. Behavior is cooperative.          Assessment/Plan     1. Breast lump on left side at 7 o'clock position  New  - [employer]  Diagnostic Bilateral W Cad; Future   Recommend diagnostic mammo, [place] if needed, will follow up with results  Discussed s/sx that warrant immediate follow up         Leticia Clas, FNP

## 2019-01-01 NOTE — Progress Notes (Signed)
Have you seen any specialists/other providers since your last visit with [place]?    No    Arm preference verified?   Yes    The patient is due for mammogram, shingrix, [facility] care plan letter,

## 2019-01-02 ENCOUNTER — Ambulatory Visit (INDEPENDENT_AMBULATORY_CARE_PROVIDER_SITE_OTHER): Payer: PRIVATE HEALTH INSURANCE | Admitting: Family Nurse Practitioner

## 2019-01-24 ENCOUNTER — Encounter (INDEPENDENT_AMBULATORY_CARE_PROVIDER_SITE_OTHER): Payer: Self-pay | Admitting: Family Nurse Practitioner

## 2019-02-05 ENCOUNTER — Other Ambulatory Visit (INDEPENDENT_AMBULATORY_CARE_PROVIDER_SITE_OTHER): Payer: Self-pay | Admitting: Internal Medicine

## 2019-02-05 ENCOUNTER — Other Ambulatory Visit: Payer: Self-pay | Admitting: Internal Medicine

## 2019-02-05 DIAGNOSIS — I10 Essential (primary) hypertension: Secondary | ICD-10-CM

## 2019-02-05 MED ORDER — ATENOLOL 100 MG PO TABS
100.0000 mg | ORAL_TABLET | Freq: Every day | ORAL | 0 refills | Status: DC
Start: 2019-02-05 — End: 2019-02-05

## 2019-02-05 MED ORDER — LISINOPRIL 20 MG PO TABS
20.0000 mg | ORAL_TABLET | Freq: Every day | ORAL | 1 refills | Status: DC
Start: 2019-02-05 — End: 2019-07-16

## 2019-02-05 NOTE — Telephone Encounter (Signed)
Last filled December 2019.   Last o/v May 2020.  Pt has an upcoming appointment on February 18 2019 with Dr. Otilio Jefferson.  Queued up 90 day supply with 1 refills.

## 2019-02-05 NOTE — Telephone Encounter (Signed)
Name, strength, directions of requested refill(s):  lisinopril (PRINIVIL,ZESTRIL) 20 MG tablet  atenolol (TENORMIN) 100 MG tablet  Pharmacy to send refill to or patient to pick up rx from office (mark requested pharmacy in BOLD):      [facility] [phone] [facility] [place], [place] - [address]  [address] [place] [address]  Phone: [phone] Fax: [phone]        Please mark "X" next to the preferred call back number:    Mobile:   Telephone Information:   Mobile [phone]   Mobile [phone]       Home: @HOMEPHONE @    Work: @WORKPHONE @          Next visit: Visit date not found

## 2019-02-05 NOTE — Telephone Encounter (Signed)
Refill sent.

## 2019-02-05 NOTE — Telephone Encounter (Signed)
Patient is requesting a temporary refill for the following medication. Next office visit is on 02/18/19.    Name, strength, directions of requested refill(s):    lisinopril (PRINIVIL,ZESTRIL) 20 MG tablet    Pharmacy to send refill to or patient to pick up rx from office (mark requested pharmacy in BOLD):      [facility] [phone], [place] - [address]  [address] [place] [address]  Phone: [phone] Fax: [phone]        Please mark "X" next to the preferred call back number:    Mobile:   Telephone Information:   Mobile [phone]   Mobile [phone]       Home: @HOMEPHONE @    Work: @WORKPHONE @          Next visit: Visit date not found   .

## 2019-02-05 NOTE — Telephone Encounter (Signed)
LMTCB  Pt due for follow up for HTN

## 2019-02-05 NOTE — Telephone Encounter (Signed)
Last filled December 2019.   Last o/v December 2019. More recent visits acute.   Pt has an upcoming appointment on February 18 2019 with Dr. Otilio Jefferson.  Queued up 30 day supply with 0 refills.

## 2019-02-07 ENCOUNTER — Encounter (INDEPENDENT_AMBULATORY_CARE_PROVIDER_SITE_OTHER): Payer: Self-pay | Admitting: Family Nurse Practitioner

## 2019-02-07 ENCOUNTER — Other Ambulatory Visit (INDEPENDENT_AMBULATORY_CARE_PROVIDER_SITE_OTHER): Payer: Self-pay

## 2019-02-07 DIAGNOSIS — N6324 Unspecified lump in the left breast, lower inner quadrant: Secondary | ICD-10-CM

## 2019-02-15 ENCOUNTER — Telehealth (INDEPENDENT_AMBULATORY_CARE_PROVIDER_SITE_OTHER): Payer: Self-pay

## 2019-02-15 NOTE — Telephone Encounter (Addendum)
Lmtcb, needs ps

## 2019-02-18 ENCOUNTER — Encounter (INDEPENDENT_AMBULATORY_CARE_PROVIDER_SITE_OTHER): Payer: Self-pay | Admitting: Internal Medicine

## 2019-02-18 ENCOUNTER — Ambulatory Visit (INDEPENDENT_AMBULATORY_CARE_PROVIDER_SITE_OTHER): Payer: PRIVATE HEALTH INSURANCE | Admitting: Internal Medicine

## 2019-02-18 VITALS — BP 139/87 | HR 72 | Temp 97.5°F | Wt 204.0 lb

## 2019-02-18 DIAGNOSIS — F319 Bipolar disorder, unspecified: Secondary | ICD-10-CM

## 2019-02-18 DIAGNOSIS — G4733 Obstructive sleep apnea (adult) (pediatric): Secondary | ICD-10-CM

## 2019-02-18 DIAGNOSIS — I10 Essential (primary) hypertension: Secondary | ICD-10-CM

## 2019-02-18 LAB — COMPREHENSIVE METABOLIC PANEL
ALT: 15 U/L (ref 0–55)
AST (SGOT): 15 U/L (ref 5–34)
Albumin/Globulin Ratio: 1 (ref 0.9–2.2)
Albumin: 3.5 g/dL (ref 3.5–5.0)
Alkaline Phosphatase: 94 U/L (ref 37–106)
Anion Gap: 6 (ref 5.0–15.0)
BUN: 11 mg/dL (ref 7.0–19.0)
Bilirubin, Total: 0.1 mg/dL — ABNORMAL LOW (ref 0.2–1.2)
CO2: 25 mEq/L (ref 21–29)
Calcium: 8.7 mg/dL (ref 8.5–10.5)
Chloride: 106 mEq/L (ref 100–111)
Creatinine: 0.8 mg/dL (ref 0.4–1.5)
Globulin: 3.5 g/dL (ref 2.0–3.7)
Glucose: 80 mg/dL (ref 70–100)
Potassium: 4.3 mEq/L (ref 3.5–5.1)
Protein, Total: 7 g/dL (ref 6.0–8.3)
Sodium: 137 mEq/L (ref 136–145)

## 2019-02-18 LAB — LIPID PANEL
Cholesterol / HDL Ratio: 3.7
Cholesterol: 183 mg/dL (ref 0–199)
HDL: 50 mg/dL (ref 40–9999)
LDL Calculated: 94 mg/dL (ref 0–99)
Triglycerides: 196 mg/dL — ABNORMAL HIGH (ref 34–149)
VLDL Calculated: 39 mg/dL (ref 10–40)

## 2019-02-18 LAB — CBC
Absolute NRBC: 0 10*3/uL (ref 0.00–0.00)
Hematocrit: 37.8 % (ref 34.7–43.7)
Hgb: 11.7 g/dL (ref 11.4–14.8)
MCH: 29.8 pg (ref 25.1–33.5)
MCHC: 31 g/dL — ABNORMAL LOW (ref 31.5–35.8)
MCV: 96.2 fL — ABNORMAL HIGH (ref 78.0–96.0)
MPV: 11.5 fL (ref 8.9–12.5)
Nucleated RBC: 0 /100 WBC (ref 0.0–0.0)
Platelets: 204 10*3/uL (ref 142–346)
RBC: 3.93 10*6/uL (ref 3.90–5.10)
RDW: 13 % (ref 11–15)
WBC: 8.25 10*3/uL (ref 3.10–9.50)

## 2019-02-18 LAB — HEMOLYSIS INDEX: Hemolysis Index: 6 (ref 0–18)

## 2019-02-18 LAB — GFR: EGFR: >60

## 2019-02-18 MED ORDER — BLOOD PRESSURE MONITOR KIT
PACK | 0 refills | Status: AC
Start: 2019-02-18 — End: ?

## 2019-02-18 NOTE — Progress Notes (Signed)
Subjective:      Patient ID: Christina Torres is a 53 y.o. female  Chief Complaint   Patient presents with    Hypertension     Pt denies any CP, sob, or palpitations. Reports compliance with meds.          HPI    Problem   Obstructive Sleep Apnea Syndrome    Uses CPAP nightly. No concerns.      Essential Hypertension    Diagnosed around 2013 by previous PCP. At the time said it was 'mild'. Does have family hx of HTN.   Previous PCP started pt on and continues HCTZ qd (was 12.5, increased to 25mg  04/2015).  Had hyponatremia and was taken off HCTZ. Tries to watch salt, not exercising, not following formal diet.   Since last visit, on lisinopril 10mg  daily and atenolol 100mg  daily. Has been 150-170s.   Exercising at [facility]. Tries to avoid salty foods, did have BBQ wings today.     Since last visit: not exercising as much anymore. Stopped tai chi due to hip issues, but now trying to go for more walks. Taking lisinopril 20mg  daily and atenolol 100mg  daily. Hasn't checked home readings. Would like to see if insurance will cover.    BP Readings from Last 3 Encounters:   02/18/19 139/87   01/01/19 132/69   08/07/18 146/81       As of May 2019: pt has been doing well on medication, no SE. Does not check BP at home but will get a cuff. Recent ER visits for chest pain and elevated at that time. EKG normal and chest pain likely related to GERD. BP normal today. No other symptoms.     Bipolar 1 Disorder    Sees psych Dr. Theda Belfast, [facility] who manages medication. Trileptal 600 qam, 900 qpm, Seroquel qpm, trazodone qhs, Sapris qhs prn sleep.      Since last visit: has been doing well. No concerns. Sees psych regularly.         Gerd controlled off PPI.     The following portions of the patient's history were reviewed and updated as appropriate: current medications, allergies, past family history, past medical history, past social history, past surgical history and problem list.     Review  of Systems   Constitutional: Negative.    Respiratory: Negative.    Cardiovascular: Negative.          BP 139/87    Pulse 72    Temp 97.5 F (36.4 C) (Temporal)    Wt 92.5 kg (204 lb)    LMP 01/04/2019    SpO2 99%    BMI 35.26 kg/m    Objective:   Physical Exam   Constitutional: She is oriented to person, place, and time. She appears well-developed and well-nourished. No distress.   Eyes: Conjunctivae are normal.   Cardiovascular: Normal rate, regular rhythm and normal heart sounds.   Pulmonary/Chest: Effort normal and breath sounds normal.   Neurological: She is alert and oriented to person, place, and time.   Psychiatric: She has a normal mood and affect.   Nursing note and vitals reviewed.      Assessment:     1. Essential hypertension  Blood Pressure Monitor Kit    CBC without differential    Comprehensive metabolic panel    Lipid panel   2. Obstructive sleep apnea syndrome     3. Bipolar 1 disorder  Plan:     53 y.o. female presents for follow up.     1. HTN: well controlled   -continue lisinopril 20mg  daily  -continue atenolol 100mg  daily  -encouraged to continue to limit salt in diet  -Rx sent for home machine as requested   -counseled on signs/symptoms to warrant re-evaluation   -care plan letter UTD    2. OSA: compliant with CPAP  -continue CPAP use    3. GERD: previous treatment with PPI improved chest pain, now stable off PPI  -counseled at length on diet, lifestyle changes  -resume PPI if needed    4. Bipolar disorder: stable  -continue current meds  -follow up with psych as recommended     Health maintenance:  -Fasting blood work: ordered, discussed, agreed to planned labs   -Immunizations: declined shingrix today, discuss tdap at next visit   -STD screen: low risk  -Pap: UTD  -Mammogram:UTD  -DEXA: due at 59  -Colonoscopy: UTD  -Vision: discuss at future visit   -Dental: discuss at future visit   -Advanced directive: discuss at future visit     Return in about 6 months (around 08/20/2019)  for follow up bp.    Gust Brooms, MD

## 2019-02-18 NOTE — Patient Instructions (Signed)
Prevention Guidelines, Women Ages 50 to 64  Screening tests and vaccines are an important part of managing your health. A screening test is done to find possible disorders or diseases in people who don't have any symptoms. The goal is to find a disease early so lifestyle changes can be made and you can be watched more closely to reduce the risk of disease, or to detect it early enough to treat it most effectively. Screening tests are not considered diagnostic, but are used to determine if more testing is needed.Health counseling is essential, too. Below are guidelines for these, for women ages 50 to 64. Talk with your healthcare provider to make sure you're up to date on what you need.  Screening Who needs it How often   Type 2 diabetes or prediabetes All women beginning at age 45 and women without symptoms at any age who are overweight or obese and have 1 or more additional risk factors for diabetes. At least every 3 years   Type 2 diabetes or prediabetes All women diagnosed with gestational diabetes Lifelong testing every 3 years   Type 2 diabetes All women with prediabetes Every year   Alcohol misuse All women in this age group At routine exams   Blood pressure All women in this age group Yearly checkup if your blood pressure is normal  Normal blood pressure is less than 120/80 mm Hg  If your blood pressure reading is higher than normal, follow the advice of your healthcare provider   Breast cancer All women at average risk in this age group Yearly mammogram should be done until age 54. At age 55, you can switch to every other year or choose to continue yearly.  All women should know the possible benefits and risks of breast cancer screening with mammograms.   Cervical cancer All women in this age group, except women who have had a complete hysterectomy Pap test every 3 years or Pap test with human papillomavirus (HPV) test every 5 years   Chlamydia Women at increased risk for infection At routine exams    Colorectal cancer All women at average risk in this age group Multiple tests are available and are used at different times. Possible tests include:   Flexible sigmoidoscopy every 5 years, or   Colonoscopy every 10 years, or   CT colonography (virtual colonoscopy) every 5 years, or   Yearly fecal occult blood test, or   Yearly fecal immunochemical test every year, or   Stool DNA test, every 3 years  If you choose a test other than a colonoscopy and have an abnormal test result, you will need to follow up with a colonoscopy. Screening advice varies among expert groups. Talk with your healthcare provider about which tests are best for you.  Some people should be screened using a different schedule because of their personal or family health history. Talk with your healthcare provider about your health history.   Depression All women in this age group At routine exams   Gonorrhea Sexually active women at increased risk for infection At routine exams   Hepatitis C Anyone at increased risk; 1 time for those born between 1945 and 1965 At routine exams   High cholesterol or triglycerides All women in this age group who are at risk for coronary artery disease At least every 5 years   HIV All women At routine exams   Lung cancer Adults age 55 to 80 who have smoked Yearly screening in smokers with 30 pack-year   history of smoking or who quit within 15 years   Obesity All women in this age group At routine exams   Osteoporosis Women who are postmenopausal Ask your healthcare provider   Syphilis Women at increased risk for infection - talk with your healthcare provider At routine exams   Tuberculosis Women at increased risk for infection - talk with your healthcare provider Ask your healthcare provider   Vision All women in this age group Ask your healthcare provider   Vaccine Who needs it How often   Chickenpox (varicella) All women in this age group who have no record of this infection or vaccine 2 doses; the second dose  should be given at least 4 weeks after the first dose   Hepatitis A Women at increased risk for infection - talk with your healthcare provider 2 doses given at least 6 months apart   Hepatitis B Women at increased risk for infection - talk with your healthcare provider 3 doses over 6 months; second dose should be given 1 month after the first dose; the third dose should be given at least 2 months after the second dose and at least 4 months after the first dose   Haemophilus influenzae Type B (HIB) Women at increased risk for infection - talk with your healthcare provider 1 to 3 doses   Influenza (flu) All women in this age group Once a year   Measles, mumps, rubella (MMR) Women in this age group through their late 50s who have no record of these infections or vaccines 1 dose   Meningococcal Women at increased risk for infection - talk with your healthcare provider 1 or more doses   Pneumococcal conjugate vaccine (PCV13) and pneumococcal polysaccharide vaccine (PPSV23) Women at increased risk for infection - talk with your healthcare provider PCV13: 1 dose ages 19 to 65 (protects against 13 types of pneumococcal bacteria)  PPSV23: 1 to 2 doses through age 64, or 1 dose at 65 or older (protects against 23 types of pneumococcal bacteria)   Tetanus/diphtheria/pertussis (Td/Tdap) booster All women in this age group Td every 10 years, or a 1-time dose of Tdap instead of a Td booster after age 18, then Td every 10 years   Zoster All women ages 60 and older 1 dose   Counseling Who needs it How often   BRCA gene mutation testing for breast and ovarian cancer susceptibility Women with increased risk for having gene mutation When your risk is known   Breast cancer and chemoprevention Women at high risk for breast cancer When your risk is known   Diet and exercise Women who are overweight or obese When diagnosed, and then at routine exams   Sexually transmitted infection prevention Women at increased risk for infection - talk  with your healthcare provider At routine exams   Use of daily aspirin Women ages 55 and up in this age group who are at risk for cardiovascular health problems such as stroke When your risk is known   Use of tobacco and the health effects it can cause All women in this age group Every exam   1 American [employer] Society  [employer] last reviewed this educational content on 09/16/2014   2000-2020 [employer] [address], [place] [address]. All rights reserved. This information is not intended as a substitute for professional medical care. Always follow your healthcare professional's instructions.

## 2019-02-18 NOTE — Progress Notes (Signed)
Have you seen any specialists/other providers since your last visit with us?    No    Arm preference verified?   Yes    The patient is due for shingles vaccine and advanced directive, and [facility] care plan letter.

## 2019-03-07 NOTE — Progress Notes (Signed)
error 

## 2019-03-12 ENCOUNTER — Encounter (INDEPENDENT_AMBULATORY_CARE_PROVIDER_SITE_OTHER): Payer: Self-pay | Admitting: Internal Medicine

## 2019-03-12 ENCOUNTER — Ambulatory Visit (INDEPENDENT_AMBULATORY_CARE_PROVIDER_SITE_OTHER): Payer: PRIVATE HEALTH INSURANCE | Admitting: Internal Medicine

## 2019-03-12 VITALS — BP 147/85 | HR 67 | Temp 97.2°F | Wt 201.0 lb

## 2019-03-12 DIAGNOSIS — H6122 Impacted cerumen, left ear: Secondary | ICD-10-CM

## 2019-03-12 NOTE — Progress Notes (Signed)
Have you seen any specialists/other providers since your last visit with us?    No    Arm preference verified?   Yes    The patient is due for shingles vaccine and advanced directive

## 2019-03-12 NOTE — Patient Instructions (Signed)
Impacted Earwax     Inner ear structures including ear canal and eardrum.   Impacted earwax is a buildup of the natural wax in the ear. Impacted earwax is very common. It can cause symptoms such as hearing loss. It can also make it hard for a healthcare provider to check your ear.   Understanding earwax  Tiny glands in your ear make substances that combine with dead skin cells to form earwax. Earwax helps protect your ear canal from water, dirt, infection, and injury. Over time, earwax travels from the inner part of your ear canal to the entrance of the canal. Then it falls away naturally. But in some cases, it can’t travel to the entrance of the canal. This may be because of a health condition or objects put in the ear. With age, earwax tends to become harder and less fluid. Older adults are more likely to have problems with earwax buildup.   What causes impacted earwax?  Earwax can build up because of many health conditions. Some cause a physical blockage. Others cause too much earwax to be made. Health conditions that can cause earwax buildup include:   · Bony blockage in the ear (osteoma or exostoses)  · Infections, such as an outer ear infection (external otitis)  · Skin disease, such as eczema  · Autoimmune diseases, such as lupus  · A narrowed ear canal from birth, chronic inflammation, or injury  · Too much earwax because of injury  · Too much earwax because of  water in the ear canal  Putting objects in the ear again and again can also cause impacted earwax. For example, putting cotton swabs in the ear may push the wax deeper into the ear. Over time, this may cause blockage. Hearing aids, swimming plugs, and swim molds can also cause this problem when used again and again.   In some cases, the cause of impacted earwax is not known.  Symptoms of impacted earwax  Excess earwax often does not cause any symptoms, unless there is a large amount of buildup. Then it may cause symptoms such as:   · Hearing  loss  · Earache  · Sense of ear fullness  · Itching in the ear  · Odor from the ear  · Ear drainage  · Dizziness  · Ringing in the ears  · Cough  Treatment for impacted earwax  If you don’t have symptoms, you may not need treatment. Often the earwax goes away on its own with time. If you have symptoms, you may have 1 or more treatments such as:   · Ear drops to soften the earwax. This helps it leave the ear over time.  · Rinsing the ear canal with water. This is done in a healthcare provider’s office.  · Removing the earwax with small tools. This is also done in a provider’s office.  In rare cases, some treatments for earwax removal may cause complications such as:  · Outer ear infection  · Earache  · Short-term hearing loss  · Dizziness  · Water trapped in the ear canal  · Hole in the eardrum  · Ringing in the ears  · Bleeding from the ear  Talk with your healthcare provider about which risks apply most to you.  Healthcare providers don't advise using ear candles or ear vacuum kits. These methods are not shown to work and may cause problems.   Preventing impacted earwax  You may not be able to prevent impacted earwax if you have   a health condition that causes it, such as eczema. In other cases, you may be able to prevent earwax buildup by:   · Using ear drops once a week  · Having a regular ear cleaning about every 6 months  · Not using cotton swabs in the ear  When to call the healthcare provider  Call your healthcare provider right away if you have:   · Symptoms of impacted earwax  · Severe symptoms after earwax removal, such as bleeding or severe ear pain    [employer] last reviewed this educational content on 04/22/2018  © 2000-2020 [employer] [address], [place] [address]. All rights reserved. This information is not intended as a substitute for professional medical care. Always follow your healthcare professional's instructions.

## 2019-03-12 NOTE — Progress Notes (Signed)
Subjective:      Patient ID: Christina Torres is a 53 y.o. female  Chief Complaint   Patient presents with    left ear feels clogged     Pt has been using ear plugs at night. Denies any current congestion. Not sure if it is wax. Denies any ear pain.          Ear Fullness    There is pain in both (L>R) ears. This is a new problem. The current episode started in the past 7 days. The problem occurs constantly. The problem has been gradually worsening. There has been no fever. Pertinent negatives include no coughing, ear discharge, rhinorrhea or sore throat. She has tried nothing for the symptoms.     Feels full. Sleeps weith ear plugs, but feels full. Also notes on right, but left worse.     The following portions of the patient's history were reviewed and updated as appropriate: current medications, allergies, past family history, past medical history, past social history, past surgical history and problem list.     Review of Systems   HENT: Negative for ear discharge, rhinorrhea and sore throat.    Respiratory: Negative for cough.          BP 147/85    Pulse 67    Temp 97.2 F (36.2 C) (Temporal)    Wt 91.2 kg (201 lb)    LMP 02/02/2019    SpO2 98%    BMI 34.74 kg/m    Objective:   Physical Exam   Constitutional: She appears well-developed and well-nourished. No distress.   HENT:   Right Ear: Tympanic membrane and ear canal normal.   Cerumen impaction noted on left   Pulmonary/Chest: Effort normal.   Neurological: She is alert.   Psychiatric: She has a normal mood and affect.   Nursing note and vitals reviewed.    Impacted cerumen noted in left ear.  Irrigation was performed by Rolly Salter, LPN.  Reevaluation showed complete removal of cerumen bilaterally.  Patient reported resolution of her symptoms.  Counseled on use of hydrogen peroxide versus Debrox drops for maintenance and to prevent recurrence of impaction.   Assessment:     1. Impacted cerumen of left ear           Plan:     53 y.o. female presents for  cerumen impaction in left ear. S/P removal with irrigation. Pt reports resolution of symptoms.   -advised to use debrox/hydrogen peroxide drops 1-2 times a week to prevent recurrence  -handout given  -counseled on signs/symptoms to warrant re-evaluation     Return for follow up current symptoms as needed.     Gust Brooms, MD

## 2019-04-30 ENCOUNTER — Telehealth (INDEPENDENT_AMBULATORY_CARE_PROVIDER_SITE_OTHER): Payer: Self-pay

## 2019-04-30 NOTE — Telephone Encounter (Signed)
COVID-19 Ambulatory Phone Screening:    Are you currently experiencing fever, cough, or shortness of breath? No    If no to above: Are you currently experiencing chills, sore throat, new headache, loss of taste or smell, or body aches not attributable to physical activity? No

## 2019-05-01 ENCOUNTER — Encounter (INDEPENDENT_AMBULATORY_CARE_PROVIDER_SITE_OTHER): Payer: Self-pay | Admitting: Family Nurse Practitioner

## 2019-05-01 ENCOUNTER — Ambulatory Visit (INDEPENDENT_AMBULATORY_CARE_PROVIDER_SITE_OTHER): Payer: PRIVATE HEALTH INSURANCE | Admitting: Family Nurse Practitioner

## 2019-05-01 VITALS — BP 163/83 | HR 80 | Temp 98.7°F | Ht 64.0 in | Wt 206.0 lb

## 2019-05-01 DIAGNOSIS — Z23 Encounter for immunization: Secondary | ICD-10-CM

## 2019-05-01 DIAGNOSIS — L02224 Furuncle of groin: Secondary | ICD-10-CM

## 2019-05-01 NOTE — Progress Notes (Signed)
Have you seen any specialists/other providers since your last visit with [place]?    No    Arm preference verified?   YES    The patient is due for advance directive, shingrix, influenza vaccine

## 2019-05-01 NOTE — Progress Notes (Signed)
Subjective:      Patient ID: Christina Torres is a 53 y.o. female     Chief Complaint   Patient presents with    Cyst     Pt reports folliculitis in groin area. Pt experienced bloody drainage yesterday.         Rash   This is a new problem. The current episode started in the past 7 days. The problem has been gradually improving since onset. The affected locations include the groin (left). The rash is characterized by blistering, draining and pain. She was exposed to nothing. Pertinent negatives include no anorexia, fatigue, fever or vomiting.    Patient reports large pimple in left groin fold that ruptured yesterday at which time she had a moderate amount of bloody drainage. Denies pus like drainage. Mild pain. Has been coving with gauze, no additional drainage. No treatments tried, nothing makes better or worse. Had similar spot two weeks ago that resolved on its own.         The following sections were reviewed this encounter by the provider:   Tobacco   Allergies   Meds   Problems   Med Hx   Surg Hx   Fam Hx          Review of Systems   Constitutional: Negative for fatigue and fever.   Gastrointestinal: Negative for anorexia, nausea and vomiting.   Musculoskeletal: Negative for myalgias.   Skin: Positive for rash.   Neurological: Negative for headaches.          BP 163/83    Pulse 80    Temp 98.7 F (37.1 C)    Ht 1.626 m (5\' 4" )    Wt 93.4 kg (206 lb)    SpO2 98%    BMI 35.36 kg/m     Objective:     Physical Exam  Vitals signs reviewed.   Constitutional:       Appearance: Normal appearance. She is overweight.   HENT:      Head: Normocephalic and atraumatic.   Eyes:      Conjunctiva/sclera: Conjunctivae normal.   Cardiovascular:      Rate and Rhythm: Normal rate and regular rhythm.      Heart sounds: Normal heart sounds, S1 normal and S2 normal.   Pulmonary:      Effort: Pulmonary effort is normal.      Breath sounds: Normal breath sounds and air entry.   Genitourinary:     Exam position: Supine.   Skin:      General: Skin is warm and dry.      Capillary Refill: Capillary refill takes less than 2 seconds.          Neurological:      Mental Status: She is alert.          Assessment/Plan     1. Boil of groin  New  Recommend keeping site clean, dry and intact  Ok to cover with gauze, recommend changing frequently to keep site clean  Recommend OTC ibuprofen prn for discomfort  Discussed s/sx that warrant immediate follow up. Will rx antibiotics if worsens or if continues to drain or if develops pus like drainage  Patient in agreement with above plan    2. Need for vaccination  - Flu vacc quad recombinant pres free 18 yrs& up       Return if symptoms worsen or fail to improve.     Leticia Clas, FNP

## 2019-05-17 ENCOUNTER — Ambulatory Visit (INDEPENDENT_AMBULATORY_CARE_PROVIDER_SITE_OTHER): Payer: PRIVATE HEALTH INSURANCE | Admitting: Family

## 2019-05-20 ENCOUNTER — Encounter (INDEPENDENT_AMBULATORY_CARE_PROVIDER_SITE_OTHER): Payer: Self-pay | Admitting: Family

## 2019-05-20 ENCOUNTER — Telehealth (INDEPENDENT_AMBULATORY_CARE_PROVIDER_SITE_OTHER): Payer: Self-pay | Admitting: Internal Medicine

## 2019-05-20 ENCOUNTER — Ambulatory Visit (INDEPENDENT_AMBULATORY_CARE_PROVIDER_SITE_OTHER): Payer: PRIVATE HEALTH INSURANCE | Admitting: Family

## 2019-05-20 VITALS — BP 147/81 | HR 80 | Temp 98.7°F | Wt 206.0 lb

## 2019-05-20 DIAGNOSIS — L989 Disorder of the skin and subcutaneous tissue, unspecified: Secondary | ICD-10-CM

## 2019-05-20 NOTE — Progress Notes (Signed)
Have you seen any specialists/other providers since your last visit with [place]?    Yes  ER   Arm preference verified?   Yes    The patient is due for shingles vaccine     Pt mentioned going to the ER on 9/26 due to rapid heart rate.

## 2019-05-20 NOTE — Progress Notes (Signed)
Subjective:      Patient ID: Christina Torres is a 53 y.o. female     Chief Complaint   Patient presents with    Recurrent Skin Infections     on vagina, boils burst and blood comes out, pt reports they are painful.         Rash  This is a recurrent (note rash but more skin lesions) problem. The problem has been gradually improving since onset. The affected locations include the groin (pubic hair area). The rash is characterized by redness (but not currently). She was exposed to nothing (does use deodorant in pubic hair). Pertinent negatives include no fatigue, fever or joint pain. Past treatments include nothing.       The following sections were reviewed this encounter by the provider:        Review of Systems   Constitutional: Negative for fatigue and fever.   Gastrointestinal: Negative for nausea.   Musculoskeletal: Negative for joint pain and myalgias.   Skin: Negative for rash and wound.        Recurrent skin lesion - appears to be healing   Neurological: Negative for headaches.          BP 147/81    Pulse 80    Temp 98.7 F (37.1 C)    Wt 93.4 kg (206 lb)    LMP 04/10/2019    SpO2 98%    BMI 35.36 kg/m     Objective:     Physical Exam  Vitals signs and nursing note reviewed.   Constitutional:       Appearance: Normal appearance. She is overweight.   HENT:      Head: Normocephalic and atraumatic.   Cardiovascular:      Rate and Rhythm: Normal rate.      Heart sounds: S1 normal and S2 normal.   Pulmonary:      Effort: Pulmonary effort is normal.      Breath sounds: Normal air entry.   Genitourinary:     Exam position: Supine.   Skin:     General: Skin is warm and dry.      Capillary Refill: Capillary refill takes less than 2 seconds.          Neurological:      Mental Status: She is alert.          Assessment/Plan     1. Recurrent skin lesions  - referral to derm given today    Recurrent no current s/s of infection  We discuss not using Deoderant in pubic area.  Recommend keeping site clean, dry and intact   Recommend OTC ibuprofen prn for discomfort  Discussed s/sx that warrant immediate follow up.   Patient in agreement with above plan    At end of visit pt reports she went to ER last week as she ran out of atenolol for increase HR and HR normal today and no symptoms and encouraged her to schedule ER f/u with PCP if any concerns as recommended after ER visit.    No follow-ups on file.     Balinda Quails, FNP

## 2019-05-20 NOTE — Telephone Encounter (Signed)
Pt called and would like to pick up a copy of her derm referral that was placed by Claris Gower NP. Pt states she will come by tomorrow and pick it up. Please advise, thank you.

## 2019-05-30 ENCOUNTER — Ambulatory Visit (INDEPENDENT_AMBULATORY_CARE_PROVIDER_SITE_OTHER): Payer: PRIVATE HEALTH INSURANCE | Admitting: Family

## 2019-06-06 ENCOUNTER — Telehealth (INDEPENDENT_AMBULATORY_CARE_PROVIDER_SITE_OTHER): Payer: PRIVATE HEALTH INSURANCE | Admitting: Internal Medicine

## 2019-07-01 ENCOUNTER — Encounter (INDEPENDENT_AMBULATORY_CARE_PROVIDER_SITE_OTHER): Payer: Self-pay | Admitting: Family Medicine

## 2019-07-01 ENCOUNTER — Ambulatory Visit (INDEPENDENT_AMBULATORY_CARE_PROVIDER_SITE_OTHER): Payer: PRIVATE HEALTH INSURANCE | Admitting: Family Medicine

## 2019-07-01 VITALS — BP 152/79 | HR 74 | Temp 98.0°F | Ht 64.0 in | Wt 204.0 lb

## 2019-07-01 DIAGNOSIS — R002 Palpitations: Secondary | ICD-10-CM

## 2019-07-01 DIAGNOSIS — I10 Essential (primary) hypertension: Secondary | ICD-10-CM

## 2019-07-01 NOTE — Progress Notes (Signed)
Subjective:      Date: 07/01/2019 6:43 PM   Patient ID: Christina Torres is a 53 y.o. female.    Chief Complaint:  Chief Complaint   Patient presents with    Hypertension     Pt had gone to ER for blood bp meds to be filled while in [place]. Bp reports that she called the rescue squad prior to father pasing away. Pt had a high HR. Wondering if she should have the atenolol increased. No changes were made in the ER. Only given a pill of the atenolol.        HPI:  HPI patient an established patient of Dr. Otilio Jefferson- new to me,  presents in follow-up from recent emergency room visit dated 05/18/2019.     At that time she had been out of her blood pressure medication (atenolol) times close to 5 days and had noted that her heart was racing/palpitations which awoke her from sleep.   She also had a brief period of chest pain.      On review of medical records from this ER visit     Blood pressure at that time was 193/86 and heart rate was 113    EKG: No acute changes.  Showed sinus tachycardia. Possible old anterior infarct. Normal axis. Normal PR and QTC. No ectopy. No STEMI.  No changes compared to prior EKG of Jan 17, 2018    H/H 11.6/36.2  TROPONIN T - Normal   Trop T <0.010     Chest x-Franchino:  No acute lobar consolidation, large effusion or pulmonary edema.     Home blood pressures are reported as being higher- 150-160's as of late on routine medication regimen    No problems updated.      Problem List:  Patient Active Problem List   Diagnosis    Essential hypertension    Bipolar 1 disorder    Iron deficiency anemia due to chronic blood loss    Fibroid uterus    Cerebral aneurysm    Obstructive sleep apnea syndrome       Current Medications:  Current Outpatient Medications   Medication Sig Dispense Refill    asenapine Maleate (SAPHRIS) 10 MG SL Tab Place 10 mg under the tongue nightly.      atenolol (TENORMIN) 100 MG tablet TAKE 1 TABLET BY MOUTH EVERY DAY 90 tablet 1    Blood Pressure Monitor Kit Use as directed. 1 each  0    IRON PO Take by mouth.      lisinopril (ZESTRIL) 20 MG tablet Take 1 tablet (20 mg total) by mouth daily 90 tablet 1    OXcarbazepine (TRILEPTAL) 600 MG tablet Take 600 mg by mouth 2 (two) times daily.Once in morning and once in evening          QUEtiapine (SEROQUEL) 400 MG tablet Take 800 mg by mouth nightly.        traZODone (DESYREL) 150 MG tablet Take 150 mg by mouth nightly As needed      ibuprofen (ADVIL,MOTRIN) 800 MG tablet Take 800 mg by mouth every 6 (six) hours as needed for Pain       No current facility-administered medications for this visit.        Allergies:  Allergies   Allergen Reactions    Hctz [Hydrochlorothiazide] Other (See Comments)     Hyponatremia      Penicillins Angioedema    Clindamycin Rash    Lamictal [Lamotrigine] Rash  Past Medical History:  Past Medical History:   Diagnosis Date    Anemia     PMH    Aneurysm     2 aneurysm each side of nose. monitored closely    Bipolar 1 disorder     Eczema     Fibroid tumor     Gastroesophageal reflux disease     Headache     Hypertension     OSA (obstructive sleep apnea) 01/2016       Past Surgical History:  Past Surgical History:   Procedure Laterality Date    COLONOSCOPY      LAPAROSCOPIC, LYSIS, ADHESIONS  07/28/2015    Procedure: LAPAROSCOPIC, LYSIS, ADHESIONS;  Surgeon: Dondra Prader, MD;  Location: [facility];  Service: Gynecology;;  Robotic laparoscopic lysis of pelvic and rectosigmoid adhesions    MYOMECTOMY      ROBOTIC, MYOMECTOMY N/A 07/28/2015    Procedure: ROBOT ASSISTED, LAPAROSCOPIC, MYOMECTOMY;  Surgeon: Dondra Prader, MD;  Location: [facility];  Service: Gynecology;  Laterality: N/A;  ROBOTIC MYOMECTOMY (LAP)      UTERINE FIBROID EMBOLIZATION         Family History:  Family History   Problem Relation Age of Onset    Cancer Mother         colon    Hypertension Mother     Hypertension Father     Diabetes Father     Heart failure Father     Breast cancer Neg Hx         Social History:  Social History     Socioeconomic History    Marital status: Single     Spouse name: Not on file    Number of children: Not on file    Years of education: Not on file    Highest education level: Not on file   Occupational History    Not on file   Social Needs    Financial resource strain: Not on file    Food insecurity     Worry: Not on file     Inability: Not on file    Transportation needs     Medical: Not on file     Non-medical: Not on file   Tobacco Use    Smoking status: Never Smoker    Smokeless tobacco: Never Used   Substance and Sexual Activity    Alcohol use: No     Alcohol/week: 0.0 standard drinks    Drug use: No    Sexual activity: Not Currently     Partners: Male   Lifestyle    Physical activity     Days per week: Not on file     Minutes per session: Not on file    Stress: Not on file   Relationships    Social connections     Talks on phone: Not on file     Gets together: Not on file     Attends religious service: Not on file     Active member of club or organization: Not on file     Attends meetings of clubs or organizations: Not on file     Relationship status: Not on file    Intimate partner violence     Fear of current or ex partner: Not on file     Emotionally abused: Not on file     Physically abused: Not on file     Forced sexual activity: Not on file   Other Topics  Concern    Not on file   Social History Narrative    Not on file       The following sections were reviewed this encounter by the provider:   Tobacco   Allergies   Meds   Problems   Med Hx   Surg Hx   Fam Hx          ROS:  Review of Systems   Constitutional: Negative for activity change and fatigue.   Eyes: Negative for pain.   Respiratory: Negative for chest tightness and shortness of breath.    Cardiovascular: Positive for palpitations (forgot to take her medication the night prior until late- took half a tab this am). Negative for chest pain and leg swelling.   Musculoskeletal: Negative for  neck pain.   Neurological: Negative for dizziness, syncope, weakness, light-headedness and headaches.   Psychiatric/Behavioral: Negative for sleep disturbance. The patient is not nervous/anxious.         Objective:     Vitals:  BP 152/79    Pulse 74    Temp 98 F (36.7 C) (Temporal)    Ht 1.626 m (5\' 4" )    Wt 92.5 kg (204 lb)    SpO2 99%    BMI 35.02 kg/m     Physical Exam:  Physical Exam  Vitals signs and nursing note reviewed.   Constitutional:       General: She is not in acute distress.     Appearance: She is well-developed.   Eyes:      Conjunctiva/sclera: Conjunctivae normal.   Cardiovascular:      Rate and Rhythm: Normal rate and regular rhythm.      Heart sounds: Normal heart sounds.   Pulmonary:      Effort: Pulmonary effort is normal.      Breath sounds: Normal breath sounds.   Neurological:      Mental Status: She is alert and oriented to person, place, and time.         Assessment/Plan:       1. Essential hypertension    2. Intermittent palpitations  Chronic/not controlled.  However patient not on regular medication dosage  Suspect palpitations indicated are secondary to being off medication as prescribed.  However if symptoms are persistent and blood pressure still elevated would probably warrant increasing blood pressure medication.  Recommend optimizing therapeutic lifestyle changes and eating a heart healthy diet ( i.e. DASH diet - www.heart.org or PopSteam.is ). Recommend checking ambulatory blood pressures once or twice a day and document in a log for review.  Bring the log and blood pressure cuff into the office at the next follow-up visit in 2 weeks, sooner as needed.      Return in about 2 weeks (around 07/15/2019) for HTN recheck.  Follow up for persistent or worsening symptoms.      Time spent in medical discussion and review of medical records: 11 to 20 minutes        Philipp Ovens, MD

## 2019-07-01 NOTE — Progress Notes (Signed)
Have you seen any specialists/other providers since your last visit with us?    No    Arm preference verified?   Yes    The patient is due for shingles vaccine and advanced directive

## 2019-07-16 ENCOUNTER — Encounter (INDEPENDENT_AMBULATORY_CARE_PROVIDER_SITE_OTHER): Payer: Self-pay | Admitting: Family Medicine

## 2019-07-16 ENCOUNTER — Ambulatory Visit (INDEPENDENT_AMBULATORY_CARE_PROVIDER_SITE_OTHER): Payer: PRIVATE HEALTH INSURANCE | Admitting: Family Medicine

## 2019-07-16 VITALS — BP 157/82 | HR 84 | Temp 98.4°F | Ht 64.0 in | Wt 206.0 lb

## 2019-07-16 DIAGNOSIS — I10 Essential (primary) hypertension: Secondary | ICD-10-CM

## 2019-07-16 MED ORDER — LISINOPRIL 40 MG PO TABS
40.0000 mg | ORAL_TABLET | Freq: Every day | ORAL | 0 refills | Status: DC
Start: 2019-07-16 — End: 2019-08-12

## 2019-07-16 NOTE — Progress Notes (Signed)
Subjective:      Date: 07/16/2019 8:08 PM   Patient ID: Christina Torres is a 53 y.o. female.    Chief Complaint:  Chief Complaint   Patient presents with    Hypertension       HPI:  HPI   Presents in follow-up for elevated  Blood pressures.  No complaints.  Blood pressure in the interim had been SBP: 160-180s. Home blood pressure monitor checked in office read approximately 10/2 higher than in office blood pressures .  The patient has essential HTN with no evidence of CHF or proteinuria.  The patient has been compliant with anti-hypertensive therapy in the interval since her last visit 2 weeks prior.  She denies side effects to therapy.  Pt denies CP, SOB, dizziness, orthopnea, PND or edema.    No problems updated.      Problem List:  Patient Active Problem List   Diagnosis    Essential hypertension    Bipolar 1 disorder    Iron deficiency anemia due to chronic blood loss    Fibroid uterus    Cerebral aneurysm    Obstructive sleep apnea syndrome       Current Medications:  Current Outpatient Medications   Medication Sig Dispense Refill    asenapine Maleate (SAPHRIS) 10 MG SL Tab Place 10 mg under the tongue nightly.      atenolol (TENORMIN) 100 MG tablet TAKE 1 TABLET BY MOUTH EVERY DAY 90 tablet 1    Blood Pressure Monitor Kit Use as directed. 1 each 0    IRON PO Take by mouth.      OXcarbazepine (TRILEPTAL) 600 MG tablet Take 600 mg by mouth 2 (two) times daily.Once in morning and once in evening          QUEtiapine (SEROQUEL) 400 MG tablet Take 800 mg by mouth nightly.        traZODone (DESYREL) 150 MG tablet Take 150 mg by mouth nightly As needed      ibuprofen (ADVIL,MOTRIN) 800 MG tablet Take 800 mg by mouth every 6 (six) hours as needed for Pain      lisinopril (ZESTRIL) 40 MG tablet Take 1 tablet (40 mg total) by mouth daily 90 tablet 0     No current facility-administered medications for this visit.        Allergies:  Allergies   Allergen Reactions    Hctz [Hydrochlorothiazide] Other (See  Comments)     Hyponatremia      Penicillins Angioedema    Clindamycin Rash    Lamictal [Lamotrigine] Rash       Past Medical History:  Past Medical History:   Diagnosis Date    Anemia     PMH    Aneurysm     2 aneurysm each side of nose. monitored closely    Bipolar 1 disorder     Eczema     Fibroid tumor     Gastroesophageal reflux disease     Headache     Hypertension     OSA (obstructive sleep apnea) 01/2016       Past Surgical History:  Past Surgical History:   Procedure Laterality Date    COLONOSCOPY      LAPAROSCOPIC, LYSIS, ADHESIONS  07/28/2015    Procedure: LAPAROSCOPIC, LYSIS, ADHESIONS;  Surgeon: Dondra Prader, MD;  Location: [facility];  Service: Gynecology;;  Robotic laparoscopic lysis of pelvic and rectosigmoid adhesions    MYOMECTOMY      ROBOTIC, MYOMECTOMY N/A  07/28/2015    Procedure: ROBOT ASSISTED, LAPAROSCOPIC, MYOMECTOMY;  Surgeon: Dondra Prader, MD;  Location: [facility];  Service: Gynecology;  Laterality: N/A;  ROBOTIC MYOMECTOMY (LAP)      UTERINE FIBROID EMBOLIZATION         Family History:  Family History   Problem Relation Age of Onset    Cancer Mother         colon    Hypertension Mother     Hypertension Father     Diabetes Father     Heart failure Father     Breast cancer Neg Hx        Social History:  Social History     Socioeconomic History    Marital status: Single     Spouse name: Not on file    Number of children: Not on file    Years of education: Not on file    Highest education level: Not on file   Occupational History    Not on file   Social Needs    Financial resource strain: Not on file    Food insecurity     Worry: Not on file     Inability: Not on file    Transportation needs     Medical: Not on file     Non-medical: Not on file   Tobacco Use    Smoking status: Never Smoker    Smokeless tobacco: Never Used   Substance and Sexual Activity    Alcohol use: No     Alcohol/week: 0.0 standard drinks    Drug use: No    Sexual  activity: Not Currently     Partners: Male   Lifestyle    Physical activity     Days per week: Not on file     Minutes per session: Not on file    Stress: Not on file   Relationships    Social connections     Talks on phone: Not on file     Gets together: Not on file     Attends religious service: Not on file     Active member of club or organization: Not on file     Attends meetings of clubs or organizations: Not on file     Relationship status: Not on file    Intimate partner violence     Fear of current or ex partner: Not on file     Emotionally abused: Not on file     Physically abused: Not on file     Forced sexual activity: Not on file   Other Topics Concern    Not on file   Social History Narrative    Not on file       The following sections were reviewed this encounter by the provider:   Tobacco   Allergies   Meds   Problems   Med Hx   Surg Hx   Fam Hx          ROS:  Review of Systems   Constitutional: Negative for activity change and fatigue.   Eyes: Negative for pain.   Respiratory: Negative for chest tightness and shortness of breath.    Cardiovascular: Negative for chest pain, palpitations (none in the past 2 weeks) and leg swelling.   Musculoskeletal: Negative for neck pain.   Neurological: Negative for dizziness, syncope, weakness, light-headedness and headaches.   Psychiatric/Behavioral: Negative for sleep disturbance. The patient is not nervous/anxious.  Objective:     Vitals:  BP 157/82    Pulse 84    Temp 98.4 F (36.9 C) (Temporal)    Ht 1.626 m (5\' 4" )    Wt 93.4 kg (206 lb)    SpO2 97%    BMI 35.36 kg/m      BP Readings from Last 3 Encounters:   07/16/19 157/82   07/01/19 152/79   05/20/19 147/81         Physical Exam:  Physical Exam  Vitals signs and nursing note reviewed.   Constitutional:       General: She is not in acute distress.     Appearance: She is well-developed.   Eyes:      Conjunctiva/sclera: Conjunctivae normal.   Cardiovascular:      Rate and Rhythm: Normal rate  and regular rhythm.      Heart sounds: Normal heart sounds.   Pulmonary:      Effort: Pulmonary effort is normal.      Breath sounds: Normal breath sounds.   Neurological:      Mental Status: She is alert and oriented to person, place, and time.       Assessment/Plan:       1. Essential hypertension  - lisinopril (ZESTRIL) 40 MG tablet; Take 1 tablet (40 mg total) by mouth daily  Dispense: 90 tablet; Refill: 0    Chronic/not controlled.    Lisinopril increased from 20 mg to 40 mg.  Recommend optimizing therapeutic lifestyle changes and eating a heart healthy diet ( i.e. DASH diet - www.heart.org or PopSteam.is ). Recommend checking ambulatory blood pressures once or twice a day and document in a log for review.  Advised goal of blood pressure 130/80 or less.  Reviewed red flags which warrant re-evaluation  Medications and dosing intervals reviewed at this visit    Return in about 3 months (around 10/16/2019) for HTN recheck.  Sooner as needed.        Philipp Ovens, MD

## 2019-07-16 NOTE — Progress Notes (Signed)
Have you seen any specialists/other providers since your last visit with us?    No    Arm preference verified?   Yes    The patient is due for shingles vaccine and advanced directive

## 2019-08-01 ENCOUNTER — Telehealth (INDEPENDENT_AMBULATORY_CARE_PROVIDER_SITE_OTHER): Payer: Self-pay | Admitting: Nurse Practitioner

## 2019-08-01 NOTE — Telephone Encounter (Signed)
Pt telephoned with concern that the increased dose of lisinopril is causing to have increased urination and transient chest pain.  Advised pt thtat if she is having chest pain she needs to go to the ER for evaluation.  Pt verbalized understanding

## 2019-08-06 ENCOUNTER — Telehealth (INDEPENDENT_AMBULATORY_CARE_PROVIDER_SITE_OTHER): Payer: Self-pay | Admitting: Family Medicine

## 2019-08-06 NOTE — Telephone Encounter (Signed)
Patient would like to inform Dr. Lyndel Pleasure that the following medication is too high for her.  Please advise.    lisinopril (ZESTRIL) 40 MG tablet    [phone]

## 2019-08-09 ENCOUNTER — Telehealth (INDEPENDENT_AMBULATORY_CARE_PROVIDER_SITE_OTHER): Payer: Self-pay | Admitting: Family Medicine

## 2019-08-09 NOTE — Telephone Encounter (Signed)
Pt is requesting a refill for lisinopril (ZESTRIL) 40 MG tablet and pt needs the dosage to be lowered down to 20 MG. Please advise.      Please send to    [facility] [id number], [place] - [address] [phone] (Phone)  [phone] (Fax)         Pt contact info: [phone]        Thanks

## 2019-08-09 NOTE — Telephone Encounter (Signed)
lmtcb

## 2019-08-10 ENCOUNTER — Other Ambulatory Visit (INDEPENDENT_AMBULATORY_CARE_PROVIDER_SITE_OTHER): Payer: Self-pay | Admitting: Internal Medicine

## 2019-08-10 DIAGNOSIS — I10 Essential (primary) hypertension: Secondary | ICD-10-CM

## 2019-08-12 ENCOUNTER — Telehealth (INDEPENDENT_AMBULATORY_CARE_PROVIDER_SITE_OTHER): Payer: Self-pay | Admitting: Family Medicine

## 2019-08-12 ENCOUNTER — Telehealth (INDEPENDENT_AMBULATORY_CARE_PROVIDER_SITE_OTHER): Payer: Self-pay

## 2019-08-12 ENCOUNTER — Other Ambulatory Visit (INDEPENDENT_AMBULATORY_CARE_PROVIDER_SITE_OTHER): Payer: Self-pay | Admitting: Family Medicine

## 2019-08-12 DIAGNOSIS — I10 Essential (primary) hypertension: Secondary | ICD-10-CM

## 2019-08-12 MED ORDER — LISINOPRIL 20 MG PO TABS
20.0000 mg | ORAL_TABLET | Freq: Every day | ORAL | 0 refills | Status: DC
Start: 2019-08-12 — End: 2019-08-14

## 2019-08-12 NOTE — Telephone Encounter (Signed)
Patient is returning call from Nurse Lockie Pares. Please return call at noted phone number below.      Patient Preferred Callback Number:  [phone] (M)

## 2019-08-12 NOTE — Telephone Encounter (Signed)
Form dropped off on Friday afternoon, placed in Dr.Lender Nurse inbox forlder...    Health examination Certificate

## 2019-08-12 NOTE — Telephone Encounter (Signed)
Left a message letting pt know that request ws sent.

## 2019-08-12 NOTE — Telephone Encounter (Signed)
Patient is returning call from the nurse (Pt does not know the name) from Friday 12/18. Please return call at noted phone number below.    Pt is also following up on the forms dropped off on 12/18    Patient Preferred Callback Number: [phone]

## 2019-08-12 NOTE — Telephone Encounter (Signed)
Refill sent.

## 2019-08-12 NOTE — Telephone Encounter (Signed)
Dr. Wilkie Aye,   Pt is requesting for this to be changed to 20mg . She had called last week and reported that the dosing was too high.   I had left her a message to call back.   I haven't heard back.   Do you want her to return to office prior to adjustments?  Please advise

## 2019-08-12 NOTE — Telephone Encounter (Signed)
If patient is not able to tolerate medication at 40 mg - ok for refill at 20 mg. Goal 130/80  She does have a scheduled follow up appt - as such would prescribe  20 mg

## 2019-08-13 ENCOUNTER — Telehealth (INDEPENDENT_AMBULATORY_CARE_PROVIDER_SITE_OTHER): Payer: Self-pay

## 2019-08-13 NOTE — Telephone Encounter (Signed)
Pt informed that she needs a physical exam for the clearance for work form.   Pt will see Jill Side tomorrow for physical in office.   Pt also will need bp meds tomorrow when she comes in to be refilled.   Form given to Katie.

## 2019-08-13 NOTE — Telephone Encounter (Signed)
Pt informed that request was sent to pharm.   Advised if any questions or concerns to give [place] a call.

## 2019-08-13 NOTE — Telephone Encounter (Signed)
LVM to complete PS for apt tomorrow.

## 2019-08-14 ENCOUNTER — Encounter (INDEPENDENT_AMBULATORY_CARE_PROVIDER_SITE_OTHER): Payer: Self-pay | Admitting: Family Nurse Practitioner

## 2019-08-14 ENCOUNTER — Ambulatory Visit (INDEPENDENT_AMBULATORY_CARE_PROVIDER_SITE_OTHER): Payer: PRIVATE HEALTH INSURANCE | Admitting: Family Nurse Practitioner

## 2019-08-14 VITALS — BP 138/84 | HR 71 | Temp 97.7°F | Ht 64.0 in | Wt 207.0 lb

## 2019-08-14 DIAGNOSIS — Z833 Family history of diabetes mellitus: Secondary | ICD-10-CM

## 2019-08-14 DIAGNOSIS — I10 Essential (primary) hypertension: Secondary | ICD-10-CM

## 2019-08-14 DIAGNOSIS — Z0000 Encounter for general adult medical examination without abnormal findings: Secondary | ICD-10-CM

## 2019-08-14 DIAGNOSIS — Z789 Other specified health status: Secondary | ICD-10-CM

## 2019-08-14 MED ORDER — LISINOPRIL 20 MG PO TABS
20.0000 mg | ORAL_TABLET | Freq: Every day | ORAL | 0 refills | Status: DC
Start: 2019-08-14 — End: 2019-12-16

## 2019-08-14 NOTE — Progress Notes (Signed)
Have you seen any specialists/other providers since your last visit with us?    No    Arm preference verified?   Yes    The patient is due for advance directive, shingrix vaccine

## 2019-08-14 NOTE — Progress Notes (Signed)
Subjective:      Patient ID: Christina Torres is a 53 y.o. female.    Chief Complaint:  Chief Complaint   Patient presents with    Annual Exam     Pt is not fasting. Requesting to have form completed for work.      Hypertension     requesting refill of lisinopril       HPI    Visit Type: Health Maintenance Visit  Reported Health: good health  Reported Diet: Balanced  Reported Exercise: none  Dental: regular dental visits twice a year  Vision: glasses and regular eye exams   Hearing: normal hearing  Immunization Status: immunizations up to date, shingrix due, (hep B non immune, MMR unknown)  Reproductive Health: postmenopausal  Prior Screening Tests: colonoscopy within past 5 years, Mammo UTD, PAP due next yr  General Health Risks: family history of prostate cancer (father), family history of colon cancer (mother) and no family history of breast cancer      Problem List:  Patient Active Problem List   Diagnosis    Essential hypertension    Bipolar 1 disorder    Iron deficiency anemia due to chronic blood loss    Fibroid uterus    Cerebral aneurysm    Obstructive sleep apnea syndrome       Current Medications:  Outpatient Medications Marked as Taking for the 08/14/19 encounter (Office Visit) with Leticia Clas, FNP   Medication Sig Dispense Refill    asenapine maleate (SAPHRIS) 5 MG SL Tab Place under the tongue 2 (two) times daily      atenolol (TENORMIN) 100 MG tablet TAKE 1 TABLET BY MOUTH EVERY DAY 90 tablet 1    Blood Pressure Monitor Kit Use as directed. 1 each 0    ibuprofen (ADVIL,MOTRIN) 800 MG tablet Take 800 mg by mouth every 6 (six) hours as needed for Pain      IRON PO Take by mouth.      lisinopril (ZESTRIL) 20 MG tablet Take 1 tablet (20 mg total) by mouth daily 90 tablet 0    OXcarbazepine (TRILEPTAL) 600 MG tablet Take 600 mg by mouth 2 (two) times daily.Once in morning and once in evening          QUEtiapine (SEROQUEL) 400 MG tablet Take 800 mg by mouth nightly.        traZODone  (DESYREL) 150 MG tablet Take 150 mg by mouth nightly As needed      [DISCONTINUED] asenapine Maleate (SAPHRIS) 10 MG SL Tab Place 10 mg under the tongue nightly.      [DISCONTINUED] lisinopril (ZESTRIL) 20 MG tablet Take 1 tablet (20 mg total) by mouth daily 30 tablet 0       Allergies:  Allergies   Allergen Reactions    Hctz [Hydrochlorothiazide] Other (See Comments)     Hyponatremia      Penicillins Angioedema    Clindamycin Rash    Lamictal [Lamotrigine] Rash       Past Medical History:  Past Medical History:   Diagnosis Date    Anemia     PMH    Aneurysm     2 aneurysm each side of nose. monitored closely    Bipolar 1 disorder     Eczema     Fibroid tumor     Gastroesophageal reflux disease     Headache     Hypertension     Insomnia     OSA (obstructive sleep apnea) 01/2016  Past Surgical History:  Past Surgical History:   Procedure Laterality Date    COLONOSCOPY      LAPAROSCOPIC, LYSIS, ADHESIONS  07/28/2015    Procedure: LAPAROSCOPIC, LYSIS, ADHESIONS;  Surgeon: Dondra Prader, MD;  Location: [facility];  Service: Gynecology;;  Robotic laparoscopic lysis of pelvic and rectosigmoid adhesions    MYOMECTOMY      ROBOTIC, MYOMECTOMY N/A 07/28/2015    Procedure: ROBOT ASSISTED, LAPAROSCOPIC, MYOMECTOMY;  Surgeon: Dondra Prader, MD;  Location:  MAIN OR;  Service: Gynecology;  Laterality: N/A;  ROBOTIC MYOMECTOMY (LAP)      UTERINE FIBROID EMBOLIZATION         Family History:  Family History   Problem Relation Age of Onset    Cancer Mother         colon    Hypertension Mother     Hypertension Father     Diabetes Father     Heart failure Father     Breast cancer Neg Hx        Social History:  Social History     Tobacco Use    Smoking status: Never Smoker    Smokeless tobacco: Never Used   Substance Use Topics    Alcohol use: No     Alcohol/week: 0.0 standard drinks    Drug use: No         The following sections were reviewed this encounter by the provider:    Tobacco   Allergies   Meds   Problems   Med Hx   Surg Hx   Fam Hx            Review of Systems   Constitutional: Negative for activity change, appetite change, fatigue, fever and unexpected weight change.   HENT: Negative for dental problem, ear pain, hearing loss, tinnitus and trouble swallowing.    Eyes: Negative for pain and visual disturbance.   Respiratory: Negative for apnea, cough and shortness of breath.    Cardiovascular: Negative for chest pain and leg swelling.   Gastrointestinal: Negative for abdominal pain, constipation and diarrhea.   Endocrine: Negative for cold intolerance, heat intolerance, polydipsia, polyphagia and polyuria.   Genitourinary: Negative for difficulty urinating, dysuria, frequency, hematuria and urgency.   Musculoskeletal: Negative for arthralgias, back pain, gait problem, joint swelling and myalgias.   Skin: Negative for color change and rash.   Neurological: Negative for dizziness, weakness, numbness and headaches.   Hematological: Negative for adenopathy. Does not bruise/bleed easily.   Psychiatric/Behavioral: Negative for sleep disturbance.        PHQ-2:                 Objective:   Vitals:  BP 138/84    Pulse 71    Temp 97.7 F (36.5 C)    Ht 1.626 m (5\' 4" )    Wt 93.9 kg (207 lb)    SpO2 98%    BMI 35.53 kg/m     Physical Exam  Vitals signs reviewed.   Constitutional:       Appearance: Normal appearance. She is well-groomed. She is obese.   HENT:      Head: Normocephalic and atraumatic.      Right Ear: Tympanic membrane, ear canal and external ear normal.      Left Ear: Tympanic membrane, ear canal and external ear normal.      Nose: Nose normal.      Mouth/Throat:      Lips: Pink.  Mouth: Mucous membranes are moist.      Dentition: Normal dentition.      Tongue: No lesions. Tongue does not deviate from midline.      Palate: No mass and lesions.      Pharynx: Oropharynx is clear. Uvula midline.   Eyes:      General: Lids are normal. Vision grossly intact.       Extraocular Movements: Extraocular movements intact.      Conjunctiva/sclera: Conjunctivae normal.      Pupils: Pupils are equal, round, and reactive to light.   Neck:      Musculoskeletal: Full passive range of motion without pain, normal range of motion and neck supple.      Thyroid: No thyroid mass, thyromegaly or thyroid tenderness.      Trachea: Trachea normal.   Cardiovascular:      Rate and Rhythm: Normal rate and regular rhythm.      Pulses: Normal pulses.      Heart sounds: Normal heart sounds, S1 normal and S2 normal.   Pulmonary:      Effort: Pulmonary effort is normal.      Breath sounds: Normal breath sounds and air entry.   Abdominal:      General: Abdomen is flat. Bowel sounds are normal.      Palpations: Abdomen is soft.      Tenderness: There is no abdominal tenderness.   Musculoskeletal: Normal range of motion.   Lymphadenopathy:      Head:      Right side of head: No submental or submandibular adenopathy.      Left side of head: No submental or submandibular adenopathy.      Cervical: No cervical adenopathy.      Upper Body:      Right upper body: No supraclavicular adenopathy.      Left upper body: No supraclavicular adenopathy.   Skin:     General: Skin is warm and moist.      Capillary Refill: Capillary refill takes less than 2 seconds.      Findings: No rash.      Nails: There is no clubbing.     Neurological:      General: No focal deficit present.      Mental Status: She is alert.      Cranial Nerves: Cranial nerves are intact.      Sensory: Sensation is intact.      Motor: Motor function is intact.      Deep Tendon Reflexes: Reflexes are normal and symmetric.   Psychiatric:         Mood and Affect: Mood and affect normal.         Behavior: Behavior normal. Behavior is cooperative.            Assessment/Plan:       1. Physical exam  - TSH, Abn Reflex to Free T4, Serum; Future  - CBC without differential; Future  - Comprehensive metabolic panel; Future  - Lipid panel; Future  - Hemoglobin  A1C; Future    2. Measles, mumps, rubella (MMR) vaccination status unknown  - Rubeola antibody IgG; Future  - Mumps antibody, IgG; Future  - Rubella antibody, IgG; Future    3. Family history of diabetes mellitus type II  - Hemoglobin A1C; Future    4. Essential hypertension  Chronic, needs refill  - lisinopril (ZESTRIL) 20 MG tablet; Take 1 tablet (20 mg total) by mouth daily  Dispense: 90 tablet; Refill: 0  Forms for employer completed    Health Maintenance:  High BMI follow-up: Encouragement to Exercise.  Recommend optimizing low carbohydrate diet efforts and obtaining at least 150 minutes of aerobic exercise per week.  Recommend drinking at least 60-80 ounces of water per day.  Recommend optimizing low sodium diet measures ( less than 2 grams of sodium in the diet per day ).  Recommend obtaining a Shingles vaccination.  Previous lab results reviewed  Immunizations UTD. Vision screening UTD. Dental Screening UTD. Colonoscopy is UTD. Mammogram screening is UTD. Cervical cancer screening is UTD.    Counseling/Anticipatory Guidance (40-64): Nutrition, physical activity, healthy weight, injury prevention, misuse of tobacco, alcohol and drugs, sexual behavior and STDs, contraception, dental health, mental health, immunizations, screenings.      Return in about 1 year (around 08/13/2020) for Physical, and as needed for acute concerns.    Leticia Clas, FNP

## 2019-08-19 ENCOUNTER — Ambulatory Visit (INDEPENDENT_AMBULATORY_CARE_PROVIDER_SITE_OTHER): Payer: PRIVATE HEALTH INSURANCE | Admitting: Family Medicine

## 2019-08-19 ENCOUNTER — Other Ambulatory Visit: Payer: PRIVATE HEALTH INSURANCE

## 2019-08-21 ENCOUNTER — Other Ambulatory Visit (FREE_STANDING_LABORATORY_FACILITY): Payer: PRIVATE HEALTH INSURANCE

## 2019-08-21 DIAGNOSIS — Z833 Family history of diabetes mellitus: Secondary | ICD-10-CM

## 2019-08-21 DIAGNOSIS — Z789 Other specified health status: Secondary | ICD-10-CM

## 2019-08-21 DIAGNOSIS — Z0000 Encounter for general adult medical examination without abnormal findings: Secondary | ICD-10-CM

## 2019-08-21 LAB — HEMOLYSIS INDEX: Hemolysis Index: 7 (ref 0–18)

## 2019-08-21 LAB — COMPREHENSIVE METABOLIC PANEL
ALT: 17 U/L (ref 0–55)
AST (SGOT): 15 U/L (ref 5–34)
Albumin/Globulin Ratio: 1.1 (ref 0.9–2.2)
Albumin: 3.9 g/dL (ref 3.5–5.0)
Alkaline Phosphatase: 104 U/L (ref 37–106)
Anion Gap: 10 (ref 5.0–15.0)
BUN: 12 mg/dL (ref 7.0–19.0)
Bilirubin, Total: 0.2 mg/dL (ref 0.2–1.2)
CO2: 25 mEq/L (ref 21–29)
Calcium: 9 mg/dL (ref 8.5–10.5)
Chloride: 105 mEq/L (ref 100–111)
Creatinine: 1 mg/dL (ref 0.4–1.5)
Globulin: 3.6 g/dL (ref 2.0–3.7)
Glucose: 109 mg/dL — ABNORMAL HIGH (ref 70–100)
Potassium: 4.3 mEq/L (ref 3.5–5.1)
Protein, Total: 7.5 g/dL (ref 6.0–8.3)
Sodium: 140 mEq/L (ref 136–145)

## 2019-08-21 LAB — CBC
Absolute NRBC: 0 10*3/uL (ref 0.00–0.00)
Hematocrit: 40.6 % (ref 34.7–43.7)
Hgb: 12.4 g/dL (ref 11.4–14.8)
MCH: 28.9 pg (ref 25.1–33.5)
MCHC: 30.5 g/dL — ABNORMAL LOW (ref 31.5–35.8)
MCV: 94.6 fL (ref 78.0–96.0)
MPV: 10.8 fL (ref 8.9–12.5)
Nucleated RBC: 0 /100 WBC (ref 0.0–0.0)
Platelets: 236 10*3/uL (ref 142–346)
RBC: 4.29 10*6/uL (ref 3.90–5.10)
RDW: 13 % (ref 11–15)
WBC: 5.02 10*3/uL (ref 3.10–9.50)

## 2019-08-21 LAB — RUBELLA ANTIBODY, IGG: Rubella AB, IgG: 17.2

## 2019-08-21 LAB — LIPID PANEL
Cholesterol / HDL Ratio: 4
Cholesterol: 210 mg/dL — ABNORMAL HIGH (ref 0–199)
HDL: 53 mg/dL (ref 40–9999)
LDL Calculated: 137 mg/dL — ABNORMAL HIGH (ref 0–99)
Triglycerides: 100 mg/dL (ref 34–149)
VLDL Calculated: 20 mg/dL (ref 10–40)

## 2019-08-21 LAB — GFR: EGFR: >60

## 2019-08-21 LAB — RUBEOLA ANTIBODY IGG: Rubeola (Measles), IgG: >300

## 2019-08-21 LAB — HEMOGLOBIN A1C
Average Estimated Glucose: 119.8 mg/dL
Hemoglobin A1C: 5.8 % (ref 4.6–5.9)

## 2019-08-21 LAB — THYROID STIMULATING HORMONE (TSH), REFLEX ON ABNORMAL TO FREE T4, SERUM: TSH, Abn Reflex to Free T4, Serum: 1.17 u[IU]/mL (ref 0.35–4.94)

## 2019-08-21 LAB — MUMPS ANTIBODY, IGG: Mumps Ab, IgG: >300

## 2019-08-22 ENCOUNTER — Encounter (INDEPENDENT_AMBULATORY_CARE_PROVIDER_SITE_OTHER): Payer: Self-pay | Admitting: Family Nurse Practitioner

## 2019-10-15 ENCOUNTER — Ambulatory Visit (INDEPENDENT_AMBULATORY_CARE_PROVIDER_SITE_OTHER): Payer: PRIVATE HEALTH INSURANCE | Admitting: Family Medicine

## 2019-11-09 ENCOUNTER — Emergency Department
Admission: EM | Admit: 2019-11-09 | Discharge: 2019-11-09 | Disposition: A | Discharge status: Home / Self Care | Payer: BC Managed Care – PPO | Attending: Emergency Medicine | Admitting: Emergency Medicine

## 2019-11-09 ENCOUNTER — Other Ambulatory Visit: Payer: Self-pay

## 2019-11-09 ENCOUNTER — Emergency Department: Payer: BC Managed Care – PPO

## 2019-11-09 DIAGNOSIS — R002 Palpitations: Secondary | ICD-10-CM | POA: Insufficient documentation

## 2019-11-09 DIAGNOSIS — R000 Tachycardia, unspecified: Secondary | ICD-10-CM | POA: Diagnosis present

## 2019-11-09 DIAGNOSIS — I10 Essential (primary) hypertension: Secondary | ICD-10-CM | POA: Insufficient documentation

## 2019-11-09 LAB — COMPREHENSIVE METABOLIC PANEL
ALT: 21 U/L (ref 0–44)
AST: 20 U/L (ref 15–41)
Albumin: 3.9 g/dL (ref 3.5–5.0)
Alkaline Phosphatase: 82 U/L (ref 38–126)
Anion gap: 7 (ref 5–15)
BUN: 15 mg/dL (ref 6–20)
CO2: 27 mmol/L (ref 22–32)
Calcium: 8.9 mg/dL (ref 8.9–10.3)
Chloride: 104 mmol/L (ref 98–111)
Creatinine, Ser: 0.82 mg/dL (ref 0.44–1.00)
GFR calc Af Amer: >60 mL/min (ref >60)
GFR calc non Af Amer: >60 mL/min (ref >60)
Glucose, Bld: 118 mg/dL — ABNORMAL HIGH (ref 70–99)
Potassium: 3.8 mmol/L (ref 3.5–5.1)
Sodium: 138 mmol/L (ref 135–145)
Total Bilirubin: 0.5 mg/dL (ref 0.3–1.2)
Total Protein: 7.4 g/dL (ref 6.5–8.1)

## 2019-11-09 LAB — CBC
HCT: 37.4 % (ref 36.0–46.0)
Hemoglobin: 12 g/dL (ref 12.0–15.0)
MCH: 29.3 pg (ref 26.0–34.0)
MCHC: 32.1 g/dL (ref 30.0–36.0)
MCV: 91.4 fL (ref 80.0–100.0)
Platelets: 202 10*3/uL (ref 150–400)
RBC: 4.09 MIL/uL (ref 3.87–5.11)
RDW: 12.7 % (ref 11.5–15.5)
WBC: 5.6 10*3/uL (ref 4.0–10.5)
nRBC: 0 % (ref 0.0–0.2)

## 2019-11-09 LAB — TROPONIN I (HIGH SENSITIVITY)
Troponin I (High Sensitivity): <2 ng/L (ref <18)
Troponin I (High Sensitivity): <2 ng/L (ref <18)

## 2019-11-09 IMAGING — CR DG CHEST 2V
2 series · 2 of 2 positions shown · non-contrast
Comparison: None.

CLINICAL DATA: Racing heart. Left-sided chest pain.

EXAM:
CHEST - 2 VIEW

[chest pa]
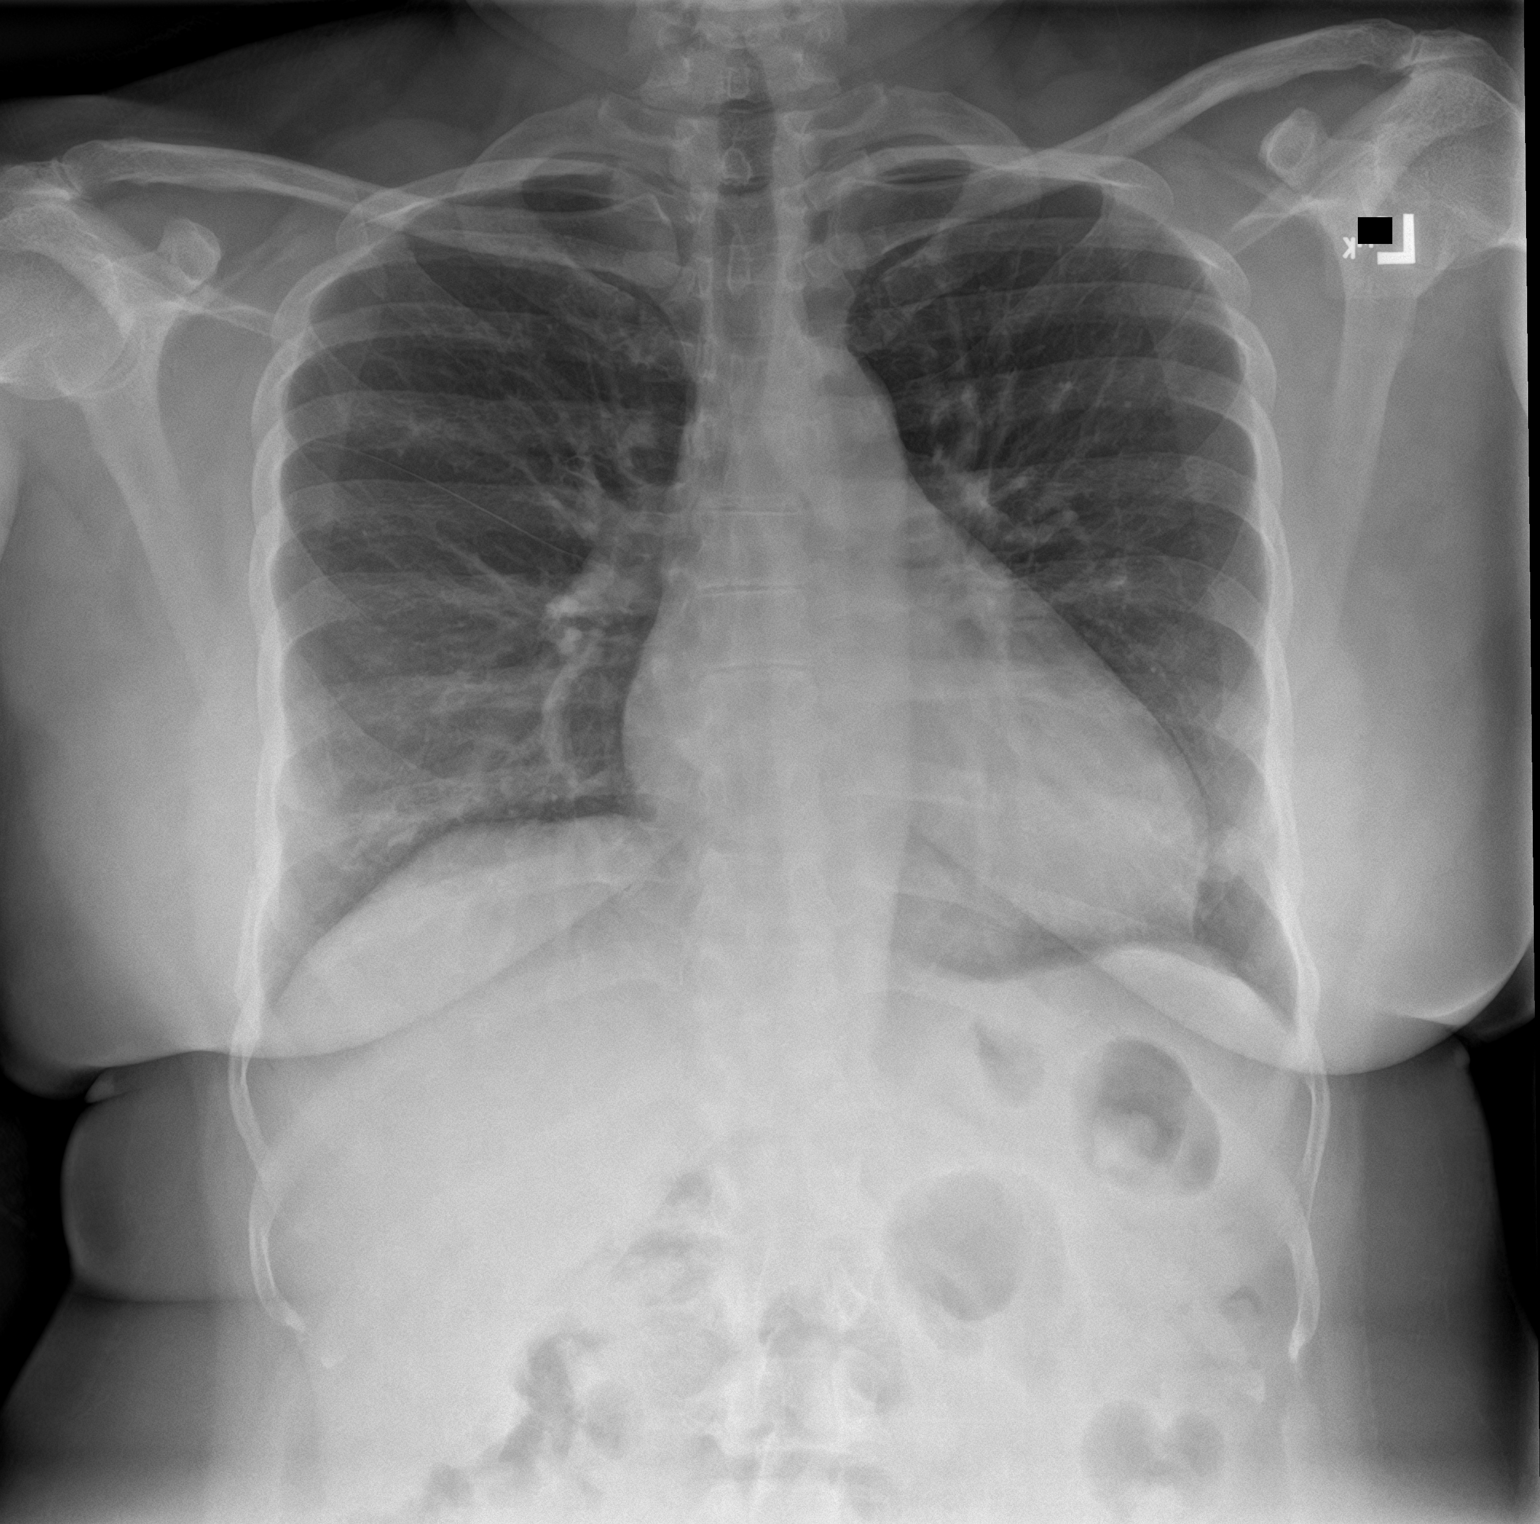

[chest lat]
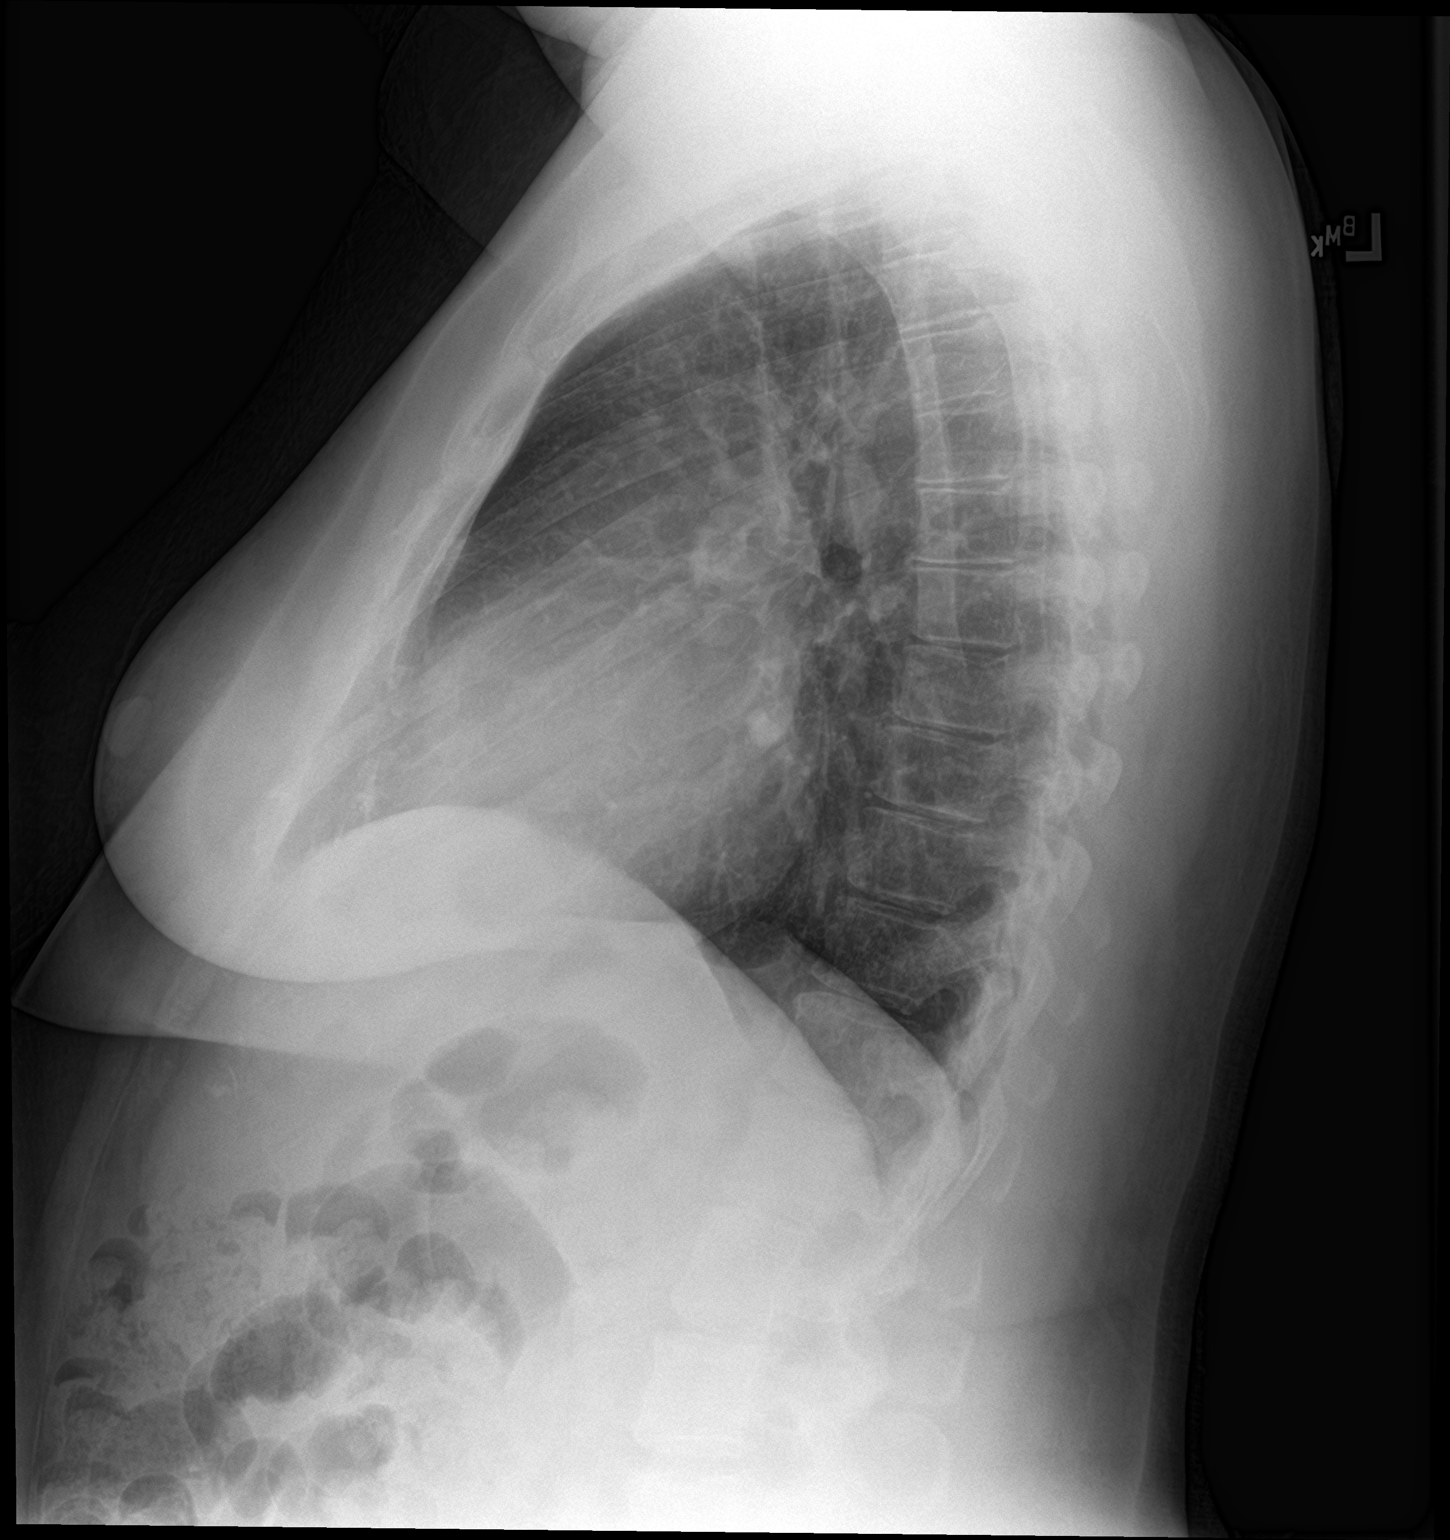

[2 of 2 positions shown; findings below may reference images not displayed]

FINDINGS: Minimal atelectasis in the bases. There is a nipple shadow on the
left. No other infiltrate, nodule, or mass. The heart, hila, and
mediastinum are normal. No overt edema. No other acute
abnormalities.
IMPRESSION: Minimal atelectasis in the bases. No other acute abnormalities.

## 2019-11-09 NOTE — ED Triage Notes (Signed)
Pt states she began to experience "racing heart" around 0300 that lasted approx 30-40 minutes. Pt states she no longer feels like her heart is racing but she now has left sided chest pain. Pt appears in no acute distress. Pt states she felt weak and shob with tachycardia.

## 2019-11-09 NOTE — ED Notes (Signed)
Pt verbalized understanding of discharge instructions. NAD at this time. 

## 2019-11-09 NOTE — ED Provider Notes (Addendum)
[facility] Emergency Department Provider Note       Time seen: ----------------------------------------- 7:56 AM on 11/09/2019 -----------------------------------------   I have reviewed the triage vital signs and the nursing notes.  HISTORY   Chief Complaint Chest Pain    HPI Mary Henson is a 54 y.o. female with a history of hypertension, bipolar disorder, iron deficiency anemia, sleep apnea who presents to the ED for heart racing.  Patient states she began to experience racing of her heart around 3:00 in the morning.  She states it lasted 30 to 40 minutes.  She states it feels back to normal now but she did have some left-sided chest pain.  She states this is happened one other time.  No past medical history on file.  There are no problems to display for this patient.  Allergies Amiloride hydrochloride dihydrate [amiloride], Clindamycin/lincomycin, Lamictal [lamotrigine], and Penicillins  Social History Social History   Tobacco Use  . Smoking status: Not on file  Substance Use Topics  . Alcohol use: Not on file  . Drug use: Not on file    Review of Systems Constitutional: Negative for fever. Cardiovascular: Positive for tachycardia, chest pain Respiratory: Negative for shortness of breath. Gastrointestinal: Negative for abdominal pain, vomiting and diarrhea. Musculoskeletal: Negative for back pain. Skin: Negative for rash. Neurological: Negative for headaches, focal weakness or numbness.  All systems negative/normal/unremarkable except as stated in the HPI  ____________________________________________   PHYSICAL EXAM:  VITAL SIGNS: ED Triage Vitals  Enc Vitals Group     BP 11/09/19 0532 (!) 152/73     Pulse Rate 11/09/19 0532 80     Resp 11/09/19 0532 20     Temp 11/09/19 0532 98.6 F (37 C)     Temp Source 11/09/19 0532 Oral     SpO2 11/09/19 0532 100 %     Weight 11/09/19 0529 201 lb (91.2 kg)     Height 11/09/19 0529 5\' 4"   (1.626 m)     Head Circumference --      Peak Flow --      Pain Score 11/09/19 0528 0     Pain Loc --      Pain Edu? --      Excl. in [place]? --     Constitutional: Alert and oriented. Well appearing and in no distress. Eyes: Conjunctivae are normal. Normal extraocular movements. Cardiovascular: Normal rate, regular rhythm. No murmurs, rubs, or gallops. Respiratory: Normal respiratory effort without tachypnea nor retractions. Breath sounds are clear and equal bilaterally. No wheezes/rales/rhonchi. Gastrointestinal: Soft and nontender. Normal bowel sounds Musculoskeletal: Nontender with normal range of motion in extremities. No lower extremity tenderness nor edema. Neurologic:  Normal speech and language. No gross focal neurologic deficits are appreciated.  Skin:  Skin is warm, dry and intact. No rash noted. Psychiatric: Mood and affect are normal. Speech and behavior are normal.  ____________________________________________  EKG: Interpreted by me.  Sinus rhythm with a rate of 83 bpm, normal PR interval, normal QRS, normal QT Repeat EKG interpreted by me, sinus rhythm with rate of 70 bpm, borderline long QT, normal axis. ____________________________________________  ED COURSE:  As part of my medical decision making, I reviewed the following data within the [place] History obtained from family if available, nursing notes, old chart and ekg, as well as notes from prior ED visits. Patient presented for tachycardia, we will assess with labs and imaging as indicated at this time.   Procedures  Mary Henson  was evaluated in Emergency Department on 11/09/2019 for the symptoms described in the history of present illness. She was evaluated in the context of the global COVID-19 pandemic, which necessitated consideration that the patient might be at risk for infection with the SARS-CoV-2 virus that causes COVID-19. Institutional protocols and algorithms that pertain to the  evaluation of patients at risk for COVID-19 are in a state of rapid change based on information released by regulatory bodies including the CDC and federal and state organizations. These policies and algorithms were followed during the patient's care in the ED.  ____________________________________________   LABS (pertinent positives/negatives)  Labs Reviewed  COMPREHENSIVE METABOLIC PANEL - Abnormal; Notable for the following components:      Result Value   Glucose, Bld 118 (*)    All other components within normal limits  CBC  TROPONIN I (HIGH SENSITIVITY)  TROPONIN I (HIGH SENSITIVITY)    RADIOLOGY  Chest x-Huneke IMPRESSION: Minimal atelectasis in the bases. No other acute abnormalities.  ____________________________________________   DIFFERENTIAL DIAGNOSIS   Arrhythmia, anxiety, unstable angina, Prinzmetal's angina, MI  FINAL ASSESSMENT AND PLAN  Palpitations   Plan: The patient had presented for palpitations with some subsequent chest pain. Patient's labs were reassuring. Patient's imaging did not reveal any acute process.  She will be referred to cardiology for likely outpatient Holter or event monitoring.   Laurence Aly, MD    Note: This note was generated in part or whole with voice recognition software. Voice recognition is usually quite accurate but there are transcription errors that can and very often do occur. I apologize for any typographical errors that were not detected and corrected.     Earleen Newport, MD 11/09/19 [id number]    Earleen Newport, MD 11/09/19 [phone]

## 2019-11-12 ENCOUNTER — Telehealth: Payer: Self-pay

## 2019-11-12 NOTE — Telephone Encounter (Signed)
Patient advised that Dr. Jacinto Reap does not have any appointments for new patients sooner. Patient advised that she can see Laverna Peace NP on 11/14/2019 at [place]. Patient reports she will call back to schedule.

## 2019-11-12 NOTE — Telephone Encounter (Signed)
Copied from [place] [id number]. Topic: Appointment Scheduling - Scheduling Inquiry for Clinic >> Nov 12, 2019  1:09 PM Mary Henson L wrote: Reason for CRM: This is a new patient that has an appointment on 12/17/19 ,with Dr Anselmo Pickler she would like to be seen before the appointment ,she says she is Bipolar and will need a refill of her medication, she is also requesting a referral to a psychiatrist .Please call her at [phone].

## 2019-11-22 ENCOUNTER — Ambulatory Visit: Payer: Self-pay | Admitting: Internal Medicine

## 2019-12-05 ENCOUNTER — Other Ambulatory Visit (INDEPENDENT_AMBULATORY_CARE_PROVIDER_SITE_OTHER): Payer: Self-pay | Admitting: Family Medicine

## 2019-12-16 ENCOUNTER — Other Ambulatory Visit (INDEPENDENT_AMBULATORY_CARE_PROVIDER_SITE_OTHER): Payer: Self-pay | Admitting: Family Nurse Practitioner

## 2019-12-16 DIAGNOSIS — I10 Essential (primary) hypertension: Secondary | ICD-10-CM

## 2019-12-17 ENCOUNTER — Ambulatory Visit: Payer: Self-pay | Admitting: Family Medicine

## 2019-12-24 ENCOUNTER — Other Ambulatory Visit: Payer: Self-pay

## 2019-12-24 ENCOUNTER — Emergency Department
Admission: EM | Admit: 2019-12-24 | Discharge: 2019-12-24 | Disposition: A | Discharge status: Home / Self Care | Payer: BC Managed Care – PPO | Attending: Emergency Medicine | Admitting: Emergency Medicine

## 2019-12-24 DIAGNOSIS — R358 Other polyuria: Secondary | ICD-10-CM | POA: Insufficient documentation

## 2019-12-24 DIAGNOSIS — R3589 Other polyuria: Secondary | ICD-10-CM

## 2019-12-24 DIAGNOSIS — R350 Frequency of micturition: Secondary | ICD-10-CM | POA: Diagnosis present

## 2019-12-24 HISTORY — DX: Sleep apnea, unspecified: G47.30

## 2019-12-24 HISTORY — DX: Essential (primary) hypertension: I10

## 2019-12-24 HISTORY — DX: Bipolar disorder, unspecified: F31.9

## 2019-12-24 LAB — BASIC METABOLIC PANEL
Anion gap: 6 (ref 5–15)
BUN: 10 mg/dL (ref 6–20)
CO2: 27 mmol/L (ref 22–32)
Calcium: 9 mg/dL (ref 8.9–10.3)
Chloride: 104 mmol/L (ref 98–111)
Creatinine, Ser: 0.84 mg/dL (ref 0.44–1.00)
GFR calc Af Amer: >60 mL/min (ref >60)
GFR calc non Af Amer: >60 mL/min (ref >60)
Glucose, Bld: 111 mg/dL — ABNORMAL HIGH (ref 70–99)
Potassium: 3.7 mmol/L (ref 3.5–5.1)
Sodium: 137 mmol/L (ref 135–145)

## 2019-12-24 LAB — URINALYSIS, COMPLETE (UACMP) WITH MICROSCOPIC
Bacteria, UA: NONE SEEN
Bilirubin Urine: NEGATIVE
Glucose, UA: NEGATIVE mg/dL
Hgb urine dipstick: NEGATIVE
Ketones, ur: NEGATIVE mg/dL
Leukocytes,Ua: NEGATIVE
Nitrite: NEGATIVE
Protein, ur: NEGATIVE mg/dL
Specific Gravity, Urine: 1.002 — ABNORMAL LOW (ref 1.005–1.030)
pH: 7 (ref 5.0–8.0)

## 2019-12-24 LAB — CBC
HCT: 37.6 % (ref 36.0–46.0)
Hemoglobin: 12 g/dL (ref 12.0–15.0)
MCH: 29.5 pg (ref 26.0–34.0)
MCHC: 31.9 g/dL (ref 30.0–36.0)
MCV: 92.4 fL (ref 80.0–100.0)
Platelets: 214 10*3/uL (ref 150–400)
RBC: 4.07 MIL/uL (ref 3.87–5.11)
RDW: 12.9 % (ref 11.5–15.5)
WBC: 7.6 10*3/uL (ref 4.0–10.5)
nRBC: 0 % (ref 0.0–0.2)

## 2019-12-24 LAB — PREGNANCY, URINE: Preg Test, Ur: NEGATIVE

## 2019-12-24 LAB — GLUCOSE, CAPILLARY: Glucose-Capillary: 105 mg/dL — ABNORMAL HIGH (ref 70–99)

## 2019-12-24 LAB — TROPONIN I (HIGH SENSITIVITY): Troponin I (High Sensitivity): 5 ng/L (ref <18)

## 2019-12-24 MED ORDER — SODIUM CHLORIDE 0.9 % IV BOLUS
1000.0000 mL | Freq: Once | INTRAVENOUS | Status: AC
  Administered 2019-12-24: 07:00:00 1000 mL via INTRAVENOUS

## 2019-12-24 NOTE — ED Notes (Signed)
E-signature not working at this time. Pt verbalized understanding of D/C instructions, prescriptions and follow up care with no further questions at this time. Pt in NAD and ambulatory at time of D/C.  

## 2019-12-24 NOTE — Discharge Instructions (Signed)
Call your Psychiatrist to discuss your ER visit. Some of your medications can cause high blood sugar and make you pee more often.  If your urine is clear, you are likely hydrated and do not need to continue drinking water to match your urine output.

## 2019-12-24 NOTE — ED Triage Notes (Signed)
Pt arrives to ED from home via POV with c/c of excessive urination and intermittent dizziness. Pt reports Sx onset at approx 2200 last night. Last time she experienced these Sx was when she was having a reaction to HCTZ last year. She was switched to atenolol and lisinopril. Pt A&Ox4, NAD, no other Sx at this time.

## 2019-12-24 NOTE — ED Provider Notes (Signed)
[facility] Emergency Department Provider Note  ____________________________________________   First MD Initiated Contact with Patient 12/24/19 [phone]     (approximate)  I have reviewed the triage vital signs and the nursing notes.   HISTORY  Chief Complaint Polyuria and Dizziness    HPI Mary Henson is a 54 y.o. female  With h/o bipolar disorder, HTN, sleep apnea, here with increased urination. Pt reports that starting last night, she felt like she was urinating more frequently than usual. She says that whatever she drank ran "right through" her, causing high volume clear urination. She began to feel dehydrated so drank more fluid and reports some mild lightheadedness with standing. No syncope. No chest pain. She states her sx feel similar to when she had an adverse reaction to HCTZ in the past, though she is not currently on any other diuretics. No recent med changes. No OTC meds. No current pain. No headache. No other complaints.        Past Medical History:  Diagnosis Date  . Bipolar disorder ([place])   . Hypertension   . Sleep apnea     There are no problems to display for this patient.     Prior to Admission medications   Not on File    Allergies Amiloride hydrochloride dihydrate [amiloride], Clindamycin/lincomycin, Lamictal [lamotrigine], and Penicillins  No family history on file.  Social History Social History   Tobacco Use  . Smoking status: Not on file  Substance Use Topics  . Alcohol use: Not on file  . Drug use: Not on file    Review of Systems  Review of Systems  Constitutional: Positive for fatigue. Negative for fever.  HENT: Negative for congestion and sore throat.   Eyes: Negative for visual disturbance.  Respiratory: Negative for cough and shortness of breath.   Cardiovascular: Negative for chest pain.  Gastrointestinal: Negative for abdominal pain, diarrhea, nausea and vomiting.  Endocrine: Positive for polydipsia  and polyuria.  Genitourinary: Negative for flank pain.  Musculoskeletal: Negative for back pain and neck pain.  Skin: Negative for rash and wound.  Neurological: Negative for weakness.     ____________________________________________  PHYSICAL EXAM:      VITAL SIGNS: ED Triage Vitals  Enc Vitals Group     BP 12/24/19 0516 (!) 169/83     Pulse Rate 12/24/19 0516 82     Resp 12/24/19 0516 18     Temp 12/24/19 0516 98.6 F (37 C)     Temp Source 12/24/19 0516 Oral     SpO2 12/24/19 0516 98 %     Weight 12/24/19 0439 210 lb (95.3 kg)     Height 12/24/19 0439 5\' 5"  (1.651 m)     Head Circumference --      Peak Flow --      Pain Score 12/24/19 0439 0     Pain Loc --      Pain Edu? --      Excl. in [place]? --      Physical Exam Vitals and nursing note reviewed.  Constitutional:      General: She is not in acute distress.    Appearance: She is well-developed.  HENT:     Head: Normocephalic and atraumatic.  Eyes:     Conjunctiva/sclera: Conjunctivae normal.  Cardiovascular:     Rate and Rhythm: Normal rate and regular rhythm.     Heart sounds: Normal heart sounds. No murmur. No friction rub.  Pulmonary:     Effort:  Pulmonary effort is normal. No respiratory distress.     Breath sounds: Normal breath sounds. No wheezing or rales.  Abdominal:     General: There is no distension.     Palpations: Abdomen is soft.     Tenderness: There is no abdominal tenderness.  Musculoskeletal:     Cervical back: Neck supple.  Skin:    General: Skin is warm.     Capillary Refill: Capillary refill takes less than 2 seconds.  Neurological:     Mental Status: She is alert and oriented to person, place, and time.     Motor: No abnormal muscle tone.       ____________________________________________   LABS (all labs ordered are listed, but only abnormal results are displayed)  Labs Reviewed  BASIC METABOLIC PANEL - Abnormal; Notable for the following components:      Result Value    Glucose, Bld 111 (*)    All other components within normal limits  URINALYSIS, COMPLETE (UACMP) WITH MICROSCOPIC - Abnormal; Notable for the following components:   Color, Urine COLORLESS (*)    APPearance CLEAR (*)    Specific Gravity, Urine 1.002 (*)    All other components within normal limits  GLUCOSE, CAPILLARY - Abnormal; Notable for the following components:   Glucose-Capillary 105 (*)    All other components within normal limits  CBC  PREGNANCY, URINE  CBG MONITORING, ED  CBG MONITORING, ED  TROPONIN I (HIGH SENSITIVITY)    ____________________________________________  EKG: Normal sinus rhythm, VR 85. PR 164, QRS 74, QTc 476. No acute ST elevations or depressions. Normal axis. No ischemia. ________________________________________  RADIOLOGY All imaging, including plain films, CT scans, and ultrasounds, independently reviewed by me, and interpretations confirmed via formal radiology reads.  ED MD interpretation:   None  Official radiology report(s): No results found.  ____________________________________________  PROCEDURES   Procedure(s) performed (including Critical Care):  Procedures  ____________________________________________  INITIAL IMPRESSION / MDM / [place] / ED COURSE  As part of my medical decision making, I reviewed the following data within the [place] notes reviewed and incorporated, Old chart reviewed, Notes from prior ED visits, and [place] Controlled Substance Database       *[name] was evaluated in Emergency Department on 12/24/2019 for the symptoms described in the history of present illness. She was evaluated in the context of the global COVID-19 pandemic, which necessitated consideration that the patient might be at risk for infection with the SARS-CoV-2 virus that causes COVID-19. Institutional protocols and algorithms that pertain to the evaluation of patients at risk for COVID-19 are in a state of  rapid change based on information released by regulatory bodies including the CDC and federal and state organizations. These policies and algorithms were followed during the patient's care in the ED.  Some ED evaluations and interventions may be delayed as a result of limited staffing during the pandemic.*     Medical Decision Making:  54 yo F with h/o bipolar disorder, HTN, here with reported polyuria and lightheadedness. Pt is overall very well appearing and in NAD. Labs, imaging reviewed. Etiology for her subjective symptoms is somewhat unclear, though primary suspicion is that she may have mild undiagnosed T2DM wit hyperglycemia based on her habitus and elevated fasting sugars here. No signs of DKA. Her sodium is normal. Urine does appear dilute but no signs of UTI. She does take multiple psychiatric meds which could be contributing to a DI or psychogenic polydipsia.  As mentioned, however, Na is normal and she is otherwise well appearing.  I discussed that this could be due to some of her psych meds (either related to DI/psychogenic polydipsia or possible metabolic syndrome with hyperglycemia), and that she should contact her psychiatrist and PCP for further evaluation. Otherwise, no apparent emergent medical condition or need for admission at this time. Her reported dizziness is more so lightheadedness with standing, and she is not significantly orthostatic here. She has no focal neurological deficits or signs of cerebellar abnormality or stroke.  ____________________________________________  FINAL CLINICAL IMPRESSION(S) / ED DIAGNOSES  Final diagnoses:  Polyuria     MEDICATIONS GIVEN DURING THIS VISIT:  Medications  sodium chloride 0.9 % bolus 1,000 mL (0 mLs Intravenous Stopped 12/24/19 0845)     ED Discharge Orders    None       Note:  This document was prepared using Dragon voice recognition software and may include unintentional dictation errors.   Duffy Bruce,  MD 12/24/19 [phone]

## 2020-01-01 ENCOUNTER — Other Ambulatory Visit (INDEPENDENT_AMBULATORY_CARE_PROVIDER_SITE_OTHER): Payer: Self-pay | Admitting: Internal Medicine

## 2020-02-10 ENCOUNTER — Other Ambulatory Visit (INDEPENDENT_AMBULATORY_CARE_PROVIDER_SITE_OTHER): Payer: Self-pay | Admitting: Internal Medicine

## 2020-02-10 DIAGNOSIS — I10 Essential (primary) hypertension: Secondary | ICD-10-CM

## 2020-02-10 NOTE — Telephone Encounter (Signed)
Last filled December 2020.   Last o/v December 2020 for PE.  Patient does not have an upcoming appointment.  She will be due back in 07/2020 for next PE.  Queued up 90 with 1 refill.

## 2020-02-10 NOTE — Telephone Encounter (Signed)
Refill sent.

## 2020-03-18 ENCOUNTER — Other Ambulatory Visit: Payer: Self-pay

## 2020-03-18 ENCOUNTER — Emergency Department: Payer: BC Managed Care – PPO

## 2020-03-18 ENCOUNTER — Emergency Department
Admission: EM | Admit: 2020-03-18 | Discharge: 2020-03-18 | Disposition: A | Discharge status: Home / Self Care | Payer: BC Managed Care – PPO | Attending: Emergency Medicine | Admitting: Emergency Medicine

## 2020-03-18 ENCOUNTER — Encounter: Payer: Self-pay | Admitting: Emergency Medicine

## 2020-03-18 DIAGNOSIS — R0602 Shortness of breath: Secondary | ICD-10-CM | POA: Diagnosis present

## 2020-03-18 DIAGNOSIS — Z880 Allergy status to penicillin: Secondary | ICD-10-CM | POA: Insufficient documentation

## 2020-03-18 DIAGNOSIS — Z79899 Other long term (current) drug therapy: Secondary | ICD-10-CM | POA: Diagnosis not present

## 2020-03-18 DIAGNOSIS — G4730 Sleep apnea, unspecified: Secondary | ICD-10-CM | POA: Diagnosis not present

## 2020-03-18 DIAGNOSIS — I10 Essential (primary) hypertension: Secondary | ICD-10-CM | POA: Insufficient documentation

## 2020-03-18 LAB — CBC
HCT: 39.2 % (ref 36.0–46.0)
Hemoglobin: 12.6 g/dL (ref 12.0–15.0)
MCH: 29 pg (ref 26.0–34.0)
MCHC: 32.1 g/dL (ref 30.0–36.0)
MCV: 90.3 fL (ref 80.0–100.0)
Platelets: 206 10*3/uL (ref 150–400)
RBC: 4.34 MIL/uL (ref 3.87–5.11)
RDW: 12.3 % (ref 11.5–15.5)
WBC: 7 10*3/uL (ref 4.0–10.5)
nRBC: 0 % (ref 0.0–0.2)

## 2020-03-18 LAB — URINALYSIS, COMPLETE (UACMP) WITH MICROSCOPIC
Bilirubin Urine: NEGATIVE
Glucose, UA: NEGATIVE mg/dL
Hgb urine dipstick: NEGATIVE
Ketones, ur: NEGATIVE mg/dL
Leukocytes,Ua: NEGATIVE
Nitrite: NEGATIVE
Protein, ur: NEGATIVE mg/dL
Specific Gravity, Urine: 1.003 — ABNORMAL LOW (ref 1.005–1.030)
pH: 7 (ref 5.0–8.0)

## 2020-03-18 LAB — BASIC METABOLIC PANEL
Anion gap: 12 (ref 5–15)
BUN: 12 mg/dL (ref 6–20)
CO2: 26 mmol/L (ref 22–32)
Calcium: 9.1 mg/dL (ref 8.9–10.3)
Chloride: 99 mmol/L (ref 98–111)
Creatinine, Ser: 0.9 mg/dL (ref 0.44–1.00)
GFR calc Af Amer: >60 mL/min (ref >60)
GFR calc non Af Amer: >60 mL/min (ref >60)
Glucose, Bld: 117 mg/dL — ABNORMAL HIGH (ref 70–99)
Potassium: 4 mmol/L (ref 3.5–5.1)
Sodium: 137 mmol/L (ref 135–145)

## 2020-03-18 LAB — POCT PREGNANCY, URINE: Preg Test, Ur: NEGATIVE

## 2020-03-18 LAB — TROPONIN I (HIGH SENSITIVITY): Troponin I (High Sensitivity): 4 ng/L (ref <18)

## 2020-03-18 IMAGING — CR DG CHEST 2V
1 series · 2 of 2 positions shown · non-contrast
Comparison: [DATE]

CLINICAL DATA: Chest pain

EXAM:
CHEST - 2 VIEW

[Series 1: dg chest 2 view · 0.14mm/px · 2 of 2 slices shown]
[im 1/2]
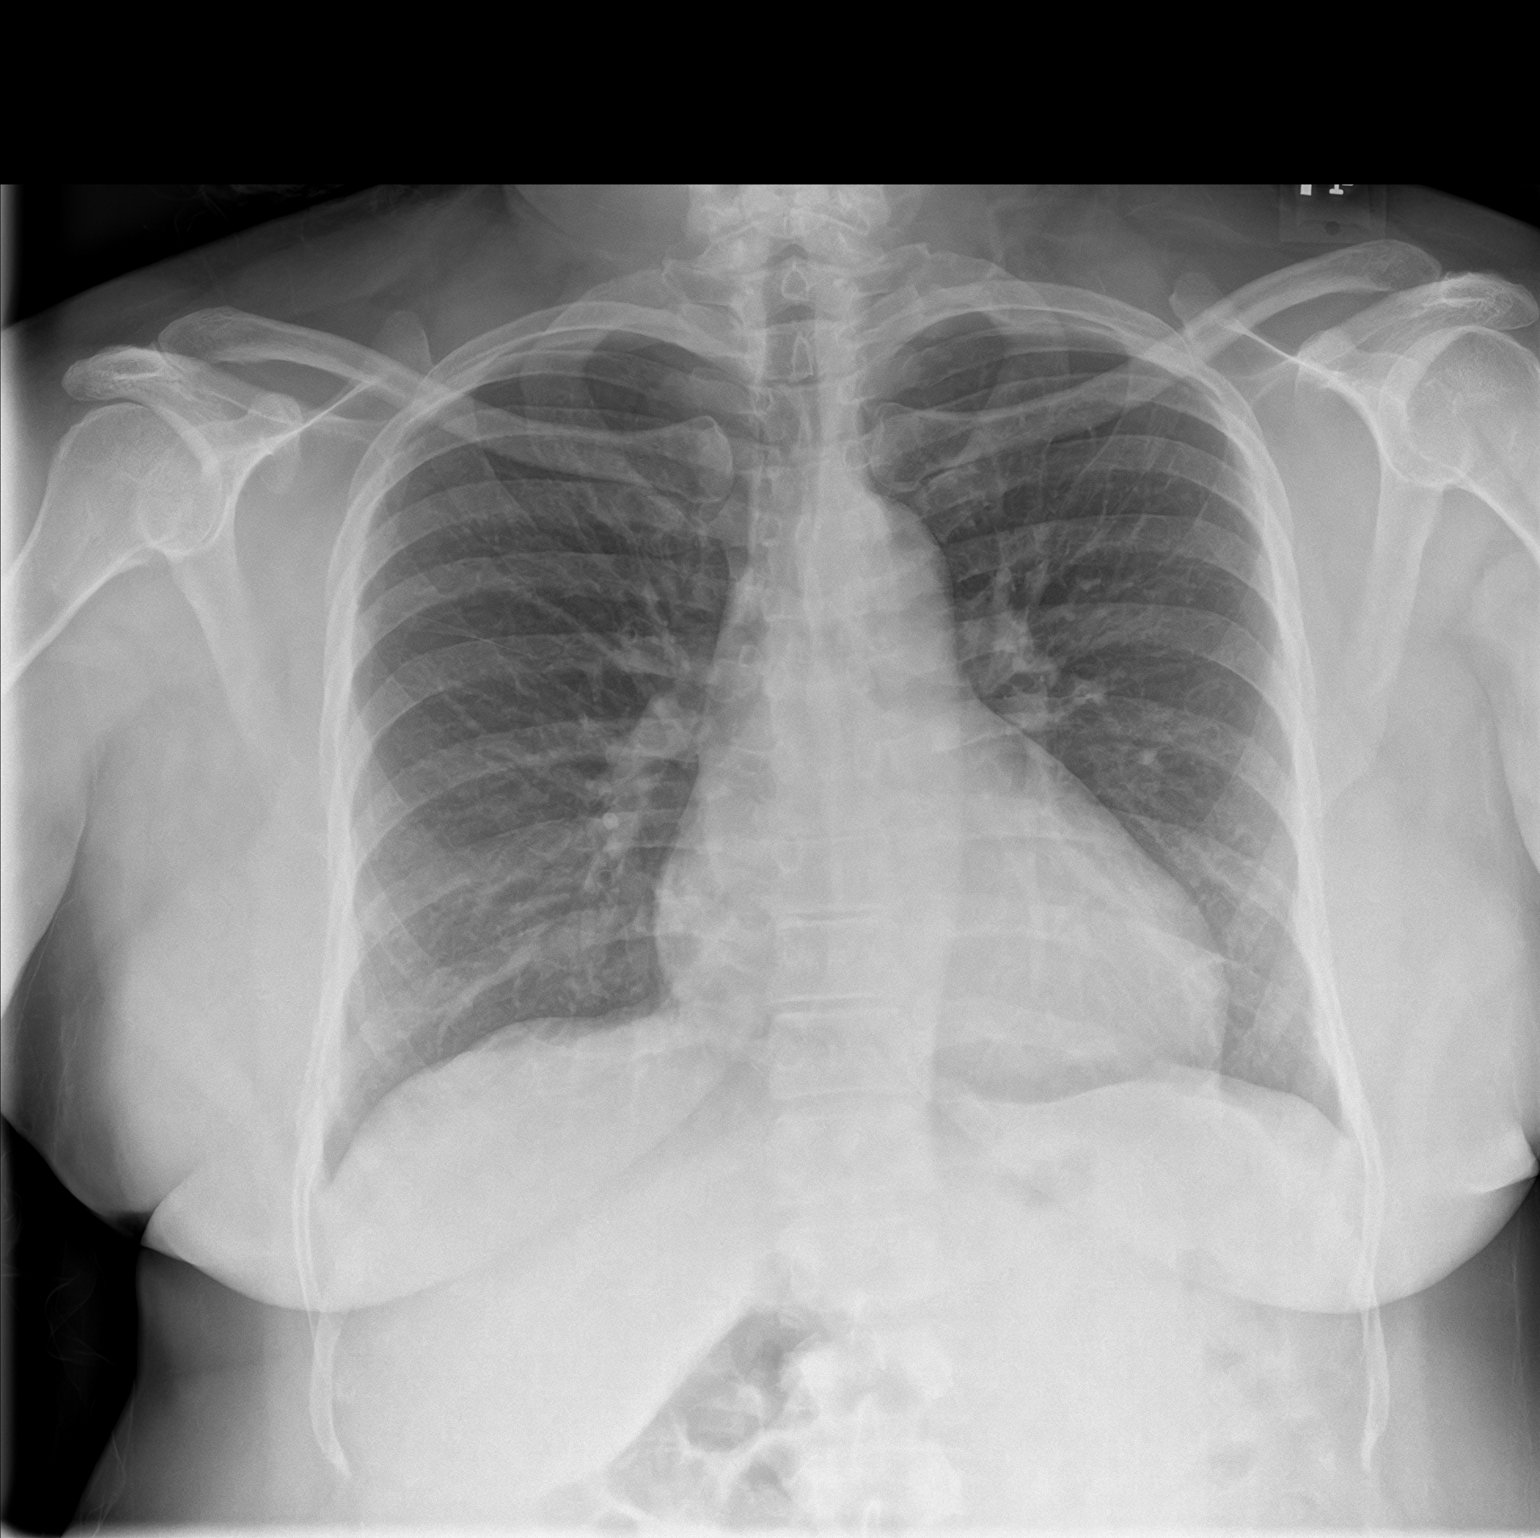
[im 2/2]
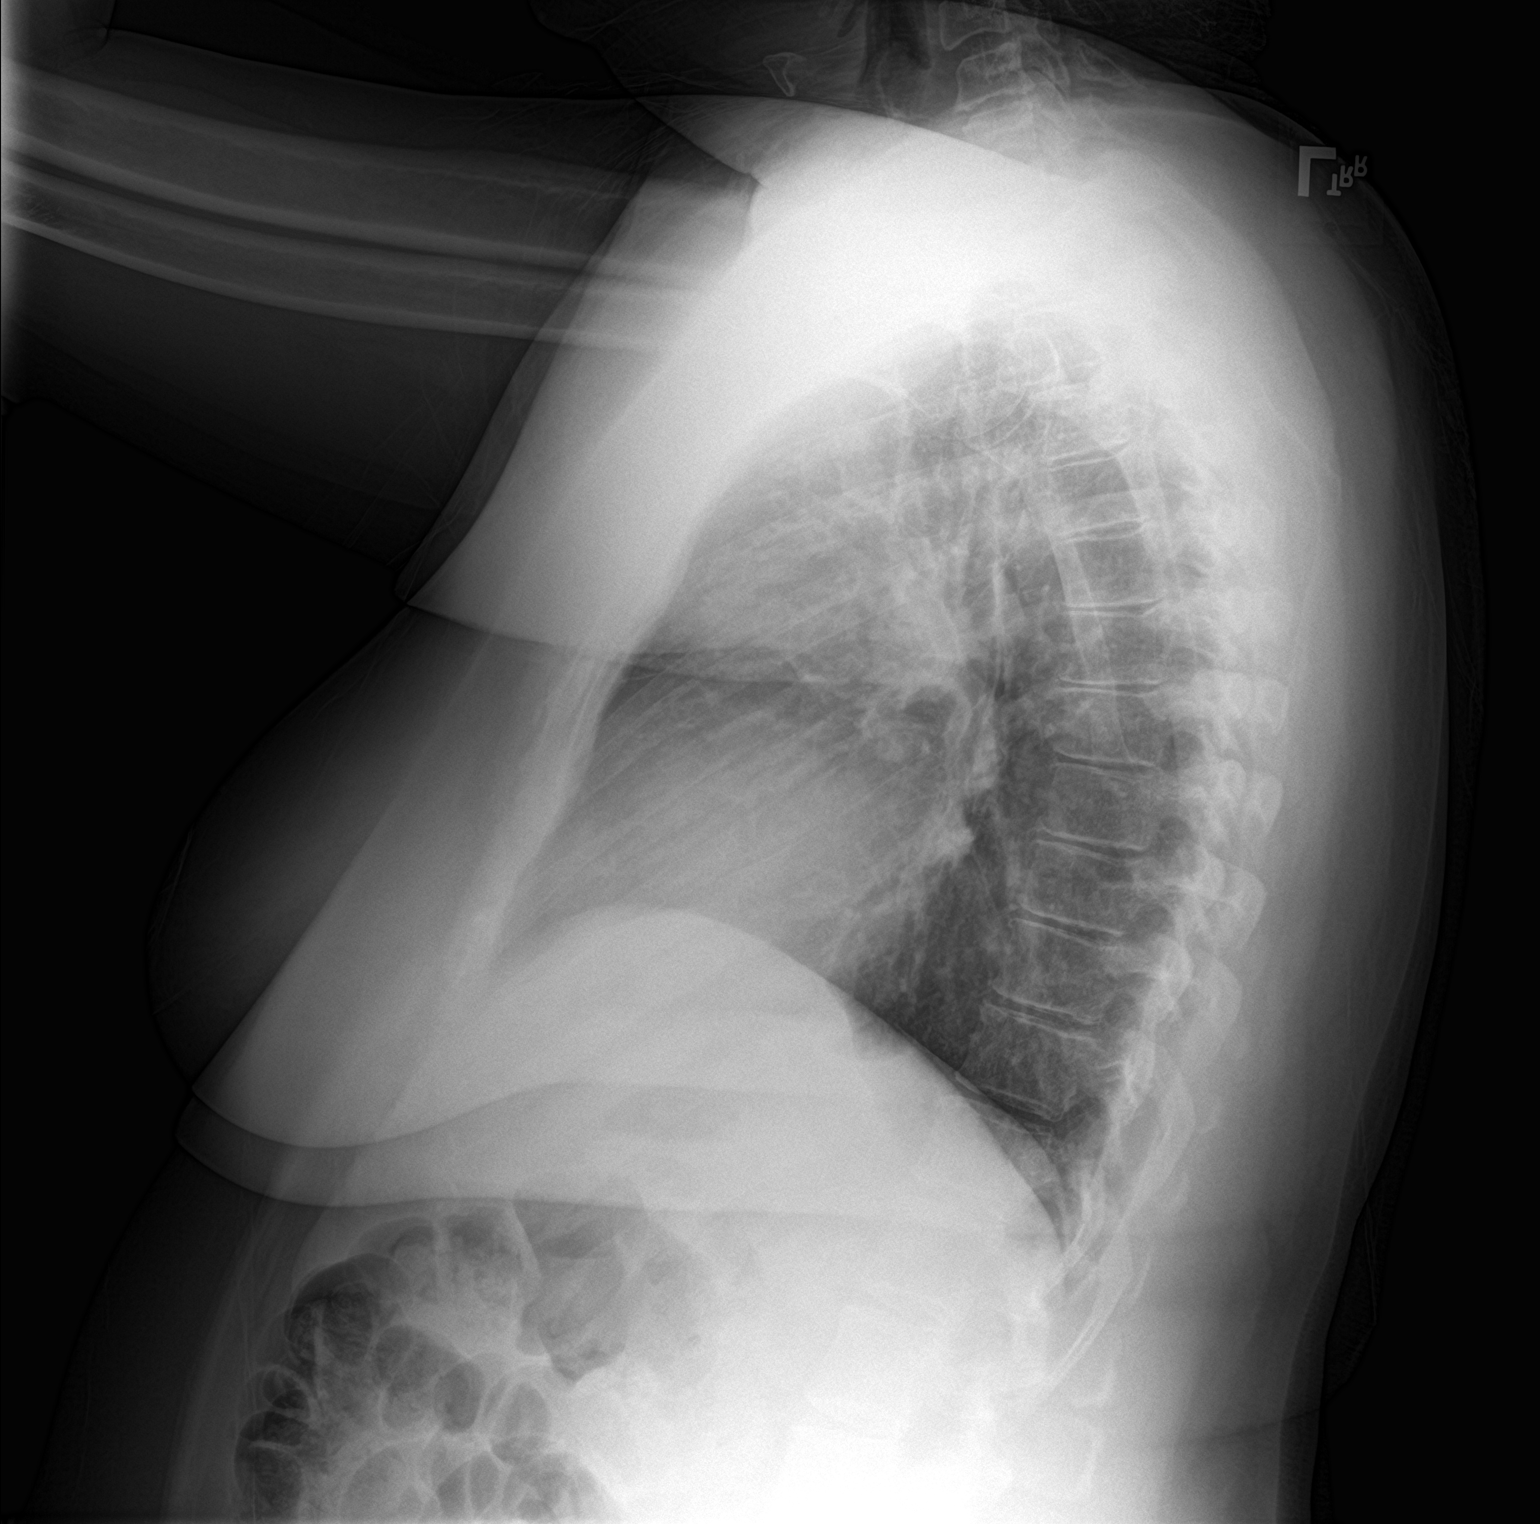

[2 of 2 positions shown; findings below may reference images not displayed]

FINDINGS: The heart size and mediastinal contours are within normal limits.
Both lungs are clear. The visualized skeletal structures are
unremarkable.
IMPRESSION: No active cardiopulmonary disease.

## 2020-03-18 NOTE — ED Provider Notes (Signed)
[facility] Emergency Department Provider Note   ____________________________________________    I have reviewed the triage vital signs and the nursing notes.   HISTORY  Chief Complaint Shortness of breath    HPI Mary Henson is a 54 y.o. female with a history of bipolar disorder, hypertension, sleep apnea who presents with complaints of being interrupted in her sleep with difficulty breathing.  Patient reports she woke up several times last night because of shortness of breath.  She reports this is when she was lying flat.  Currently she feels well and has no complaints.  She does usually wear CPAP.  Denies fevers or chills or cough.  No chest pain or pleurisy.  No nausea or vomiting or diaphoresis.   Past Medical History:  Diagnosis Date  . Bipolar disorder ([place])   . Hypertension   . Sleep apnea     There are no problems to display for this patient.   History reviewed. No pertinent surgical history.  Prior to Admission medications   Not on File     Allergies Lamotrigine, Penicillins, Clindamycin/lincomycin, Hydrochlorothiazide, Other, and Amiloride hydrochloride dihydrate [amiloride]  History reviewed. No pertinent family history.  Social History Social History   Tobacco Use  . Smoking status: Never Smoker  . Smokeless tobacco: Never Used  Vaping Use  . Vaping Use: Never used  Substance Use Topics  . Alcohol use: Not on file  . Drug use: Not on file    Review of Systems  Constitutional: No fever/chills Eyes: No visual changes.  ENT: No sore throat. Cardiovascular: Denies chest pain. Respiratory: As above Gastrointestinal: No abdominal pain.  No nausea, no vomiting.   Genitourinary: Negative for dysuria. Musculoskeletal: Negative for back pain. Skin: Negative for rash. Neurological: Negative for headaches or weakness   ____________________________________________   PHYSICAL EXAM:  VITAL SIGNS: ED Triage Vitals   Enc Vitals Group     BP 03/18/20 0215 (!) 180/103     Pulse Rate 03/18/20 0215 75     Resp 03/18/20 0215 18     Temp 03/18/20 0215 98.7 F (37.1 C)     Temp Source 03/18/20 0215 Oral     SpO2 03/18/20 0215 95 %     Weight 03/18/20 0216 (!) 95.7 kg (211 lb)     Height 03/18/20 0216 1.626 m (5\' 4" )     Head Circumference --      Peak Flow --      Pain Score 03/18/20 0215 5     Pain Loc --      Pain Edu? --      Excl. in [place]? --     Constitutional: Alert and oriented. No acute distress.  Nose: No congestion/rhinnorhea.  Cardiovascular: Normal rate, regular rhythm. Grossly normal heart sounds.  Good peripheral circulation. Respiratory: Normal respiratory effort.  No retractions. Lungs CTAB.  Musculoskeletal: No lower extremity tenderness nor edema.  Warm and well perfused Neurologic:  Normal speech and language. No gross focal neurologic deficits are appreciated.  Skin:  Skin is warm, dry and intact. No rash noted. Psychiatric: Mood and affect are normal. Speech and behavior are normal.  ____________________________________________   LABS (all labs ordered are listed, but only abnormal results are displayed)  Labs Reviewed  BASIC METABOLIC PANEL - Abnormal; Notable for the following components:      Result Value   Glucose, Bld 117 (*)    All other components within normal limits  URINALYSIS, COMPLETE (UACMP) WITH MICROSCOPIC -  Abnormal; Notable for the following components:   Color, Urine STRAW (*)    APPearance CLEAR (*)    Specific Gravity, Urine 1.003 (*)    Bacteria, UA RARE (*)    All other components within normal limits  CBC  POC URINE PREG, ED  POCT PREGNANCY, URINE  TROPONIN I (HIGH SENSITIVITY)  TROPONIN I (HIGH SENSITIVITY)   ____________________________________________  EKG  ED ECG REPORT I, Lavonia Drafts, the attending physician, personally viewed and interpreted this ECG.  Date: 03/18/2020  Rhythm: normal sinus rhythm QRS Axis:  normal Intervals: normal ST/T Wave abnormalities: normal Narrative Interpretation: no evidence of acute ischemia  ____________________________________________  RADIOLOGY  Chest x-Putzier reviewed by me, no infiltrate pneumothorax or effusion ____________________________________________   PROCEDURES  Procedure(s) performed: No  Procedures   Critical Care performed: No ____________________________________________   INITIAL IMPRESSION / ASSESSMENT AND PLAN / ED COURSE  Pertinent labs & imaging results that were available during my care of the patient were reviewed by me and considered in my medical decision making (see chart for details).  Patient well-appearing and in no acute distress.  Breathing here is normal, normal respiratory rate, normal oxygenation, clear to auscultation on exam.  Description of feeling short of breath while sleeping consistent with sleep apnea.  Discussed with her with sleeping on slightly more of an angle and discussing her PCP on adjustment of CPAP  Lab work here is overall quite reassuring, normal CBC, normal troponin, normal chemistries.  Chest x-Fluellen is clear without edema  Recommend close outpatient follow-up with PCP    ____________________________________________   FINAL CLINICAL IMPRESSION(S) / ED DIAGNOSES  Final diagnoses:  Sleep apnea, unspecified type        Note:  This document was prepared using Dragon voice recognition software and may include unintentional dictation errors.   Lavonia Drafts, MD 03/18/20 [phone]

## 2020-03-18 NOTE — ED Triage Notes (Signed)
Pt arrived via POV with c/o waking up several times during the night gasping for breath, also states she has been having urinary frequency for the last hour prior to arrival.  Pt denies any dysuria.   Pt has hx of sleep apnea wears a CPAP at night, but still continued to have gasping.  Pt denies any cough

## 2020-03-21 ENCOUNTER — Emergency Department
Admission: EM | Admit: 2020-03-21 | Discharge: 2020-03-21 | Disposition: A | Discharge status: Home / Self Care | Payer: BC Managed Care – PPO | Attending: Emergency Medicine | Admitting: Emergency Medicine

## 2020-03-21 ENCOUNTER — Encounter: Payer: Self-pay | Admitting: *Deleted

## 2020-03-21 ENCOUNTER — Other Ambulatory Visit: Payer: Self-pay

## 2020-03-21 DIAGNOSIS — R519 Headache, unspecified: Secondary | ICD-10-CM | POA: Insufficient documentation

## 2020-03-21 DIAGNOSIS — E860 Dehydration: Secondary | ICD-10-CM | POA: Diagnosis not present

## 2020-03-21 DIAGNOSIS — Z79899 Other long term (current) drug therapy: Secondary | ICD-10-CM | POA: Insufficient documentation

## 2020-03-21 DIAGNOSIS — Z880 Allergy status to penicillin: Secondary | ICD-10-CM | POA: Insufficient documentation

## 2020-03-21 DIAGNOSIS — I10 Essential (primary) hypertension: Secondary | ICD-10-CM | POA: Insufficient documentation

## 2020-03-21 LAB — COMPREHENSIVE METABOLIC PANEL
ALT: 18 U/L (ref 0–44)
AST: 16 U/L (ref 15–41)
Albumin: 3.8 g/dL (ref 3.5–5.0)
Alkaline Phosphatase: 81 U/L (ref 38–126)
Anion gap: 6 (ref 5–15)
BUN: 13 mg/dL (ref 6–20)
CO2: 28 mmol/L (ref 22–32)
Calcium: 8.6 mg/dL — ABNORMAL LOW (ref 8.9–10.3)
Chloride: 96 mmol/L — ABNORMAL LOW (ref 98–111)
Creatinine, Ser: 0.92 mg/dL (ref 0.44–1.00)
GFR calc Af Amer: >60 mL/min (ref >60)
GFR calc non Af Amer: >60 mL/min (ref >60)
Glucose, Bld: 114 mg/dL — ABNORMAL HIGH (ref 70–99)
Potassium: 4 mmol/L (ref 3.5–5.1)
Sodium: 130 mmol/L — ABNORMAL LOW (ref 135–145)
Total Bilirubin: 0.4 mg/dL (ref 0.3–1.2)
Total Protein: 7.5 g/dL (ref 6.5–8.1)

## 2020-03-21 LAB — URINALYSIS, COMPLETE (UACMP) WITH MICROSCOPIC
Bilirubin Urine: NEGATIVE
Glucose, UA: NEGATIVE mg/dL
Hgb urine dipstick: NEGATIVE
Ketones, ur: NEGATIVE mg/dL
Leukocytes,Ua: NEGATIVE
Nitrite: NEGATIVE
Protein, ur: NEGATIVE mg/dL
Specific Gravity, Urine: 1.004 — ABNORMAL LOW (ref 1.005–1.030)
pH: 8 (ref 5.0–8.0)

## 2020-03-21 LAB — CBC
HCT: 36 % (ref 36.0–46.0)
Hemoglobin: 11.6 g/dL — ABNORMAL LOW (ref 12.0–15.0)
MCH: 29.3 pg (ref 26.0–34.0)
MCHC: 32.2 g/dL (ref 30.0–36.0)
MCV: 90.9 fL (ref 80.0–100.0)
Platelets: 205 10*3/uL (ref 150–400)
RBC: 3.96 MIL/uL (ref 3.87–5.11)
RDW: 12.4 % (ref 11.5–15.5)
WBC: 6.8 10*3/uL (ref 4.0–10.5)
nRBC: 0 % (ref 0.0–0.2)

## 2020-03-21 LAB — TROPONIN I (HIGH SENSITIVITY)
Troponin I (High Sensitivity): 4 ng/L (ref <18)
Troponin I (High Sensitivity): 3 ng/L (ref <18)

## 2020-03-21 MED ORDER — SODIUM CHLORIDE 0.9 % IV BOLUS
1000.0000 mL | Freq: Once | INTRAVENOUS | Status: AC
  Administered 2020-03-21: 1000 mL via INTRAVENOUS

## 2020-03-21 NOTE — ED Notes (Addendum)
Pt reporting she is feeling weaker than when she arrived. Pt agreeable to vitals and an EKG with the new onset of weakness.   After talking with patient she stated she had a short episode of chest pain while waiting that has since subsided.

## 2020-03-21 NOTE — ED Notes (Signed)
Lab updated on add on blood work.

## 2020-03-21 NOTE — Discharge Instructions (Addendum)
Increase fluids and stay hydrated.  You may also drink fluids that contain electrolytes to prevent depletion of your potassium.  Stay out of the heat for long to prevent further dehydration.  Return to the emergency department if any severe worsening of your symptoms or urgent concerns.

## 2020-03-21 NOTE — ED Notes (Signed)
Pt given water and drinking with no difficulty.

## 2020-03-21 NOTE — ED Notes (Signed)
See triage note  States she had slight h/a and increased urination  sxs' started couple of days ago  Denies any vomiting    No fever  States she has been drinking some fluids while in lobby

## 2020-03-21 NOTE — ED Triage Notes (Signed)
Pt to ED reporting increased urination and a headache. Pt concerned for dehydration. No know hx of DM and no dysuria or fevers reported. No dizziness or lightheadedness. No NVD.

## 2020-03-21 NOTE — ED Provider Notes (Signed)
[facility] Emergency Department Provider Note   ____________________________________________   First MD Initiated Contact with Patient 03/21/20 0827     (approximate)  I have reviewed the triage vital signs and the nursing notes.   HISTORY  Chief Complaint Possible dehydration   HPI Mary Henson is a 54 y.o. female presents to the ED with concerns of being dehydrated.  Patient states that she has had a slight headache and some increased urination.  Patient denies any vomiting.  She also denies any long exposure outside in the heat.  Patient had 1 short episode of chest pain which now has subsided.  She denies any cough, congestion, sore throat, nausea, vomiting or diarrhea.  She denies any pain.       Past Medical History:  Diagnosis Date   Bipolar disorder ([place])    Hypertension    Sleep apnea     There are no problems to display for this patient.   History reviewed. No pertinent surgical history.  Prior to Admission medications   Not on File    Allergies Lamotrigine, Penicillins, Clindamycin/lincomycin, Hydrochlorothiazide, Other, and Amiloride hydrochloride dihydrate [amiloride]  History reviewed. No pertinent family history.  Social History Social History   Tobacco Use   Smoking status: Never Smoker   Smokeless tobacco: Never Used  Scientific laboratory technician Use: Never used  Substance Use Topics   Alcohol use: Not on file   Drug use: Not on file    Review of Systems Constitutional: No fever/chills Eyes: No visual changes. ENT: No sore throat. Cardiovascular: Denies chest pain. Respiratory: Denies shortness of breath.  Negative for cough. Gastrointestinal: No abdominal pain.  No nausea, no vomiting.  No diarrhea.  Genitourinary: Negative for dysuria.  Positive for frequent urination. Musculoskeletal: Negative for muscle aches or muscle cramps. Skin: Negative for rash. Neurological: Positive for headache, negative for focal  weakness or numbness. Psychiatric:  Bipolar disorder ____________________________________________   PHYSICAL EXAM:  VITAL SIGNS: ED Triage Vitals  Enc Vitals Group     BP 03/21/20 0324 (!) 159/86     Pulse Rate 03/21/20 0324 75     Resp 03/21/20 0324 16     Temp 03/21/20 0324 97.7 F (36.5 C)     Temp Source 03/21/20 0324 Oral     SpO2 03/21/20 0324 94 %     Weight 03/21/20 0325 (!) 211 lb (95.7 kg)     Height 03/21/20 0325 5\' 4"  (1.626 m)     Head Circumference --      Peak Flow --      Pain Score 03/21/20 0325 0     Pain Loc --      Pain Edu? --      Excl. in [place]? --     Constitutional: Alert and oriented. Well appearing and in no acute distress. Eyes: Conjunctivae are normal. PERRL. EOMI. Head: Atraumatic. Nose: No congestion/rhinnorhea. Mouth/Throat: Mucous membranes are moist.  Oropharynx non-erythematous. Neck: No stridor.   Cardiovascular: Normal rate, regular rhythm. Grossly normal heart sounds.  Good peripheral circulation. Respiratory: Normal respiratory effort.  No retractions. Lungs CTAB. Gastrointestinal: Soft and nontender. No distention.  No CVA tenderness. Musculoskeletal: Moves upper and lower extremities with any difficulty.  Normal gait was noted. Neurologic:  Normal speech and language. No gross focal neurologic deficits are appreciated. Skin:  Skin is warm, dry and intact. No rash noted. Psychiatric: Mood and affect are normal. Speech and behavior are normal.  ____________________________________________   LABS (all  labs ordered are listed, but only abnormal results are displayed)  Labs Reviewed  COMPREHENSIVE METABOLIC PANEL - Abnormal; Notable for the following components:      Result Value   Sodium 130 (*)    Chloride 96 (*)    Glucose, Bld 114 (*)    Calcium 8.6 (*)    All other components within normal limits  CBC - Abnormal; Notable for the following components:   Hemoglobin 11.6 (*)    All other components within normal limits   URINALYSIS, COMPLETE (UACMP) WITH MICROSCOPIC - Abnormal; Notable for the following components:   Color, Urine STRAW (*)    APPearance CLEAR (*)    Specific Gravity, Urine 1.004 (*)    Bacteria, UA RARE (*)    All other components within normal limits  TROPONIN I (HIGH SENSITIVITY)  TROPONIN I (HIGH SENSITIVITY)    PROCEDURES  Procedure(s) performed (including Critical Care):  Procedures   ____________________________________________   INITIAL IMPRESSION / ASSESSMENT AND PLAN / ED COURSE  As part of my medical decision making, I reviewed the following data within the electronic MEDICAL RECORD NUMBER Notes from prior ED visits and [place] Controlled Substance Database  54 year old female presents to the ED with symptoms of lightheadedness and feeling "dehydrated".  Patient states that this is happened to her before.  She states she had one episode of chest pain that has now resolved.  She denies any other symptoms.  She also denies any exposure outside in the heat for long periods of time.  She reports that she does not drink very much fluids yesterday.  Lab work was reviewed and patient was given a liter of normal saline and was feeling better prior to discharge.  She is encouraged to drink fluids frequently and to follow-up with her PCP if any continued problems.  ____________________________________________   FINAL CLINICAL IMPRESSION(S) / ED DIAGNOSES  Final diagnoses:  Dehydration, mild     ED Discharge Orders    None       Note:  This document was prepared using Dragon voice recognition software and may include unintentional dictation errors.    Johnn Hai, PA-C 03/21/20 1253    Vladimir Crofts, MD 03/21/20 [phone]

## 2020-03-21 NOTE — ED Notes (Signed)
Pt pulled to triage 1 by this RN. Repeat VS obtained by this RN at this time, repeat trop obtained by this RN. Pt visualized in NAD at this time, tolerating PO without difficulty. Pt provided with warm blanket by this RN.

## 2020-04-03 ENCOUNTER — Other Ambulatory Visit (INDEPENDENT_AMBULATORY_CARE_PROVIDER_SITE_OTHER): Payer: Self-pay | Admitting: Internal Medicine

## 2020-05-12 ENCOUNTER — Emergency Department: Payer: BC Managed Care – PPO

## 2020-05-12 ENCOUNTER — Emergency Department
Admission: EM | Admit: 2020-05-12 | Discharge: 2020-05-12 | Disposition: A | Discharge status: Home / Self Care | Payer: BC Managed Care – PPO | Attending: Emergency Medicine | Admitting: Emergency Medicine

## 2020-05-12 ENCOUNTER — Other Ambulatory Visit: Payer: Self-pay

## 2020-05-12 ENCOUNTER — Encounter: Payer: Self-pay | Admitting: Emergency Medicine

## 2020-05-12 DIAGNOSIS — R0789 Other chest pain: Secondary | ICD-10-CM

## 2020-05-12 DIAGNOSIS — R000 Tachycardia, unspecified: Secondary | ICD-10-CM | POA: Diagnosis present

## 2020-05-12 DIAGNOSIS — I10 Essential (primary) hypertension: Secondary | ICD-10-CM | POA: Insufficient documentation

## 2020-05-12 LAB — CBC WITH DIFFERENTIAL/PLATELET
Abs Immature Granulocytes: 0.05 10*3/uL (ref 0.00–0.07)
Basophils Absolute: 0 10*3/uL (ref 0.0–0.1)
Basophils Relative: 0 %
Eosinophils Absolute: 0.1 10*3/uL (ref 0.0–0.5)
Eosinophils Relative: 2 %
HCT: 36.8 % (ref 36.0–46.0)
Hemoglobin: 12 g/dL (ref 12.0–15.0)
Immature Granulocytes: 1 %
Lymphocytes Relative: 19 %
Lymphs Abs: 1.4 10*3/uL (ref 0.7–4.0)
MCH: 29.8 pg (ref 26.0–34.0)
MCHC: 32.6 g/dL (ref 30.0–36.0)
MCV: 91.3 fL (ref 80.0–100.0)
Monocytes Absolute: 0.7 10*3/uL (ref 0.1–1.0)
Monocytes Relative: 9 %
Neutro Abs: 5.1 10*3/uL (ref 1.7–7.7)
Neutrophils Relative %: 69 %
Platelets: 204 10*3/uL (ref 150–400)
RBC: 4.03 MIL/uL (ref 3.87–5.11)
RDW: 13.2 % (ref 11.5–15.5)
WBC: 7.3 10*3/uL (ref 4.0–10.5)
nRBC: 0 % (ref 0.0–0.2)

## 2020-05-12 LAB — COMPREHENSIVE METABOLIC PANEL
ALT: 17 U/L (ref 0–44)
AST: 17 U/L (ref 15–41)
Albumin: 3.8 g/dL (ref 3.5–5.0)
Alkaline Phosphatase: 92 U/L (ref 38–126)
Anion gap: 10 (ref 5–15)
BUN: 10 mg/dL (ref 6–20)
CO2: 23 mmol/L (ref 22–32)
Calcium: 9.3 mg/dL (ref 8.9–10.3)
Chloride: 103 mmol/L (ref 98–111)
Creatinine, Ser: 0.72 mg/dL (ref 0.44–1.00)
GFR calc Af Amer: >60 mL/min (ref >60)
GFR calc non Af Amer: >60 mL/min (ref >60)
Glucose, Bld: 139 mg/dL — ABNORMAL HIGH (ref 70–99)
Potassium: 4.1 mmol/L (ref 3.5–5.1)
Sodium: 136 mmol/L (ref 135–145)
Total Bilirubin: 0.4 mg/dL (ref 0.3–1.2)
Total Protein: 7.3 g/dL (ref 6.5–8.1)

## 2020-05-12 LAB — TROPONIN I (HIGH SENSITIVITY)
Troponin I (High Sensitivity): 8 ng/L (ref <18)
Troponin I (High Sensitivity): 7 ng/L (ref <18)

## 2020-05-12 IMAGING — CR DG CHEST 2V
1 series · 2 of 2 positions shown · non-contrast
Comparison: [DATE]

CLINICAL DATA: Chest pain and tachycardia

EXAM:
CHEST - 2 VIEW

[Series 1: dg chest 2 view · 0.14mm/px · 2 of 2 slices shown]
[im 1/2]
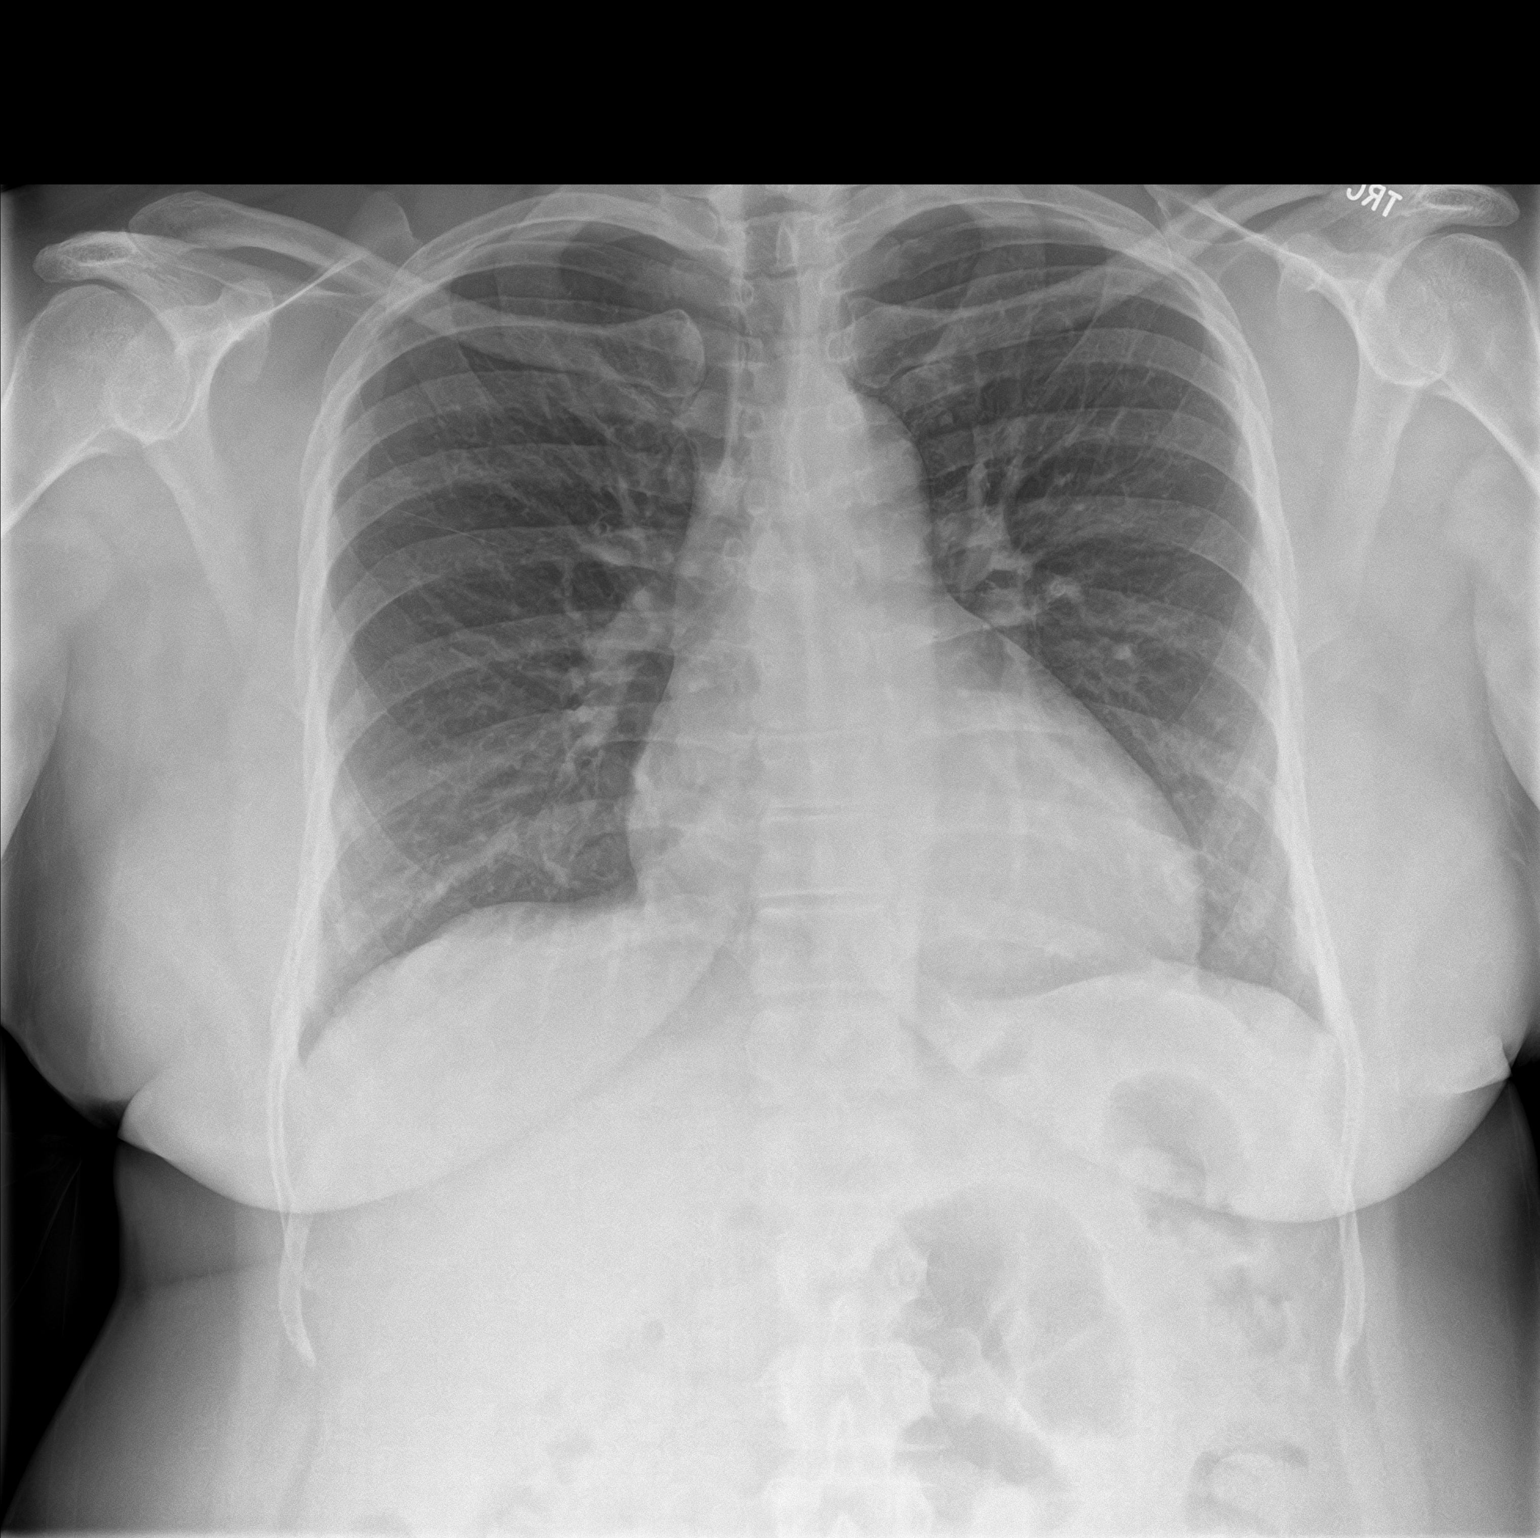
[im 2/2]
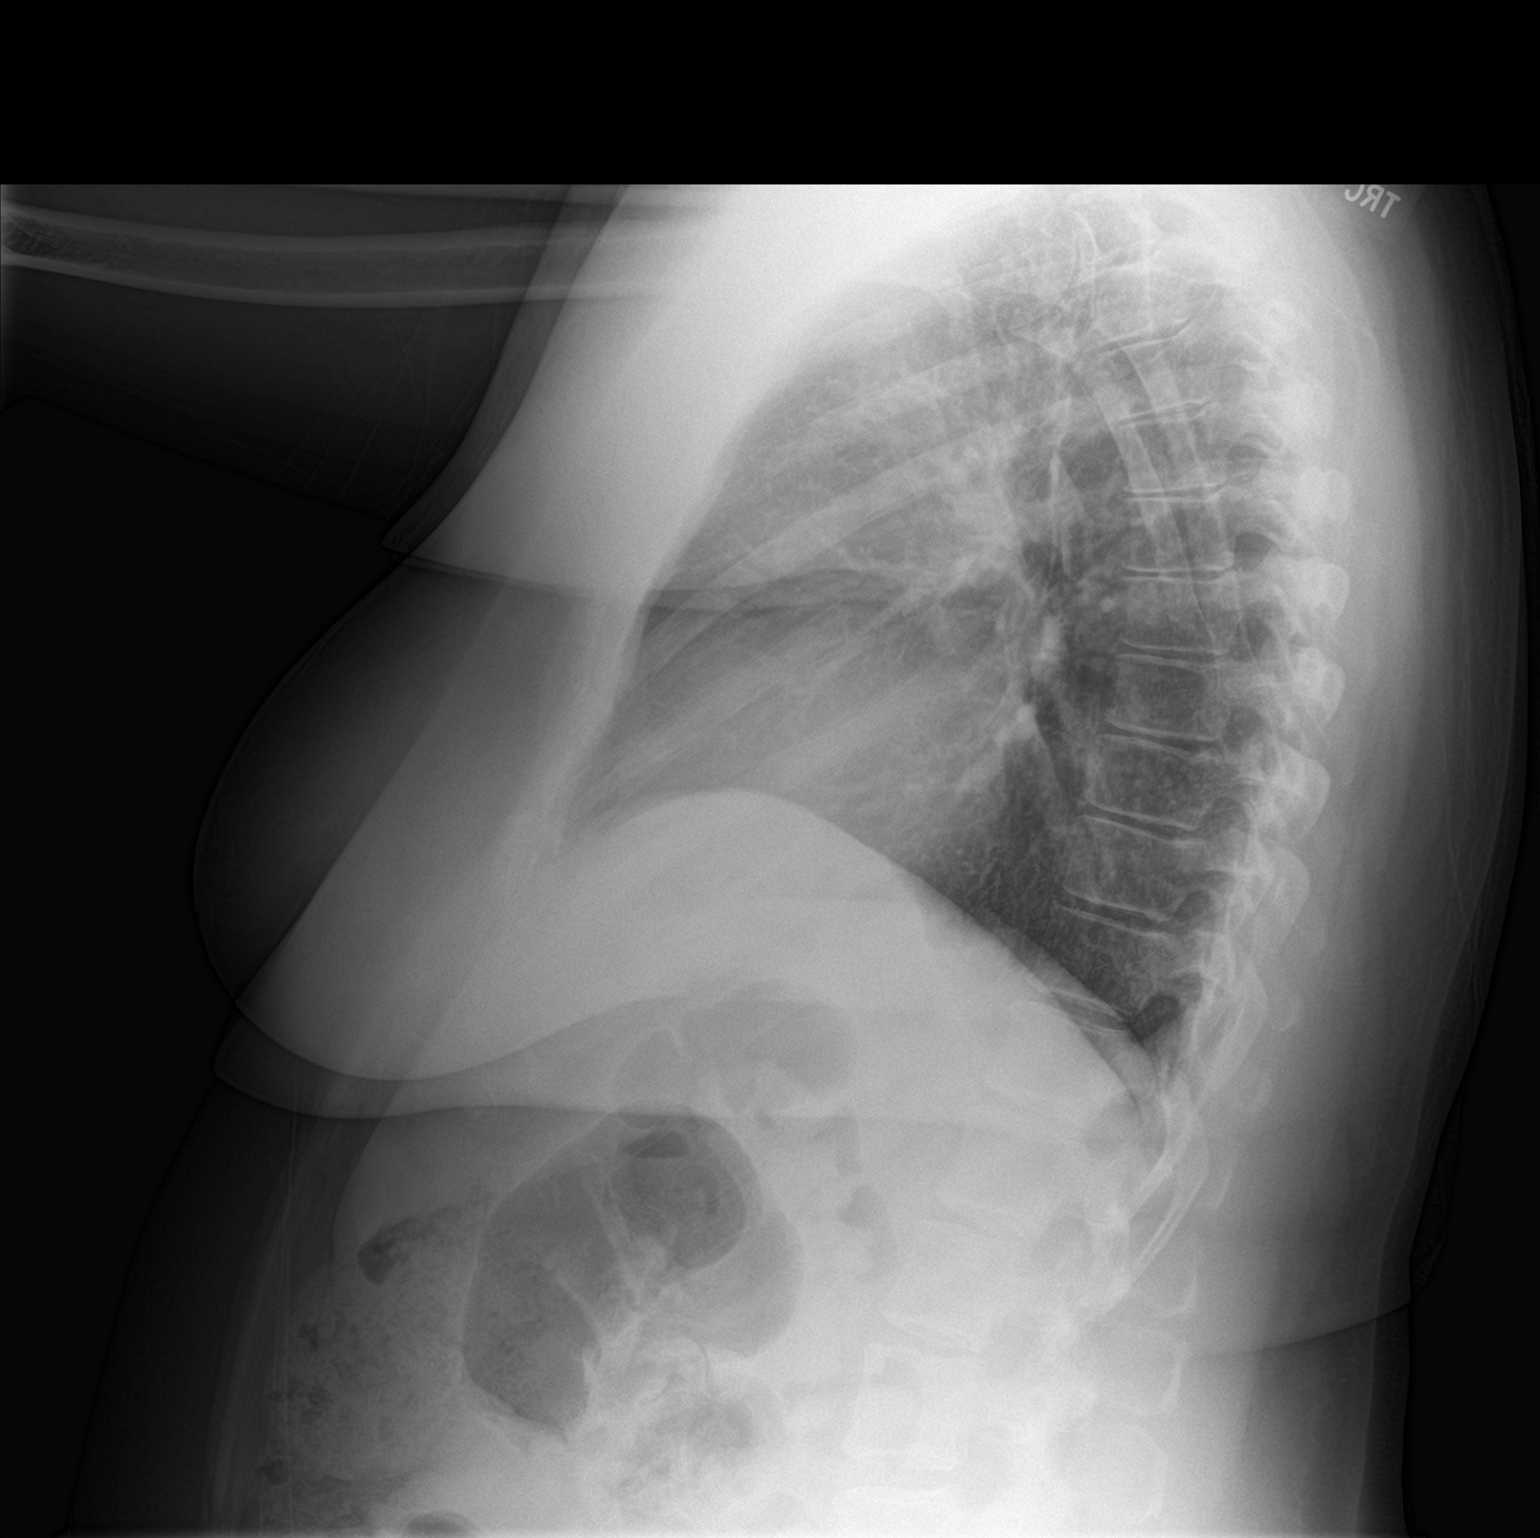

[2 of 2 positions shown; findings below may reference images not displayed]

FINDINGS: Cardiac shadow is mildly prominent but stable. The lungs are well
aerated without focal infiltrate or sizable effusion. No acute bony
abnormality is noted.
IMPRESSION: No active cardiopulmonary disease.

## 2020-05-12 NOTE — ED Provider Notes (Signed)
[facility] Emergency Department Provider Note  ____________________________________________  Time seen: Approximately 8:49 AM  I have reviewed the triage vital signs and the nursing notes.   HISTORY  Chief Complaint Tachycardia    HPI Mary Henson is a 54 y.o. female with a history of hypertension sleep apnea and bipolar disorder who comes the ED complaining of waking up this morning with her heart racing.  The feeling went away after a few minutes.  She also reports a few episodes of very brief chest pain lasting less than a second, central chest, nonradiating.  No shortness of breath.  No exertional symptoms recently.  Currently feels back to normal.  No aggravating or alleviating factors, no associated vomiting or diaphoresis.      Past Medical History:  Diagnosis Date   Bipolar disorder ([place])    Hypertension    Sleep apnea      There are no problems to display for this patient.    History reviewed. No pertinent surgical history.   Prior to Admission medications   Not on File     Allergies Lamotrigine, Penicillins, Clindamycin/lincomycin, Hydrochlorothiazide, Other, and Amiloride hydrochloride dihydrate [amiloride]   No family history on file.  Social History Social History   Tobacco Use   Smoking status: Never Smoker   Smokeless tobacco: Never Used  Scientific laboratory technician Use: Never used  Substance Use Topics   Alcohol use: Never   Drug use: Never    Review of Systems  Constitutional:   No fever or chills.  ENT:   No sore throat. No rhinorrhea. Cardiovascular:   Positive atypical chest pain as above without syncope. Respiratory:   No dyspnea or cough. Gastrointestinal:   Negative for abdominal pain, vomiting and diarrhea.  Musculoskeletal:   Negative for focal pain or swelling All other systems reviewed and are negative except as documented above in ROS and  HPI.  ____________________________________________   PHYSICAL EXAM:  VITAL SIGNS: ED Triage Vitals  Enc Vitals Group     BP 05/12/20 0326 (!) 174/101     Pulse Rate 05/12/20 0326 (!) 102     Resp 05/12/20 0326 18     Temp 05/12/20 0326 98.3 F (36.8 C)     Temp Source 05/12/20 0326 Oral     SpO2 05/12/20 0326 97 %     Weight 05/12/20 0328 201 lb (91.2 kg)     Height 05/12/20 0328 5\' 4"  (1.626 m)     Head Circumference --      Peak Flow --      Pain Score 05/12/20 0327 0     Pain Loc --      Pain Edu? --      Excl. in [place]? --     Vital signs reviewed, nursing assessments reviewed.   Constitutional:   Alert and oriented. Non-toxic appearance. Eyes:   Conjunctivae are normal. EOMI. PERRL. ENT      Head:   Normocephalic and atraumatic.      Nose:   Wearing a mask.      Mouth/Throat:   Wearing a mask.      Neck:   No meningismus. Full ROM. Hematological/Lymphatic/Immunilogical:   No cervical lymphadenopathy. Cardiovascular:   RRR. Symmetric bilateral radial and DP pulses.  No murmurs. Cap refill less than 2 seconds. Respiratory:   Normal respiratory effort without tachypnea/retractions. Breath sounds are clear and equal bilaterally. No wheezes/rales/rhonchi. Gastrointestinal:   Soft and nontender. Non distended. There is no CVA  tenderness.  No rebound, rigidity, or guarding.  Musculoskeletal:   Normal range of motion in all extremities. No joint effusions.  No lower extremity tenderness.  No edema. Neurologic:   Normal speech and language.  Motor grossly intact. No acute focal neurologic deficits are appreciated.  Skin:    Skin is warm, dry and intact. No rash noted.  No petechiae, purpura, or bullae.  ____________________________________________    LABS (pertinent positives/negatives) (all labs ordered are listed, but only abnormal results are displayed) Labs Reviewed  COMPREHENSIVE METABOLIC PANEL - Abnormal; Notable for the following components:      Result Value    Glucose, Bld 139 (*)    All other components within normal limits  CBC WITH DIFFERENTIAL/PLATELET  TROPONIN I (HIGH SENSITIVITY)  TROPONIN I (HIGH SENSITIVITY)   ____________________________________________   EKG  Interpreted by me Normal sinus rhythm rate of 100, normal axis and intervals.  Normal QRS and ST segments.  Slight T wave inversions in V2 and V3, unchanged from prior EKG March 21, 2020.  ____________________________________________    RADIOLOGY  DG Chest 2 View  Result Date: 05/12/2020 CLINICAL DATA:  Chest pain and tachycardia EXAM: CHEST - 2 VIEW COMPARISON:  03/18/2020 FINDINGS: Cardiac shadow is mildly prominent but stable. The lungs are well aerated without focal infiltrate or sizable effusion. No acute bony abnormality is noted. IMPRESSION: No active cardiopulmonary disease. Electronically Signed   By: Inez Catalina M.D.   On: 05/12/2020 03:53    ____________________________________________   PROCEDURES Procedures  ____________________________________________  DIFFERENTIAL DIAGNOSIS   GERD, palpitations, non-STEMI, idiopathic tachycardia  CLINICAL IMPRESSION / ASSESSMENT AND PLAN / ED COURSE  Medications ordered in the ED: Medications - No data to display  Pertinent labs & imaging results that were available during my care of the patient were reviewed by me and considered in my medical decision making (see chart for details).  Mary Henson was evaluated in Emergency Department on 05/12/2020 for the symptoms described in the history of present illness. She was evaluated in the context of the global COVID-19 pandemic, which necessitated consideration that the patient might be at risk for infection with the SARS-CoV-2 virus that causes COVID-19. Institutional protocols and algorithms that pertain to the evaluation of patients at risk for COVID-19 are in a state of rapid change based on information released by regulatory bodies including the CDC and federal and  state organizations. These policies and algorithms were followed during the patient's care in the ED.   Patient presents with an episode of tachycardia on awakening from sleep with very vague and atypical symptoms. Considering the patient's symptoms, medical history, and physical examination today, I have low suspicion for ACS, PE, TAD, pneumothorax, carditis, mediastinitis, pneumonia, CHF, or sepsis.  Due to age and comorbidities, serial troponins are being obtained.  Initial EKG does not show any significant changes from previous, chest x-[place] and initial troponin are normal.  Patient had a TSH checked in December 30 of 2020 which was normal.  If second troponin is also normal, would plan for discharge to follow-up with primary care.  She had a stress test about 3 years ago, primary care can decide if repeat screening is needed at this time.      ____________________________________________   FINAL CLINICAL IMPRESSION(S) / ED DIAGNOSES    Final diagnoses:  Atypical chest pain     ED Discharge Orders    None      Portions of this note were generated with dragon dictation software.  Dictation errors may occur despite best attempts at proofreading.   Carrie Mew, MD 05/12/20 [phone]

## 2020-05-12 NOTE — ED Triage Notes (Signed)
Pt  Presents to ED with feeling like her heart is racing with intermittent left shoulder discomfort and sharp pain on left side of chest just under her breast. Hx of the same. unknown cause. Pt states she was asleep at the onset of her symptoms. denies sob.

## 2020-05-12 NOTE — ED Notes (Signed)
Patient verbalizes understanding of discharge instructions. Opportunity for questioning and answers were provided. Armband removed by staff, pt discharged from ED. Ambulated out to lobby  

## 2020-06-02 ENCOUNTER — Emergency Department
Admission: EM | Admit: 2020-06-02 | Discharge: 2020-06-03 | Disposition: A | Discharge status: Home / Self Care | Payer: BC Managed Care – PPO | Attending: Emergency Medicine | Admitting: Emergency Medicine

## 2020-06-02 ENCOUNTER — Encounter: Payer: Self-pay | Admitting: Emergency Medicine

## 2020-06-02 DIAGNOSIS — I10 Essential (primary) hypertension: Secondary | ICD-10-CM | POA: Insufficient documentation

## 2020-06-02 DIAGNOSIS — R040 Epistaxis: Secondary | ICD-10-CM

## 2020-06-02 LAB — BASIC METABOLIC PANEL
Anion gap: 9 (ref 5–15)
BUN: 11 mg/dL (ref 6–20)
CO2: 25 mmol/L (ref 22–32)
Calcium: 9.4 mg/dL (ref 8.9–10.3)
Chloride: 100 mmol/L (ref 98–111)
Creatinine, Ser: 0.8 mg/dL (ref 0.44–1.00)
GFR, Estimated: >60 mL/min (ref >60)
Glucose, Bld: 110 mg/dL — ABNORMAL HIGH (ref 70–99)
Potassium: 3.4 mmol/L — ABNORMAL LOW (ref 3.5–5.1)
Sodium: 134 mmol/L — ABNORMAL LOW (ref 135–145)

## 2020-06-02 LAB — CBC
HCT: 36.4 % (ref 36.0–46.0)
Hemoglobin: 12 g/dL (ref 12.0–15.0)
MCH: 29.8 pg (ref 26.0–34.0)
MCHC: 33 g/dL (ref 30.0–36.0)
MCV: 90.3 fL (ref 80.0–100.0)
Platelets: 232 10*3/uL (ref 150–400)
RBC: 4.03 MIL/uL (ref 3.87–5.11)
RDW: 12.9 % (ref 11.5–15.5)
WBC: 6.7 10*3/uL (ref 4.0–10.5)
nRBC: 0 % (ref 0.0–0.2)

## 2020-06-02 LAB — TROPONIN I (HIGH SENSITIVITY): Troponin I (High Sensitivity): 4 ng/L (ref <18)

## 2020-06-02 MED ORDER — ATENOLOL 25 MG PO TABS
100.0000 mg | ORAL_TABLET | Freq: Once | ORAL | Status: AC
  Administered 2020-06-02: 100 mg via ORAL
  Filled 2020-06-02: qty 4

## 2020-06-02 MED ORDER — OXYMETAZOLINE HCL 0.05 % NA SOLN
1.0000 | Freq: Once | NASAL | Status: AC
  Administered 2020-06-02: 1 via NASAL
  Filled 2020-06-02: qty 30

## 2020-06-02 MED ORDER — LISINOPRIL 10 MG PO TABS
20.0000 mg | ORAL_TABLET | Freq: Once | ORAL | Status: AC
  Administered 2020-06-02: 20 mg via ORAL
  Filled 2020-06-02: qty 2

## 2020-06-02 NOTE — ED Triage Notes (Signed)
Pt arrived via EMS from home where she was experiencing epistaxis since 1900. EMS reported pt to have high blood pressure. Pt with hx/o hypertension and has taken medication today. Pt also reports she picked her nose this evening before bleeding occurred.

## 2020-06-02 NOTE — ED Triage Notes (Signed)
EMS brought pt in from hose for left sided epistaxis since 708pm; pt to lobby via w/c with no distress noted; hx HTN

## 2020-06-03 MED ORDER — ACETAMINOPHEN 500 MG PO TABS
1000.0000 mg | ORAL_TABLET | Freq: Once | ORAL | Status: AC
  Administered 2020-06-03: 1000 mg via ORAL

## 2020-06-03 NOTE — ED Provider Notes (Signed)
[facility] Emergency Department Provider Note  ____________________________________________  Time seen: Approximately 12:46 AM  I have reviewed the triage vital signs and the nursing notes.   HISTORY  Chief Complaint Epistaxis   HPI Mary Henson is a 54 y.o. female with a history of hypertension who presents for evaluation of a nosebleed.  Patient reports that she was picking her nose earlier today when she started having a bleed.  The bleeding started at 7 PM.   Patient has a history of hypertension but has been compliant with her medications.  She reports having 1 prior episode of epistaxis when she was much younger for which she was seen by ENT and had a cauterized.  At this point the bleeding has subsided.  She does not take any blood thinners.  No dizziness, chest pain, shortness of breath.  No history of bleeding disorder.  Past Medical History:  Diagnosis Date   Bipolar disorder ([place])    Hypertension    Sleep apnea     There are no problems to display for this patient.   History reviewed. No pertinent surgical history.  Prior to Admission medications   Not on File    Allergies Lamotrigine, Penicillins, Clindamycin/lincomycin, Hydrochlorothiazide, Other, and Amiloride hydrochloride dihydrate [amiloride]  History reviewed. No pertinent family history.  Social History Social History   Tobacco Use   Smoking status: Never Smoker   Smokeless tobacco: Never Used  Scientific laboratory technician Use: Never used  Substance Use Topics   Alcohol use: Never   Drug use: Never    Review of Systems  Constitutional: Negative for fever. Eyes: Negative for visual changes. ENT: Negative for sore throat. + Epistaxis Neck: No neck pain  Cardiovascular: Negative for chest pain. Respiratory: Negative for shortness of breath. Gastrointestinal: Negative for abdominal pain, vomiting or diarrhea. Genitourinary: Negative for dysuria. Musculoskeletal:  Negative for back pain. Skin: Negative for rash. Neurological: Negative for headaches, weakness or numbness. Psych: No SI or HI  ____________________________________________   PHYSICAL EXAM:  VITAL SIGNS: ED Triage Vitals  Enc Vitals Group     BP 06/02/20 2019 (!) 187/93     Pulse Rate 06/02/20 2019 88     Resp 06/02/20 2019 18     Temp 06/02/20 2019 98.3 F (36.8 C)     Temp Source 06/02/20 2019 Oral     SpO2 06/02/20 2010 97 %     Weight --      Height --      Head Circumference --      Peak Flow --      Pain Score --      Pain Loc --      Pain Edu? --      Excl. in [place]? --     Constitutional: Alert and oriented. Well appearing and in no apparent distress. HEENT:      Head: Normocephalic and atraumatic.         Eyes: Conjunctivae are normal. Sclera is non-icteric.       Mouth/Throat: Mucous membranes are moist.       Nose: L nare with several large clots which were evacuated, no obvious signs of active bleeding or any sites of trauma visualized on exam.      Neck: Supple with no signs of meningismus. Cardiovascular: Regular rate and rhythm.  Respiratory: Normal respiratory effort.  Musculoskeletal: No edema, cyanosis, or erythema of extremities. Neurologic: Normal speech and language. Face is symmetric. Moving all extremities. No  gross focal neurologic deficits are appreciated. Skin: Skin is warm, dry and intact. No rash noted. Psychiatric: Mood and affect are normal. Speech and behavior are normal.  ____________________________________________   LABS (all labs ordered are listed, but only abnormal results are displayed)  Labs Reviewed  BASIC METABOLIC PANEL - Abnormal; Notable for the following components:      Result Value   Sodium 134 (*)    Potassium 3.4 (*)    Glucose, Bld 110 (*)    All other components within normal limits  CBC  TROPONIN I (HIGH SENSITIVITY)  TROPONIN I (HIGH SENSITIVITY)   ____________________________________________  EKG  none   ____________________________________________  RADIOLOGY  none  ____________________________________________   PROCEDURES  Procedure(s) performed: None Procedures Critical Care performed:  None ____________________________________________   INITIAL IMPRESSION / ASSESSMENT AND PLAN / ED COURSE   54 y.o. female with a history of hypertension who presents for evaluation of a nosebleed.  At this time bleeding has subsided.  During my evaluation patient had several large clots in the left nare which were evacuated. Afrin and pressure applied for 30 min.  After that physical exam showed no signs of trauma or any lacerations.  Patient be discharged home with Afrin and nasal prongs.  Recommended reapplying Afrin and pressure for 30 minutes at home if the bleeding recurs.  She is not on blood thinners.  She is hemodynamically stable.  Initially her pressure was extremely elevated which improved after patient was given her home dose of lisinopril and atenolol.  At this time she is stable for discharge home.  Will refer to ENT.  Old medical records reviewed.      _____________________________________________ Please note:  Patient was evaluated in Emergency Department today for the symptoms described in the history of present illness. Patient was evaluated in the context of the global COVID-19 pandemic, which necessitated consideration that the patient might be at risk for infection with the SARS-CoV-2 virus that causes COVID-19. Institutional protocols and algorithms that pertain to the evaluation of patients at risk for COVID-19 are in a state of rapid change based on information released by regulatory bodies including the CDC and federal and state organizations. These policies and algorithms were followed during the patient's care in the ED.  Some ED evaluations and interventions may be delayed as a result of limited staffing during the pandemic.   [place] Controlled Substance Database was reviewed by  me. ____________________________________________   FINAL CLINICAL IMPRESSION(S) / ED DIAGNOSES   Final diagnoses:  Left-sided epistaxis      NEW MEDICATIONS STARTED DURING THIS VISIT:  ED Discharge Orders    None       Note:  This document was prepared using Dragon voice recognition software and may include unintentional dictation errors.    Rudene Re, MD 06/03/20 [phone]

## 2020-06-19 ENCOUNTER — Other Ambulatory Visit (INDEPENDENT_AMBULATORY_CARE_PROVIDER_SITE_OTHER): Payer: Self-pay | Admitting: Internal Medicine

## 2020-07-09 ENCOUNTER — Other Ambulatory Visit: Payer: Self-pay

## 2020-07-09 ENCOUNTER — Emergency Department
Admission: EM | Admit: 2020-07-09 | Discharge: 2020-07-09 | Disposition: A | Discharge status: Home / Self Care | Payer: BC Managed Care – PPO | Attending: Emergency Medicine | Admitting: Emergency Medicine

## 2020-07-09 ENCOUNTER — Encounter: Payer: Self-pay | Admitting: Emergency Medicine

## 2020-07-09 ENCOUNTER — Emergency Department: Payer: BC Managed Care – PPO

## 2020-07-09 DIAGNOSIS — R519 Headache, unspecified: Secondary | ICD-10-CM | POA: Insufficient documentation

## 2020-07-09 DIAGNOSIS — I10 Essential (primary) hypertension: Secondary | ICD-10-CM | POA: Diagnosis not present

## 2020-07-09 DIAGNOSIS — Z20822 Contact with and (suspected) exposure to covid-19: Secondary | ICD-10-CM | POA: Insufficient documentation

## 2020-07-09 DIAGNOSIS — R42 Dizziness and giddiness: Secondary | ICD-10-CM | POA: Insufficient documentation

## 2020-07-09 LAB — URINALYSIS, COMPLETE (UACMP) WITH MICROSCOPIC
Bilirubin Urine: NEGATIVE
Glucose, UA: NEGATIVE mg/dL
Ketones, ur: NEGATIVE mg/dL
Nitrite: NEGATIVE
Protein, ur: NEGATIVE mg/dL
Specific Gravity, Urine: 1.003 — ABNORMAL LOW (ref 1.005–1.030)
Squamous Epithelial / HPF: NONE SEEN (ref 0–5)
pH: 7 (ref 5.0–8.0)

## 2020-07-09 LAB — COMPREHENSIVE METABOLIC PANEL
ALT: 16 U/L (ref 0–44)
AST: 18 U/L (ref 15–41)
Albumin: 4 g/dL (ref 3.5–5.0)
Alkaline Phosphatase: 83 U/L (ref 38–126)
Anion gap: 11 (ref 5–15)
BUN: 10 mg/dL (ref 6–20)
CO2: 24 mmol/L (ref 22–32)
Calcium: 8.8 mg/dL — ABNORMAL LOW (ref 8.9–10.3)
Chloride: 100 mmol/L (ref 98–111)
Creatinine, Ser: 0.85 mg/dL (ref 0.44–1.00)
GFR, Estimated: >60 mL/min (ref >60)
Glucose, Bld: 115 mg/dL — ABNORMAL HIGH (ref 70–99)
Potassium: 3.7 mmol/L (ref 3.5–5.1)
Sodium: 135 mmol/L (ref 135–145)
Total Bilirubin: 0.4 mg/dL (ref 0.3–1.2)
Total Protein: 7.6 g/dL (ref 6.5–8.1)

## 2020-07-09 LAB — CBC
HCT: 36.5 % (ref 36.0–46.0)
Hemoglobin: 11.8 g/dL — ABNORMAL LOW (ref 12.0–15.0)
MCH: 29.5 pg (ref 26.0–34.0)
MCHC: 32.3 g/dL (ref 30.0–36.0)
MCV: 91.3 fL (ref 80.0–100.0)
Platelets: 240 10*3/uL (ref 150–400)
RBC: 4 MIL/uL (ref 3.87–5.11)
RDW: 12.8 % (ref 11.5–15.5)
WBC: 5.8 10*3/uL (ref 4.0–10.5)
nRBC: 0 % (ref 0.0–0.2)

## 2020-07-09 LAB — RESPIRATORY PANEL BY RT PCR (FLU A&B, COVID)
Influenza A by PCR: NEGATIVE
Influenza B by PCR: NEGATIVE
SARS Coronavirus 2 by RT PCR: NEGATIVE

## 2020-07-09 LAB — TROPONIN I (HIGH SENSITIVITY)
Troponin I (High Sensitivity): 4 ng/L (ref <18)
Troponin I (High Sensitivity): 4 ng/L (ref <18)

## 2020-07-09 LAB — POC URINE PREG, ED: Preg Test, Ur: NEGATIVE

## 2020-07-09 IMAGING — MR MR MRA HEAD W/O CM
1 series · 18 of 48 positions shown · IV contrast (gadavist)
Comparison: None.

CLINICAL DATA: Headache, new or worsening. Reportedly has known
brain aneurysm.

EXAM:
MRI HEAD WITHOUT AND WITH CONTRAST
MRA HEAD WITHOUT CONTRAST
TECHNIQUE: Multiplanar, multiecho pulse sequences of the brain and surrounding
structures were obtained without and with intravenous contrast.
Angiographic images of the Circle of [place] were obtained using MRA
technique without intravenous contrast.
CONTRAST:  10mL GADAVIST GADOBUTROL 1 MMOL/ML IV SOLN

[Series 5: TOF · axial · 0.5mm · 0.41mm/px · z∈[-141,-44]mm · 18 of 205 slices shown]
[im 1/205]
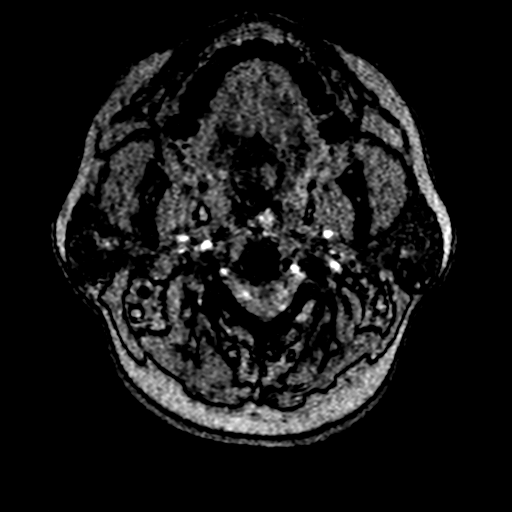
[im 5/205]
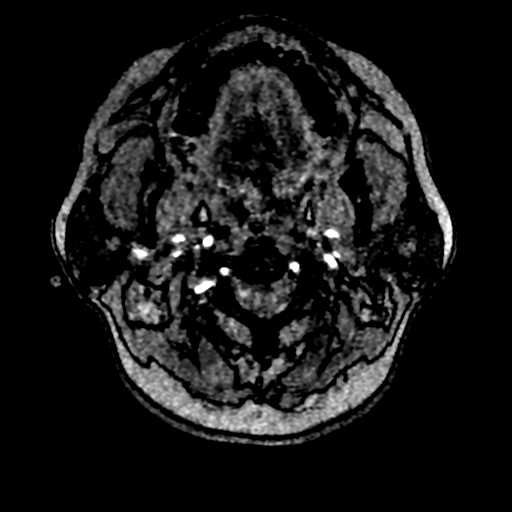
[im 9/205]
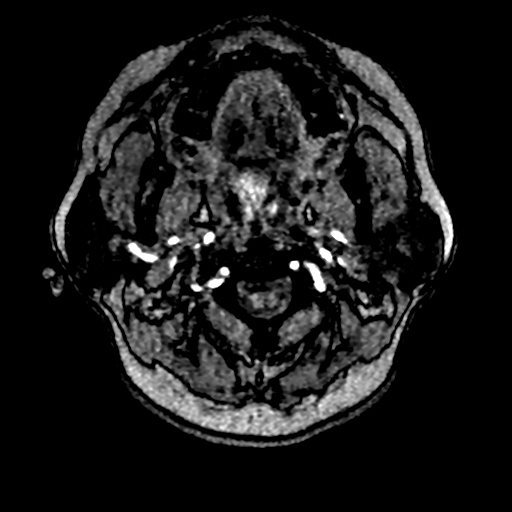
[im 14/205]
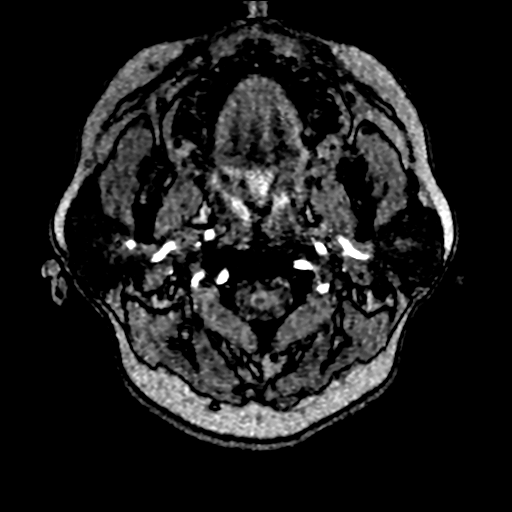
[im 18/205]
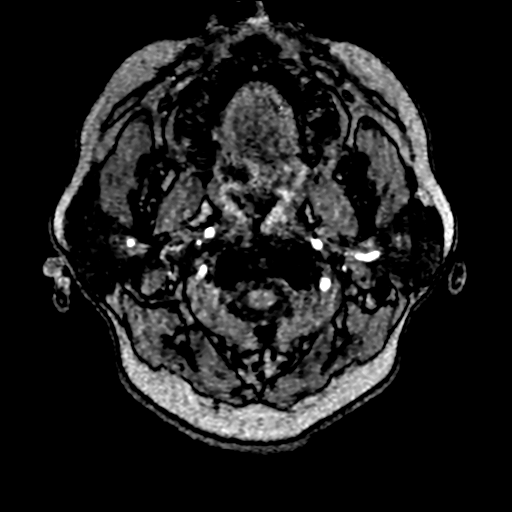
[im 22/205]
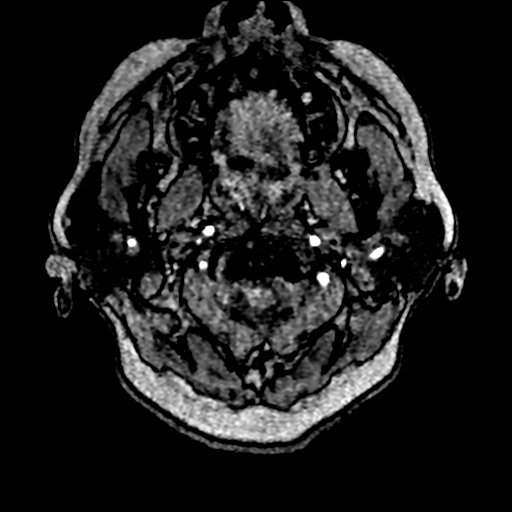
[im 27/205]
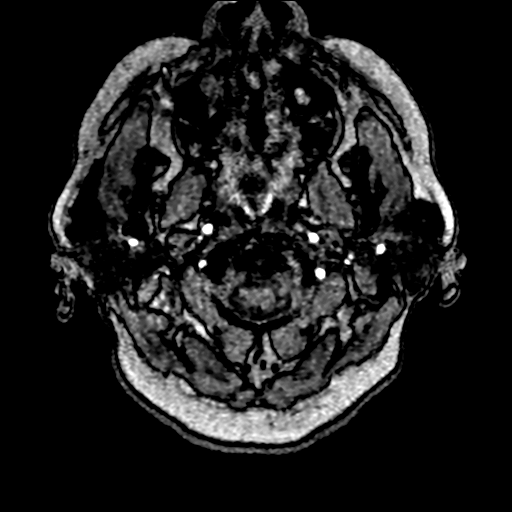
[im 31/205]
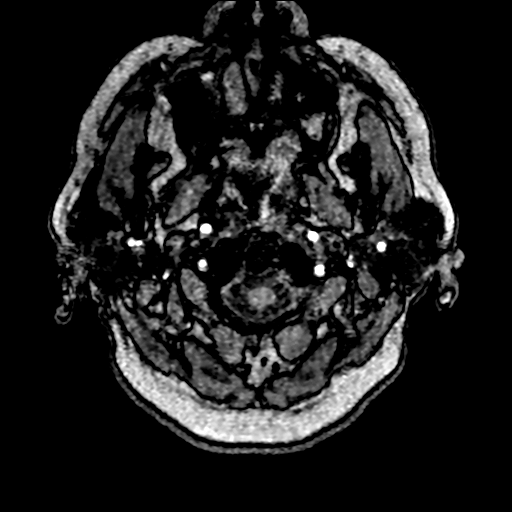
[im 35/205]
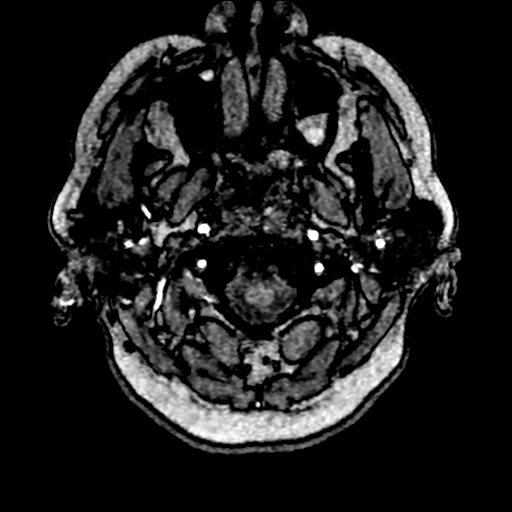
[im 40/205]
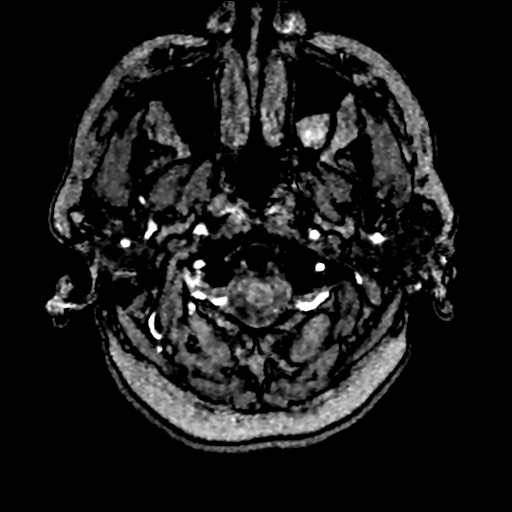
[im 66/205]
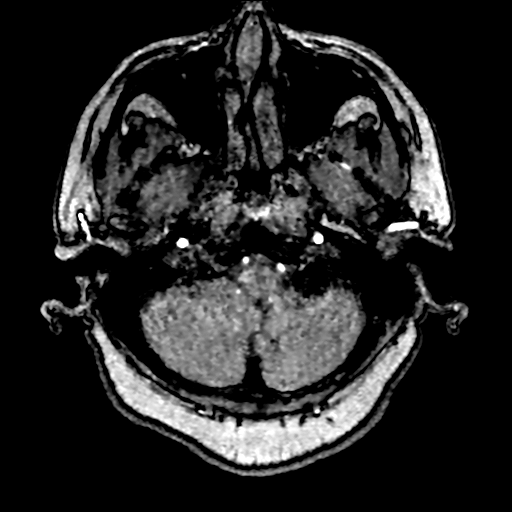
[im 92/205]
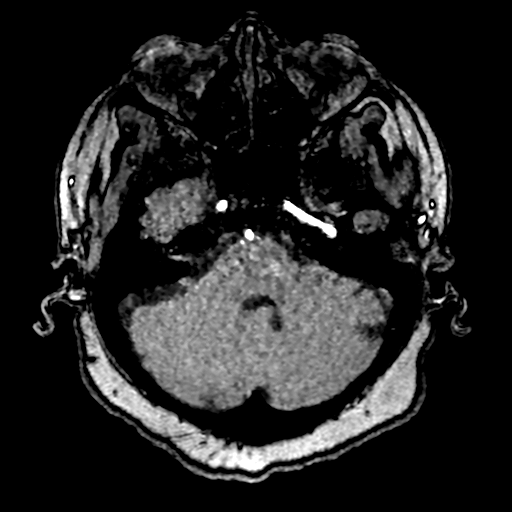
[im 105/205]
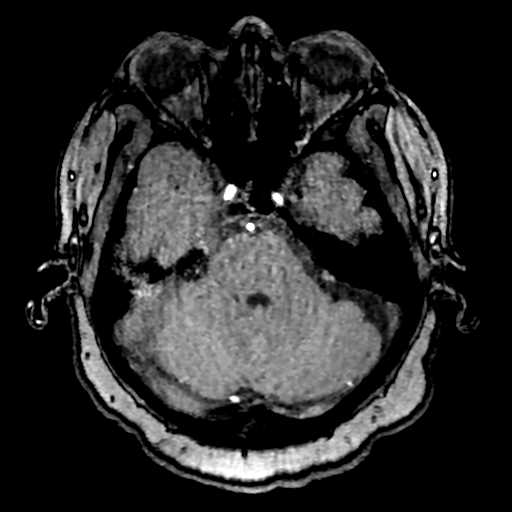
[im 118/205]
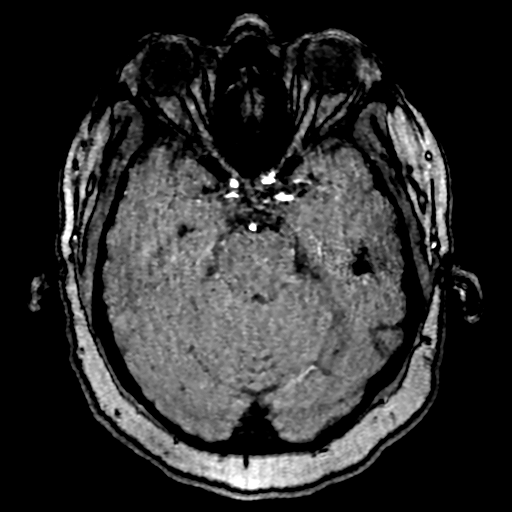
[im 144/205]
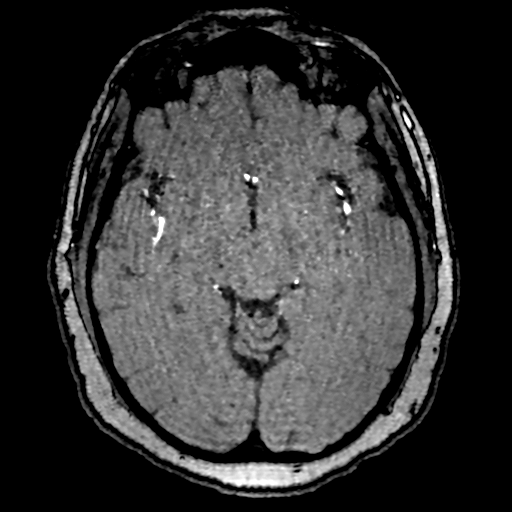
[im 170/205]
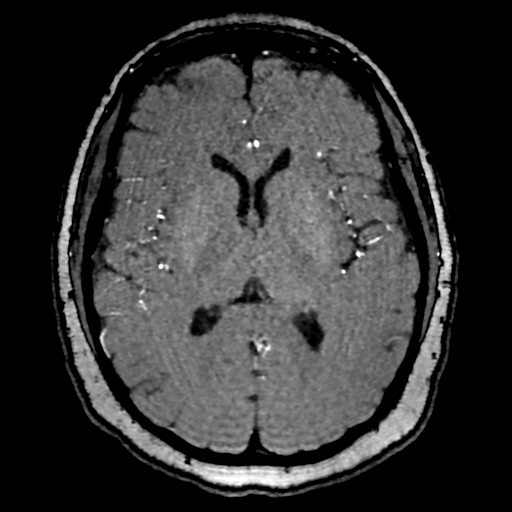
[im 174/205]
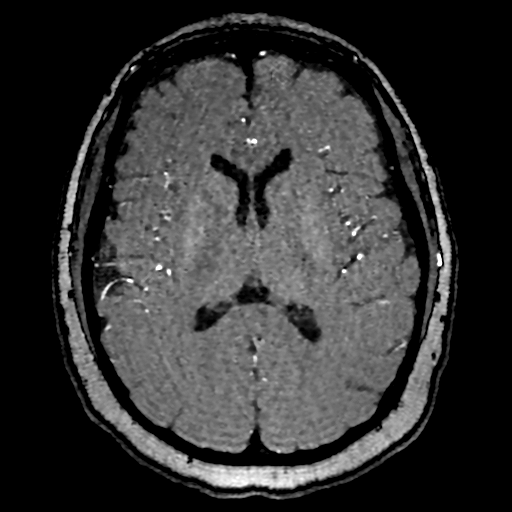
[im 196/205]
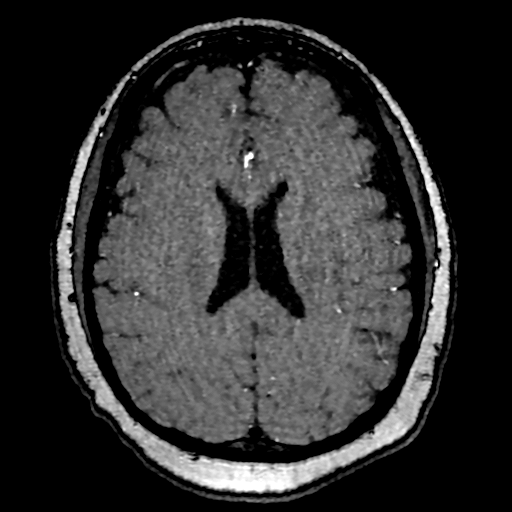

[18 of 48 positions shown; findings below may reference images not displayed]

FINDINGS: MRI HEAD FINDINGS

Brain: No acute infarction, hemorrhage, hydrocephalus, extra-axial
collection or mass lesion. No abnormal enhancement.

Skull and upper cervical spine: Normal marrow signal.

Sinuses/Orbits: Bilateral inferior maxillary sinus mucosal
thickening with inferior left maxillary sinus retention cyst versus
polyp.

Other: No mastoid effusions.

MRA HEAD FINDINGS

Motion limited exam.

Approximately 2-3z mm aneurysm arising from the right A1/A2 ACA,
which is inferiorly and posteriorly directed. Suspected additional
aneurysm of the left internal carotid artery terminus and proximal
left M1 MCA. Medially directed aneurysm of the left ICA terminus.
The size of this aneurysm is difficult to quantify by MRA technique,
but estimated to be approximately 3-4 mm. Consider CTA evaluation on
follow-up to better characterize. a Suspected small (approximately
1-2 mm) inferiorly directed aneurysm of the left proximal M1 MCA.

Bilateral ICAs are patent. Apparent mild-to-moderate narrowing of
the right paraclinoid ICA. Bilateral MCA and ACA branches are patent
with multifocal mild narrowing. Bilateral ACA branches are patent
with apparent moderate narrowing of the left A2 ACA.

The vertebral arteries, basilar artery, and bilateral cerebral
arteries are patent without evidence of hemodynamically significant
stenosis or large vessel occlusion.
IMPRESSION: 1. Evidence of multiple intracranial aneurysms, including:
*Approximately 2-3 mm aneurysm arising from the right A1/A2 ACA.
*Medially directed aneurysm of the left ICA terminus. The size of
this aneurysm is difficult to quantify by motion-limited MRA
technique, but estimated to be approximately 3-4 mm. Consider CTA
evaluation on follow-up to better characterize.
*Suspected small (approximately 1-2 mm) inferiorly directed aneurysm
of the left proximal M1 MCA.

2. No acute intracranial abnormality. No evidence of acute
hemorrhage.

3. No large vessel occlusion. Moderate left A2 ACA and
mild-to-moderate right paraclinoid ICA narrowing with additional
multifocal mild narrowing throughout the vasculature, likely related
to atherosclerosis.

## 2020-07-09 IMAGING — MR MR HEAD WO/W CM
13 series · 44 of 48 positions shown · IV contrast (10ml Gadavist)
Comparison: None.

CLINICAL DATA: Headache, new or worsening. Reportedly has known
brain aneurysm.

EXAM:
MRI HEAD WITHOUT AND WITH CONTRAST
MRA HEAD WITHOUT CONTRAST
TECHNIQUE: Multiplanar, multiecho pulse sequences of the brain and surrounding
structures were obtained without and with intravenous contrast.
Angiographic images of the Circle of Willis were obtained using MRA
technique without intravenous contrast.
CONTRAST:  10mL GADAVIST GADOBUTROL 1 MMOL/ML IV SOLN

[Series 5: ax dwi_tracew · axial · 3.0mm · 0.60mm/px · z∈[-131,+24]mm · 2 of 48 slices shown]
[im 1/48]
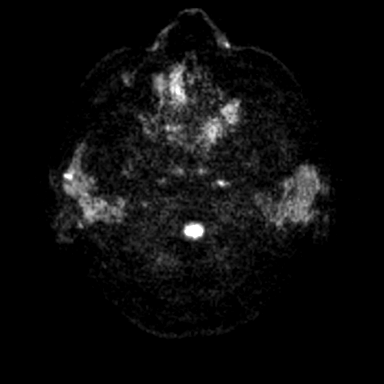
[im 48/48]
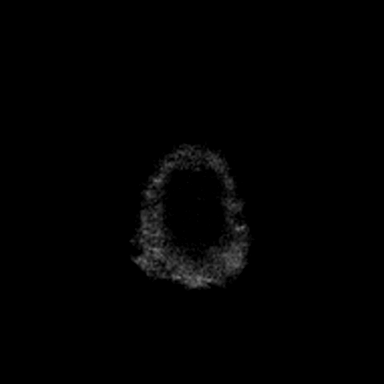

[Series 6: ax dwi_adc · axial · 3.0mm · 0.60mm/px · z∈[-131,+24]mm · 3 of 48 slices shown]
[im 1/48]
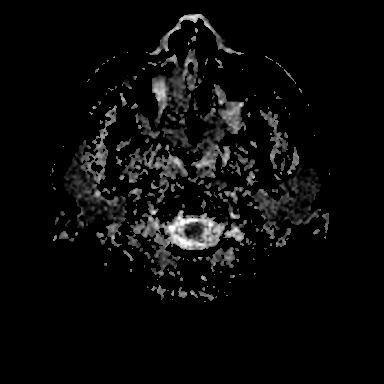
[im 24/48]
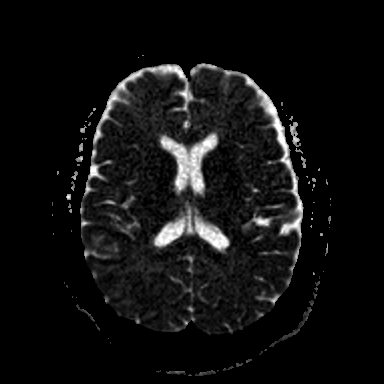
[im 48/48]
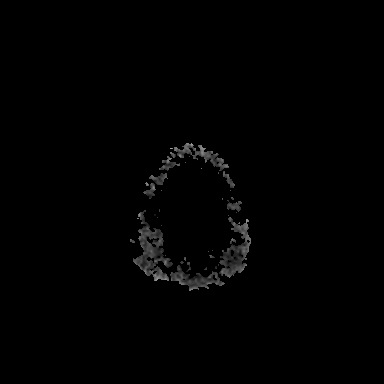

[Series 7: cor dwi_tracew · coronal · 5.0mm · 0.60mm/px · 2 of 38 slices shown]
[im 1/38]
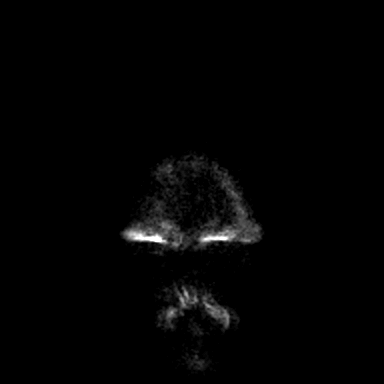
[im 38/38]
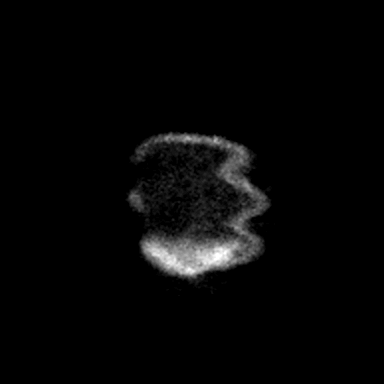

[Series 8: cor dwi_adc · coronal · 5.0mm · 0.60mm/px · 2 of 38 slices shown]
[im 1/38]
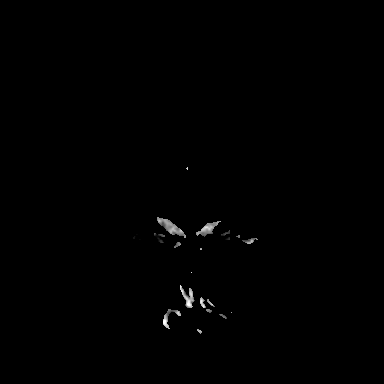
[im 38/38]
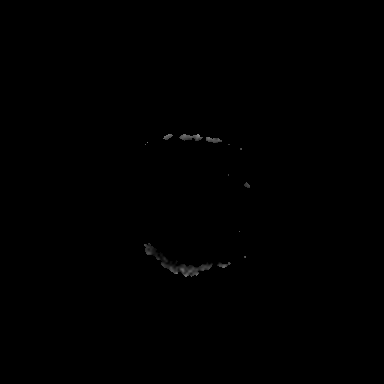

[Series 9: T1 · sagittal · 5.0mm · 0.62mm/px · 2 of 25 slices shown (1 of 2)]
[im 1/25]
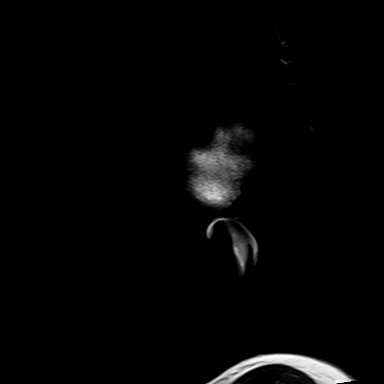
[im 25/25]
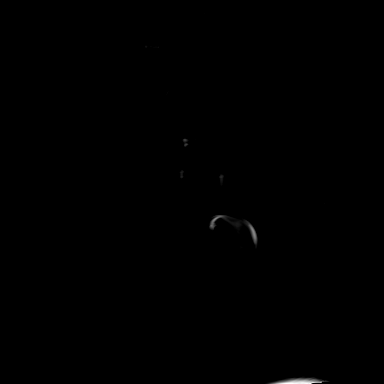

[Series 10: T2 · axial · 5.0mm · 0.53mm/px · z∈[-130,+25]mm · 2 of 27 slices shown]
[im 1/27]
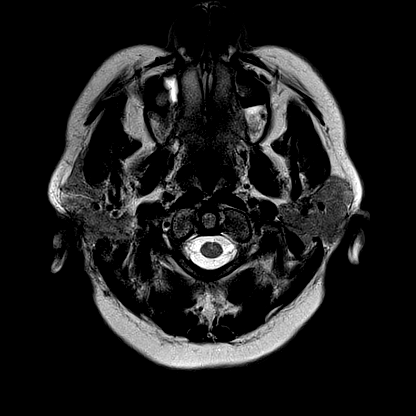
[im 27/27]
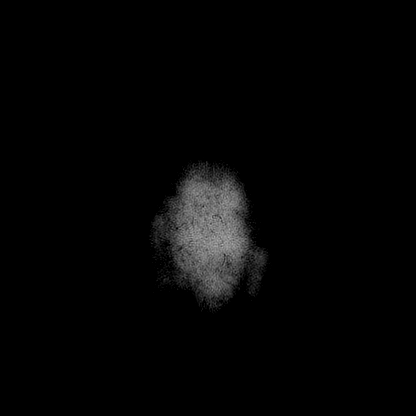

[Series 12: pha_images · axial · 3.0mm · 0.90mm/px · z∈[-138,+35]mm · 4 of 59 slices shown]
[im 1/59]
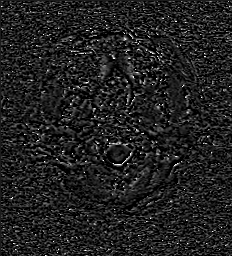
[im 20/59]
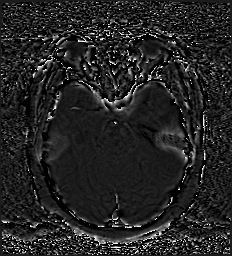
[im 39/59]
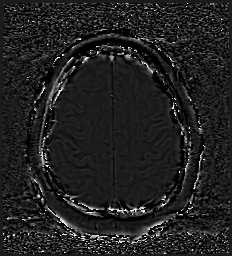
[im 59/59]
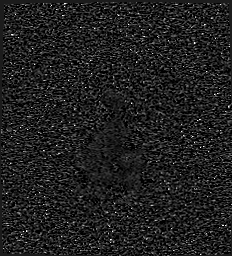

[Series 13: swi_images · axial · 3.0mm · 0.90mm/px · z∈[-141,+35]mm · 4 of 60 slices shown]
[im 1/60]
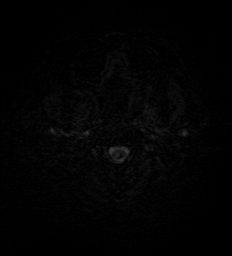
[im 20/60]
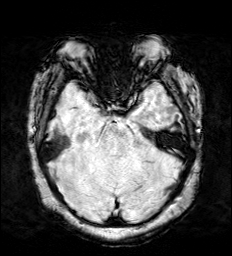
[im 40/60]
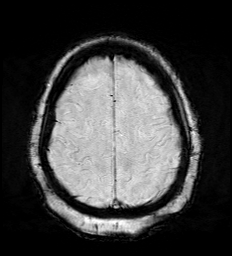
[im 60/60]
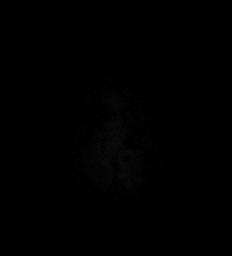

[Series 15: FLAIR · axial · 3.0mm · 0.53mm/px · z∈[-133,+28]mm · 3 of 55 slices shown]
[im 1/55]
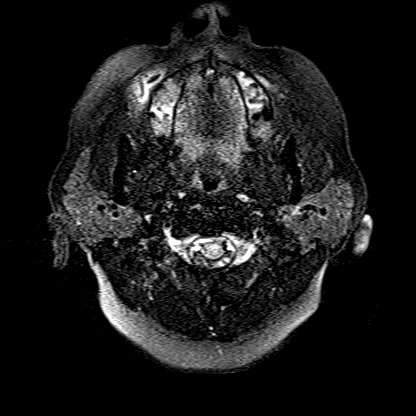
[im 28/55]
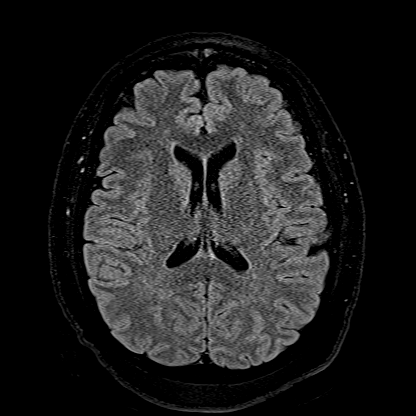
[im 55/55]
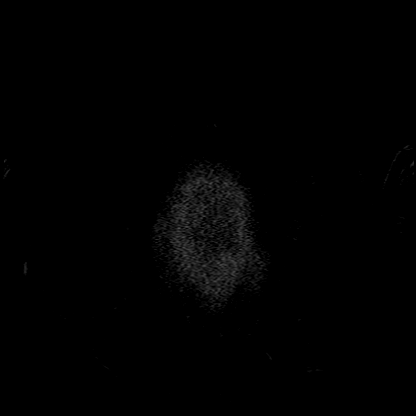

[Series 16: T1 · axial · 1.0mm · 0.98mm/px · z∈[-133,+25]mm · 8 of 160 slices shown (2 of 2)]
[im 1/160]
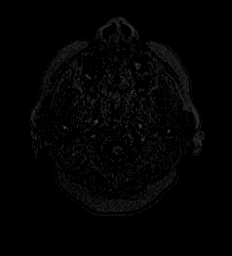
[im 18/160]
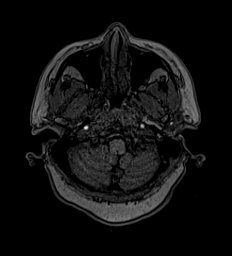
[im 54/160]
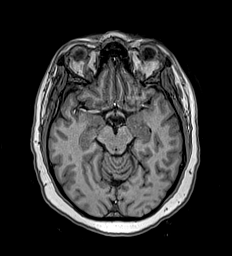
[im 71/160]
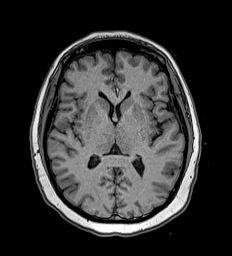
[im 89/160]
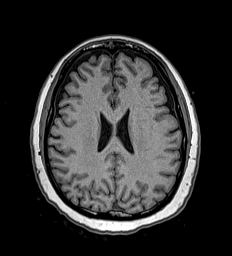
[im 107/160]
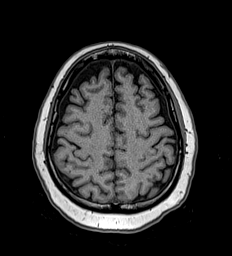
[im 142/160]
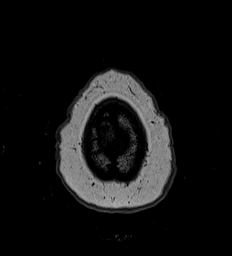
[im 160/160]
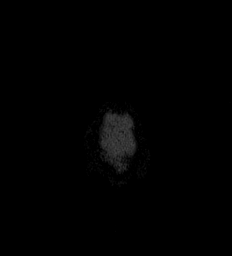

[Series 17: T2 post-contrast · coronal · 5.0mm · 0.57mm/px · 2 of 29 slices shown]
[im 1/29]
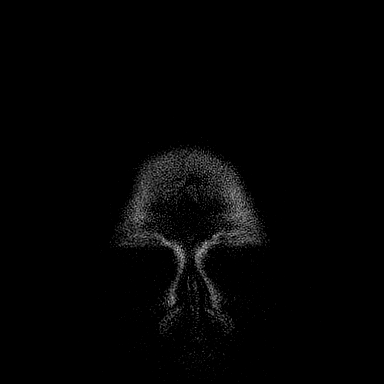
[im 29/29]
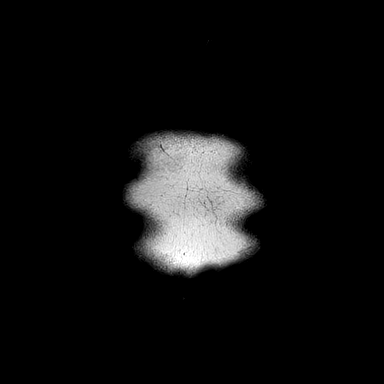

[Series 18: T1 post-contrast · axial · 1.0mm · 0.98mm/px · z∈[-133,+25]mm · 8 of 160 slices shown (1 of 2)]
[im 1/160]
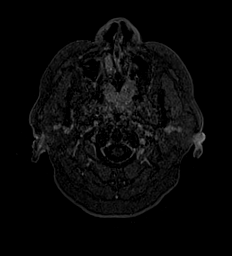
[im 18/160]
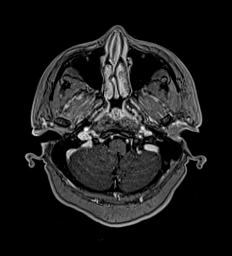
[im 54/160]
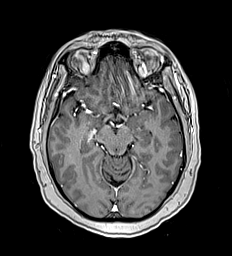
[im 71/160]
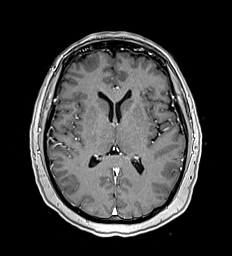
[im 89/160]
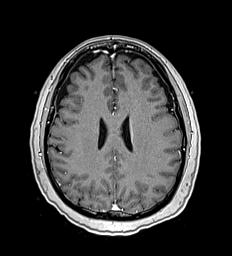
[im 107/160]
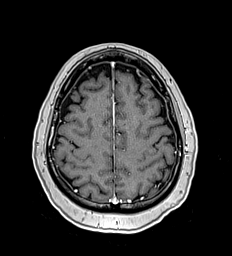
[im 142/160]
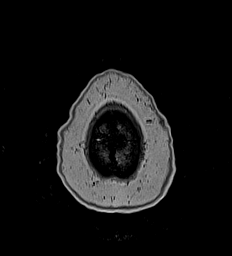
[im 160/160]
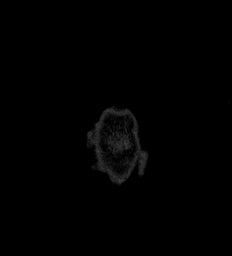

[Series 19: T1 post-contrast · coronal · 5.0mm · 0.57mm/px · 2 of 29 slices shown (2 of 2)]
[im 1/29]
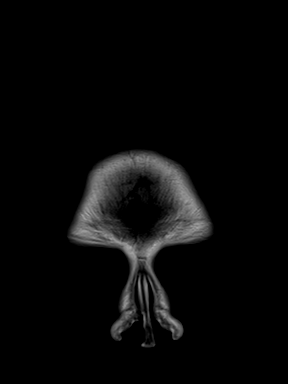
[im 29/29]
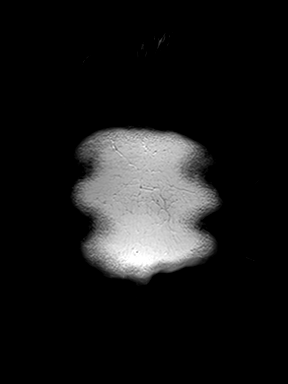

[44 of 48 positions shown; findings below may reference images not displayed]

FINDINGS: MRI HEAD FINDINGS

Brain: No acute infarction, hemorrhage, hydrocephalus, extra-axial
collection or mass lesion. No abnormal enhancement.

Skull and upper cervical spine: Normal marrow signal.

Sinuses/Orbits: Bilateral inferior maxillary sinus mucosal
thickening with inferior left maxillary sinus retention cyst versus
polyp.

Other: No mastoid effusions.

MRA HEAD FINDINGS

Motion limited exam.

Approximately 2-3z mm aneurysm arising from the right A1/A2 ACA,
which is inferiorly and posteriorly directed. Suspected additional
aneurysm of the left internal carotid artery terminus and proximal
left M1 MCA. Medially directed aneurysm of the left ICA terminus.
The size of this aneurysm is difficult to quantify by MRA technique,
but estimated to be approximately 3-4 mm. Consider CTA evaluation on
follow-up to better characterize. a Suspected small (approximately
1-2 mm) inferiorly directed aneurysm of the left proximal M1 MCA.

Bilateral ICAs are patent. Apparent mild-to-moderate narrowing of
the right paraclinoid ICA. Bilateral MCA and ACA branches are patent
with multifocal mild narrowing. Bilateral ACA branches are patent
with apparent moderate narrowing of the left A2 ACA.

The vertebral arteries, basilar artery, and bilateral cerebral
arteries are patent without evidence of hemodynamically significant
stenosis or large vessel occlusion.
IMPRESSION: 1. Evidence of multiple intracranial aneurysms, including:
*Approximately 2-3 mm aneurysm arising from the right A1/A2 ACA.
*Medially directed aneurysm of the left ICA terminus. The size of
this aneurysm is difficult to quantify by motion-limited MRA
technique, but estimated to be approximately 3-4 mm. Consider CTA
evaluation on follow-up to better characterize.
*Suspected small (approximately 1-2 mm) inferiorly directed aneurysm
of the left proximal M1 MCA.

2. No acute intracranial abnormality. No evidence of acute
hemorrhage.

3. No large vessel occlusion. Moderate left A2 ACA and
mild-to-moderate right paraclinoid ICA narrowing with additional
multifocal mild narrowing throughout the vasculature, likely related
to atherosclerosis.

## 2020-07-09 IMAGING — CT CT HEAD W/O CM
3 series · 16 of 47 positions shown, 19 images · non-contrast
Comparison: None.

CLINICAL DATA: Central vertigo

EXAM:
CT HEAD WITHOUT CONTRAST
TECHNIQUE: Contiguous axial images were obtained from the base of the skull
through the vertex without intravenous contrast.

[Series 2: head wo · axial · 0.40mm/px · z∈[-63,+62]mm · 10 of 31 slices shown, 13 images]
[im 3/31  brain]
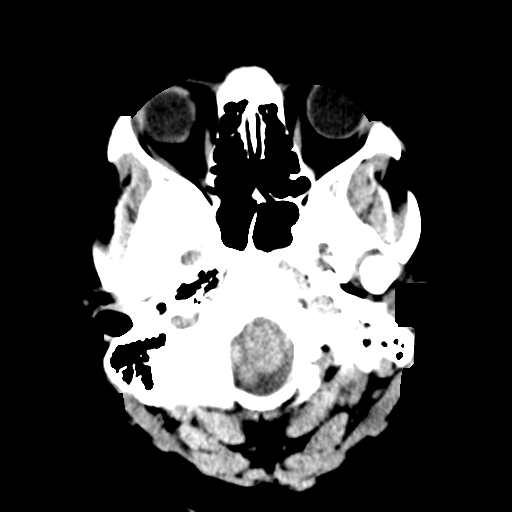
[im 3/31  bone]
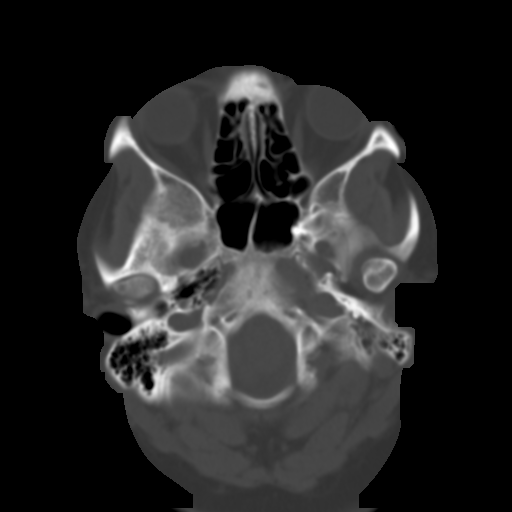
[im 6/31  brain]
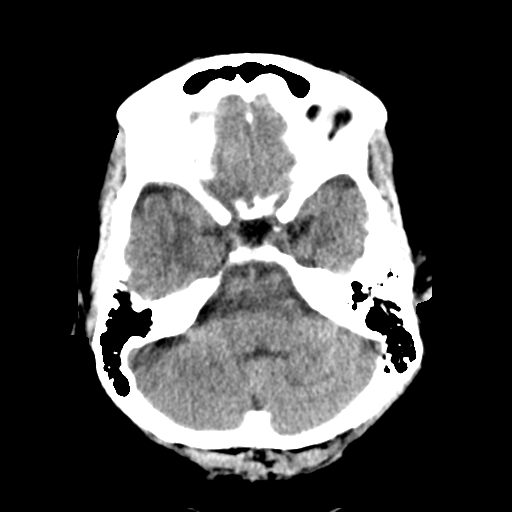
[im 9/31  brain]
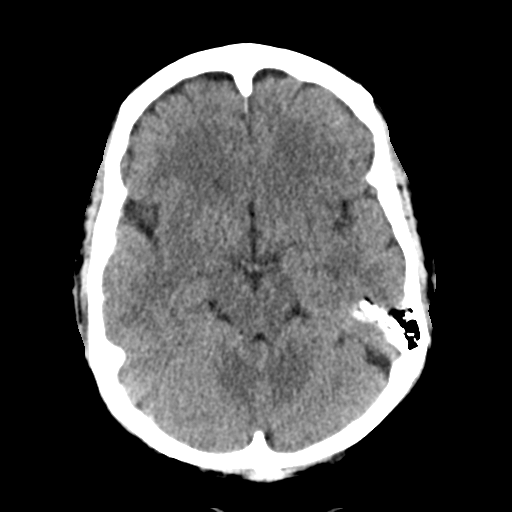
[im 11/31  brain]
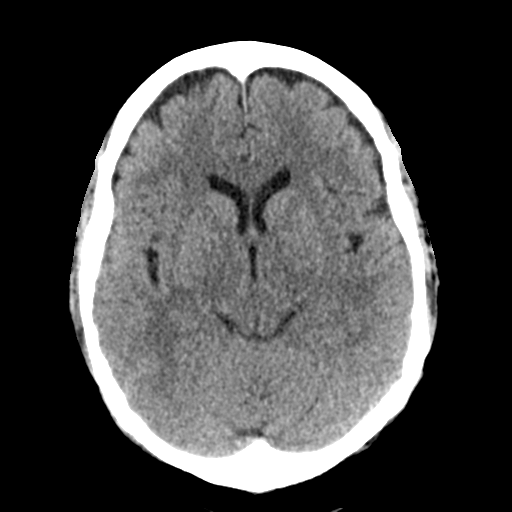
[im 14/31  brain]
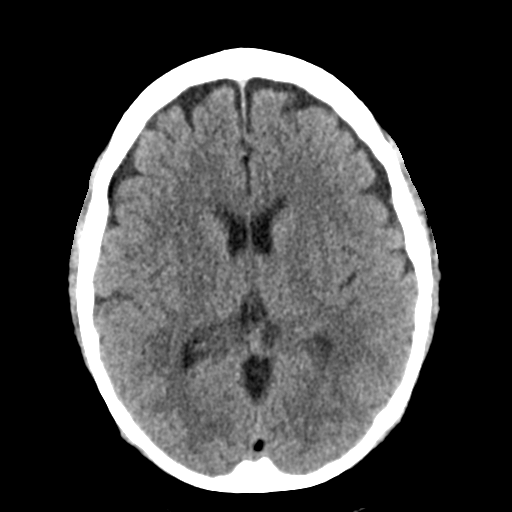
[im 14/31  bone]
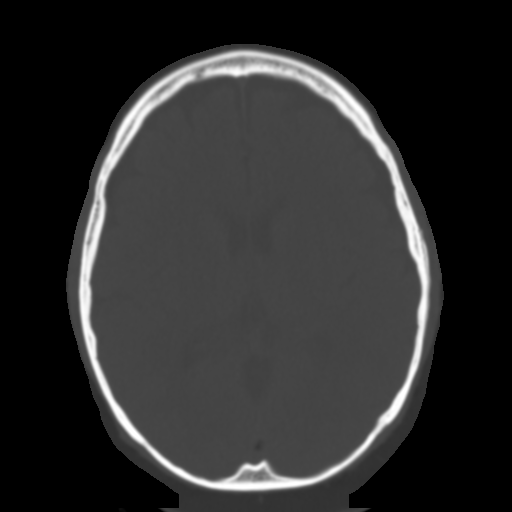
[im 17/31  brain]
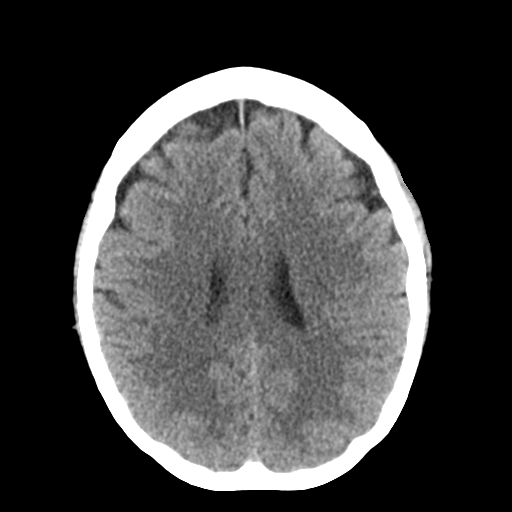
[im 20/31  brain]
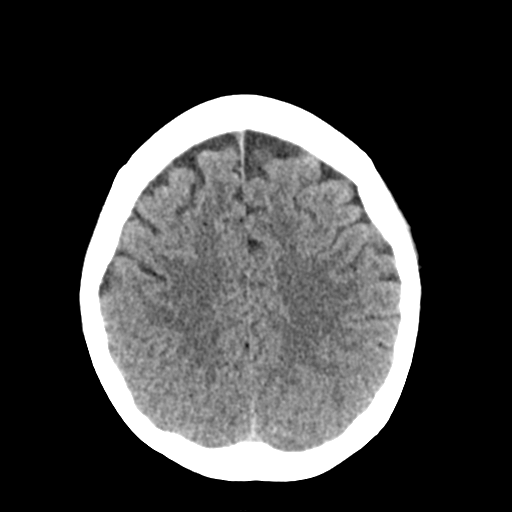
[im 23/31  brain]
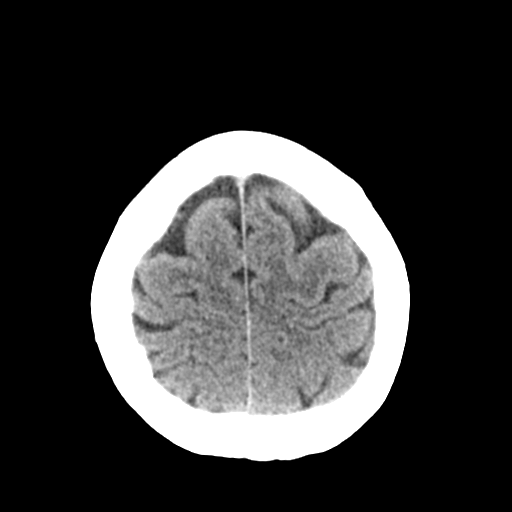
[im 25/31  brain]
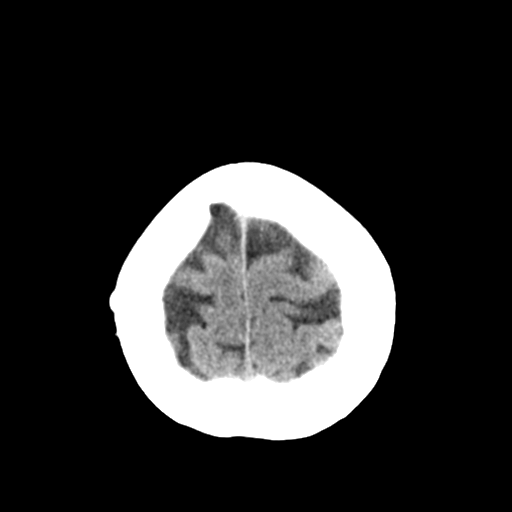
[im 25/31  bone]
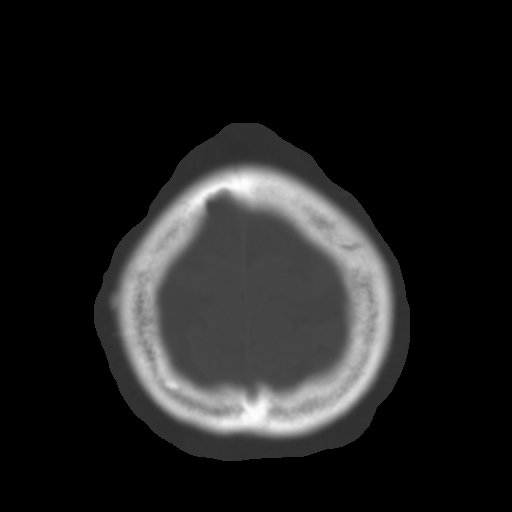
[im 28/31  brain]
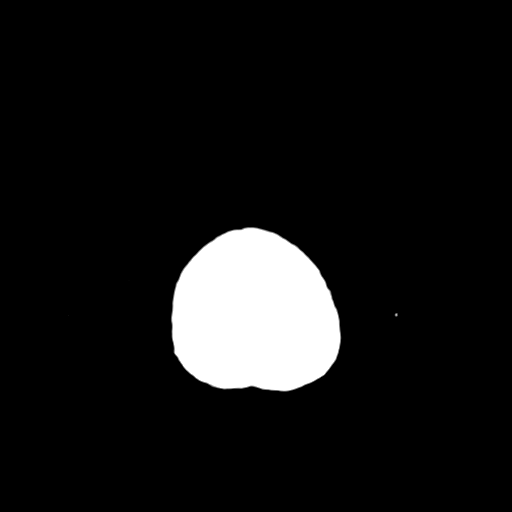

[Series 4: coronal soft tissue · coronal · 0.33mm/px · 3 of 68 slices shown]
[im 23/68  brain]
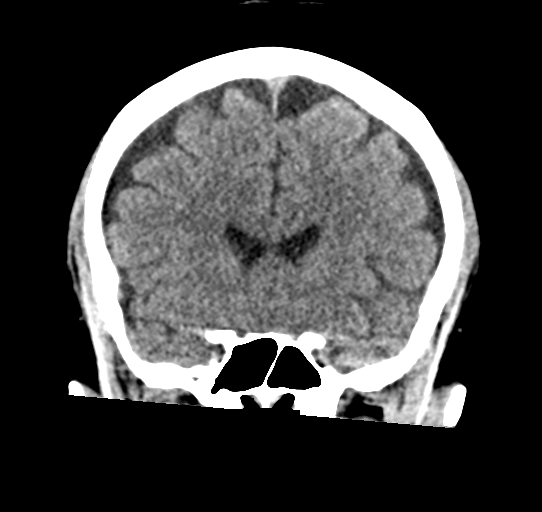
[im 30/68  brain]
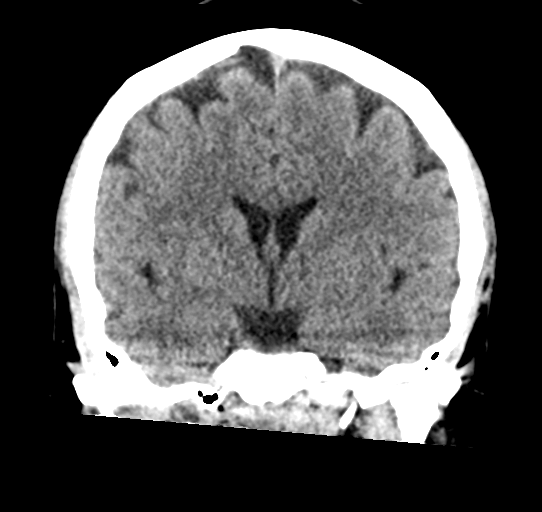
[im 38/68  brain]
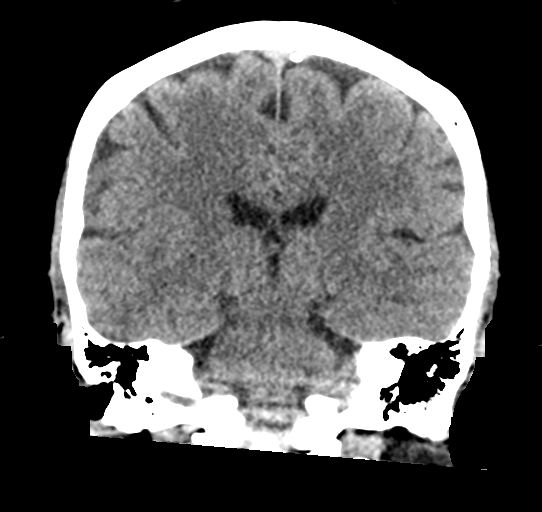

[Series 5: sagittal soft tissue · sagittal · 0.33mm/px · 3 of 61 slices shown]
[im 21/61  brain]
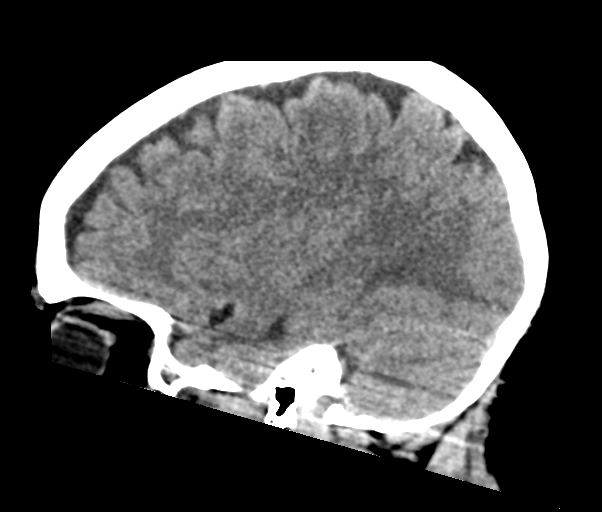
[im 31/61  brain]
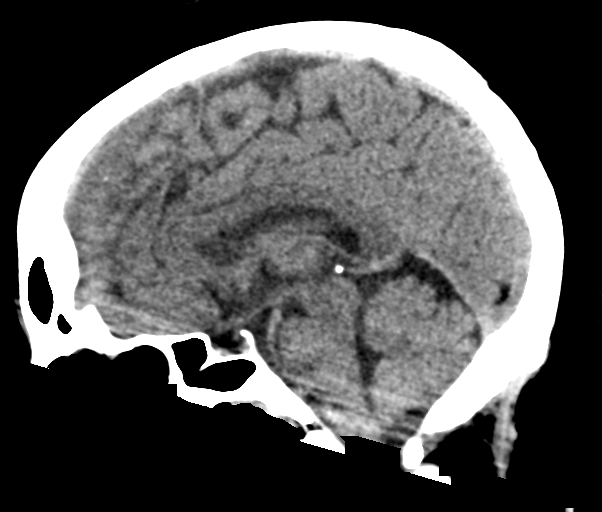
[im 41/61  brain]
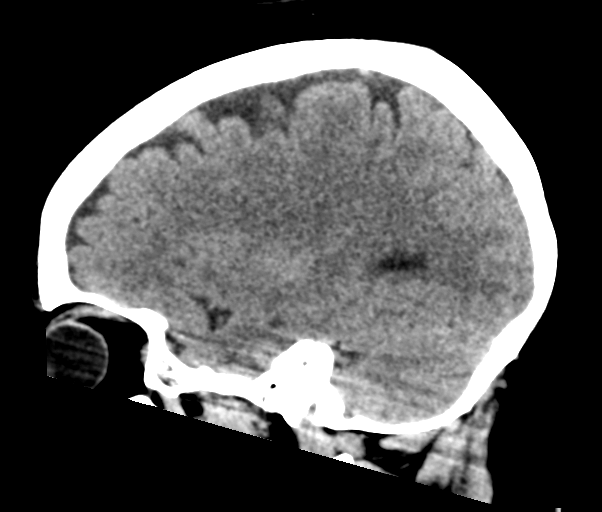

[16 of 47 positions shown; findings below may reference images not displayed]

FINDINGS: Brain: No evidence of acute infarction, hemorrhage, hydrocephalus,
extra-axial collection or mass lesion/mass effect.

Vascular: No hyperdense vessel or unexpected calcification.

Skull: Normal. Negative for fracture or focal lesion.

Sinuses/Orbits: No acute finding.
IMPRESSION: Negative head CT.

## 2020-07-09 MED ORDER — ACETAMINOPHEN 500 MG PO TABS
1000.0000 mg | ORAL_TABLET | Freq: Once | ORAL | Status: AC
  Administered 2020-07-09: 1000 mg via ORAL
  Filled 2020-07-09: qty 2

## 2020-07-09 MED ORDER — LORAZEPAM 1 MG PO TABS
1.0000 mg | ORAL_TABLET | Freq: Once | ORAL | Status: AC
  Administered 2020-07-09: 1 mg via ORAL
  Filled 2020-07-09: qty 1

## 2020-07-09 MED ORDER — LACTATED RINGERS IV BOLUS
1000.0000 mL | Freq: Once | INTRAVENOUS | Status: AC
  Administered 2020-07-09: 1000 mL via INTRAVENOUS

## 2020-07-09 MED ORDER — GADOBUTROL 1 MMOL/ML IV SOLN
10.0000 mL | Freq: Once | INTRAVENOUS | Status: AC | PRN
  Administered 2020-07-09: 10 mL via INTRAVENOUS

## 2020-07-09 NOTE — ED Notes (Addendum)
Pt to CT/MRI  at this time

## 2020-07-09 NOTE — ED Notes (Signed)
Patient transported to CT scan at this time.

## 2020-07-09 NOTE — ED Provider Notes (Addendum)
[facility] Emergency Department Provider Note  ____________________________________________  Time seen: Approximately 6:44 AM  I have reviewed the triage vital signs and the nursing notes.   HISTORY  Chief Complaint Dizziness   HPI Mary Henson is a 54 y.o. female with a history of bipolar disorder, hypertension, OSA, unruptured brain aneurysm who presents for evaluation of a headache.   Patient reports that her symptoms started last night at midnight while patient was laying in bed.  She reports feeling extremely fatigued, started feeling dizzy which she describes as swimmy headed and started with a headache.  She denies headache or severe headache.  She reports that her headache is currently the worst is ever been and its only a 4 out of 10.  Patient reports being diagnosed with a brain aneurysm over 10 years ago in [place].  She presented at that time with thunderclap headache and was found to have an unruptured aneurysm.  She has never undergone any procedures for it.  She reports his headache is much much milder and nothing similar to that one.  It is mostly on the left side and throbbing.  She denies fever or chills.  She thought she was dehydrated so she started drinking more fluids.  She denies cough or congestions.  She denies chest pain or shortness of breath, abdominal pain, nausea, vomiting or diarrhea.  Patient has been vaccinated for Covid and denies any known exposures.   Past Medical History:  Diagnosis Date  . Bipolar disorder ([place])   . Hypertension   . Sleep apnea     There are no problems to display for this patient.   History reviewed. No pertinent surgical history.  Prior to Admission medications   Not on File    Allergies Lamotrigine, Penicillins, Clindamycin/lincomycin, Hydrochlorothiazide, Other, and Amiloride hydrochloride dihydrate [amiloride]  No family history on file.  Social History Social History   Tobacco Use  .  Smoking status: Never Smoker  . Smokeless tobacco: Never Used  Vaping Use  . Vaping Use: Never used  Substance Use Topics  . Alcohol use: Never  . Drug use: Never    Review of Systems  Constitutional: Negative for fever. + Dizziness and fatigue Eyes: Negative for visual changes. ENT: Negative for sore throat. Neck: No neck pain  Cardiovascular: Negative for chest pain. Respiratory: Negative for shortness of breath. Gastrointestinal: Negative for abdominal pain, vomiting or diarrhea. Genitourinary: Negative for dysuria. Musculoskeletal: Negative for back pain. Skin: Negative for rash. Neurological: Negative for weakness or numbness. + HA Psych: No SI or HI  ____________________________________________   PHYSICAL EXAM:  VITAL SIGNS: ED Triage Vitals [07/09/20 0355]  Enc Vitals Group     BP (!) 179/82     Pulse Rate 96     Resp 18     Temp 98.2 F (36.8 C)     Temp Source Oral     SpO2 98 %     Weight 211 lb (95.7 kg)     Height 5\' 4"  (1.626 m)     Head Circumference      Peak Flow      Pain Score 0     Pain Loc      Pain Edu?      Excl. in [place]?     Constitutional: Alert and oriented. Well appearing and in no apparent distress. HEENT:      Head: Normocephalic and atraumatic.         Eyes: Conjunctivae are normal. Sclera  is non-icteric.       Mouth/Throat: Mucous membranes are moist.       Neck: Supple with no signs of meningismus. Cardiovascular: Regular rate and rhythm. No murmurs, gallops, or rubs.  Respiratory: Normal respiratory effort. Lungs are clear to auscultation bilaterally.  Gastrointestinal: Soft, non tender, and non distended. Musculoskeletal:  No edema, cyanosis, or erythema of extremities. Neurologic: Normal speech and language. Face is symmetric. EOMI, PERRL, normal strength and sensation x4, no dysmetria, no pronator drift, normal gait Skin: Skin is warm, dry and intact. No rash noted. Psychiatric: Mood and affect are normal. Speech and  behavior are normal.  ____________________________________________   LABS (all labs ordered are listed, but only abnormal results are displayed)  Labs Reviewed  COMPREHENSIVE METABOLIC PANEL - Abnormal; Notable for the following components:      Result Value   Glucose, Bld 115 (*)    Calcium 8.8 (*)    All other components within normal limits  CBC - Abnormal; Notable for the following components:   Hemoglobin 11.8 (*)    All other components within normal limits  URINALYSIS, COMPLETE (UACMP) WITH MICROSCOPIC - Abnormal; Notable for the following components:   Color, Urine STRAW (*)    APPearance CLEAR (*)    Specific Gravity, Urine 1.003 (*)    Hgb urine dipstick SMALL (*)    Leukocytes,Ua TRACE (*)    Bacteria, UA RARE (*)    All other components within normal limits  RESPIRATORY PANEL BY RT PCR (FLU A&B, COVID)  POC URINE PREG, ED  TROPONIN I (HIGH SENSITIVITY)  TROPONIN I (HIGH SENSITIVITY)   ____________________________________________  EKG  ED ECG REPORT I, Rudene Re, the attending physician, personally viewed and interpreted this ECG.  Normal sinus rhythm, rate of 81, normal intervals, normal axis, no ST elevations or depressions.  No significant changes when compared to prior. ____________________________________________  RADIOLOGY  CLINICAL DATA: Central vertigo  EXAM: CT HEAD WITHOUT CONTRAST  TECHNIQUE: Contiguous axial images were obtained from the base of the skull through the vertex without intravenous contrast.  COMPARISON: None.  FINDINGS: Brain: No evidence of acute infarction, hemorrhage, hydrocephalus, extra-axial collection or mass lesion/mass effect.  Vascular: No hyperdense vessel or unexpected calcification.  Skull: Normal. Negative for fracture or focal lesion.  Sinuses/Orbits: No acute finding.  IMPRESSION: Negative head CT.   Electronically Signed By: Monte Fantasia M.D. On: 07/09/2020 05:54    ____________________________________________   PROCEDURES  Procedure(s) performed:yes .1-3 Lead EKG Interpretation Performed by: Rudene Re, MD Authorized by: Rudene Re, MD     Interpretation: non-specific     ECG rate assessment: normal     Rhythm: sinus rhythm     Ectopy: none     Critical Care performed:  None ____________________________________________   INITIAL IMPRESSION / ASSESSMENT AND PLAN / ED COURSE  54 y.o. female with a history of bipolar disorder, hypertension, OSA, unruptured brain aneurysm who presents for evaluation of a headache, fatigue, dizziness.  Patient is extremely well-appearing in no distress with normal vitals, neurologically intact.  Patient reports being diagnosed with an unruptured brain aneurysm over 10 years ago in [place] when she had an episode of thunderclap headache.  She reports that this headache is nothing like that 1 and is much more mild in intensity.  At its worst the headache was 4 out of 10.  No nausea or vomiting.  Review of old medical records show that patient did have repeat imaging in 2018 showing a 3 mm intracranial aneurysm.  Head CT done within 6 hours of onset of symptoms and visualized by me with no signs of bleeding, confirmed by radiology.  We will get an MRI and MRA of the head to evaluate the aneurysm and rule out subarachnoid hemorrhage.  Will test for Covid since patient has had fatigue and dizziness as well.  EKG visualized by me with no signs of ischemia.  Labs visualized by me with no acute findings.  Will give IV fluids for possible dehydration Tylenol for pain.  Will monitor patient closely telemetry.     _________________________ 7:23 AM on 07/09/2020 -----------------------------------------  MRI and Covid pending. Care transferred to Dr. Kerman Passey  _____________________________________________ Please note:  Patient was evaluated in Emergency Department today for the symptoms described in  the history of present illness. Patient was evaluated in the context of the global COVID-19 pandemic, which necessitated consideration that the patient might be at risk for infection with the SARS-CoV-2 virus that causes COVID-19. Institutional protocols and algorithms that pertain to the evaluation of patients at risk for COVID-19 are in a state of rapid change based on information released by regulatory bodies including the CDC and federal and state organizations. These policies and algorithms were followed during the patient's care in the ED.  Some ED evaluations and interventions may be delayed as a result of limited staffing during the pandemic.   [place] Controlled Substance Database was reviewed by me. ____________________________________________   FINAL CLINICAL IMPRESSION(S) / ED DIAGNOSES   Final diagnoses:  Dizziness  Acute nonintractable headache, unspecified headache type      NEW MEDICATIONS STARTED DURING THIS VISIT:  ED Discharge Orders    None       Note:  This document was prepared using Dragon voice recognition software and may include unintentional dictation errors.    Alfred Levins, [place], MD 07/09/20 [place], [place], MD 07/09/20 [phone]

## 2020-07-09 NOTE — Discharge Instructions (Addendum)
Please follow-up with your doctor regarding her known aneurysms were continued and routine follow-up.  Return to the emergency department for any sudden severe headache, or any other symptom personally concerning to yourself.

## 2020-07-09 NOTE — ED Notes (Signed)
Pt back from CT/MRI at this time

## 2020-07-09 NOTE — ED Notes (Signed)
Pt ambulatory to room w steady gait. Pt denies dizziness at this time, has 3/10 headache on L side. Denies n/v/d, fevers. Pt states "tonight around midnight she felt fatigued, dizzy, dehydrated and had headache. She drank plenty of water then had frequent urination, denies burning."

## 2020-07-09 NOTE — ED Provider Notes (Signed)
-----------------------------------------   12:12 PM on 07/09/2020 -----------------------------------------  MRIs have resulted showing multiple aneurysms but no acute abnormality.  No hemorrhage.  Patient appears to be feeling well and is requesting food.  We will discharge from the emergency department discussed aneurysm follow-up.  Discussed return precautions.   Harvest Dark, MD 07/09/20 1213

## 2020-07-09 NOTE — ED Notes (Signed)
Pt ambulated down hall to bathroom at this time, tolerated well.

## 2020-07-09 NOTE — ED Triage Notes (Signed)
Patient ambulatory to triage with steady gait, without difficulty or distress noted; pt reports dizziness, feeling "tired, fatigued" and increased urination tonight

## 2020-07-09 NOTE — ED Notes (Addendum)
Pt still in CT/MRI  at this time

## 2020-07-13 ENCOUNTER — Other Ambulatory Visit: Payer: Self-pay

## 2020-07-13 ENCOUNTER — Emergency Department: Payer: BC Managed Care – PPO

## 2020-07-13 ENCOUNTER — Emergency Department
Admission: EM | Admit: 2020-07-13 | Discharge: 2020-07-13 | Disposition: A | Discharge status: Home / Self Care | Payer: BC Managed Care – PPO | Attending: Emergency Medicine | Admitting: Emergency Medicine

## 2020-07-13 DIAGNOSIS — Y92009 Unspecified place in unspecified non-institutional (private) residence as the place of occurrence of the external cause: Secondary | ICD-10-CM | POA: Insufficient documentation

## 2020-07-13 DIAGNOSIS — Y9301 Activity, walking, marching and hiking: Secondary | ICD-10-CM | POA: Insufficient documentation

## 2020-07-13 DIAGNOSIS — I10 Essential (primary) hypertension: Secondary | ICD-10-CM | POA: Insufficient documentation

## 2020-07-13 DIAGNOSIS — W228XXA Striking against or struck by other objects, initial encounter: Secondary | ICD-10-CM | POA: Insufficient documentation

## 2020-07-13 DIAGNOSIS — S060X0A Concussion without loss of consciousness, initial encounter: Secondary | ICD-10-CM | POA: Diagnosis not present

## 2020-07-13 DIAGNOSIS — S0990XA Unspecified injury of head, initial encounter: Secondary | ICD-10-CM | POA: Diagnosis present

## 2020-07-13 IMAGING — CT CT HEAD W/O CM
4 series · 16 of 47 positions shown, 18 images · non-contrast
Comparison: CT head without contrast. MR head without and with
contrast and MRA head [DATE]

CLINICAL DATA: Head trauma. Patient walked into a door frame while
walking with a lights off. Dizziness. Slurred speech.

EXAM:
CT HEAD WITHOUT CONTRAST
TECHNIQUE: Contiguous axial images were obtained from the base of the skull
through the vertex without intravenous contrast.

[Series 2: head wo · axial · 0.43mm/px · z∈[-76,+34]mm · 7 of 30 slices shown, 9 images]
[im 4/30  brain]
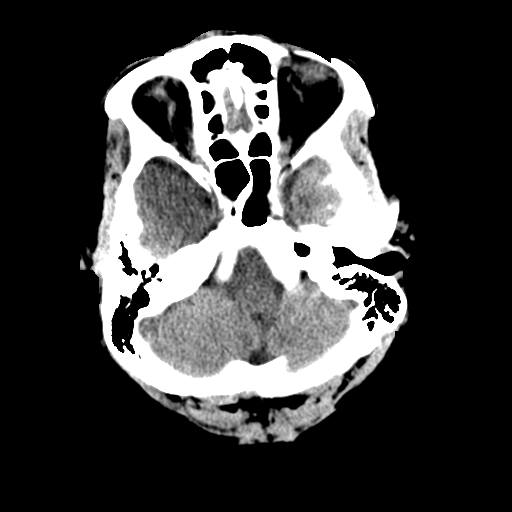
[im 4/30  bone]
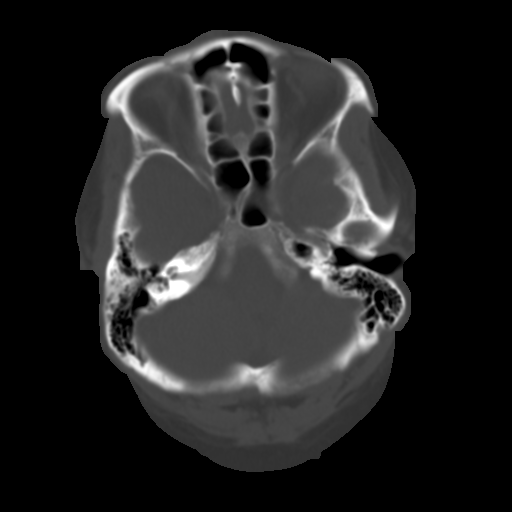
[im 8/30  brain]
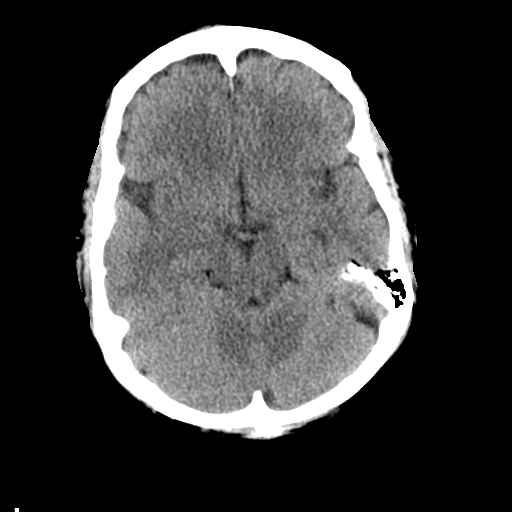
[im 11/30  brain]
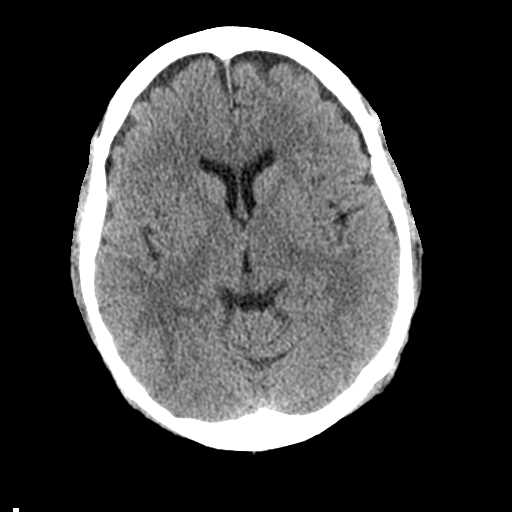
[im 15/30  brain]
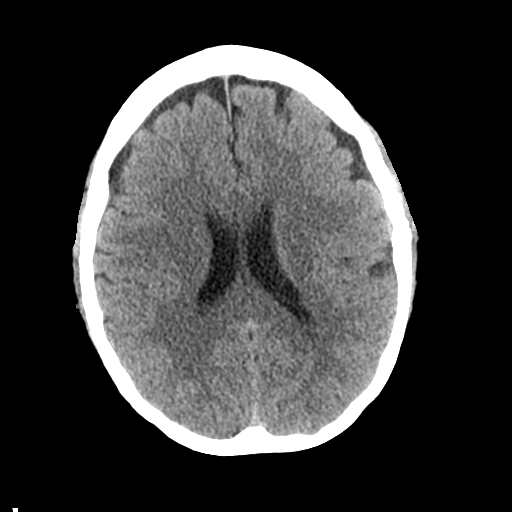
[im 19/30  brain]
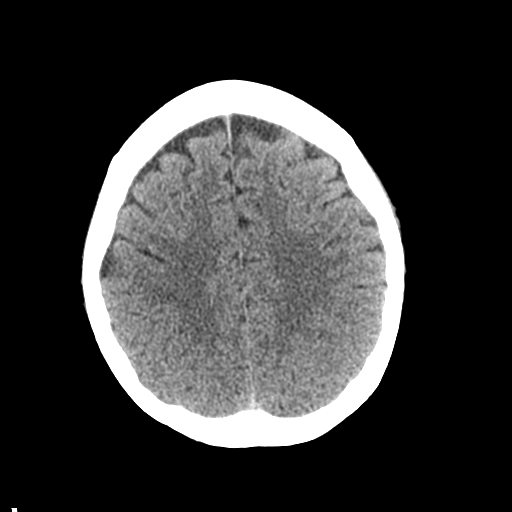
[im 19/30  bone]
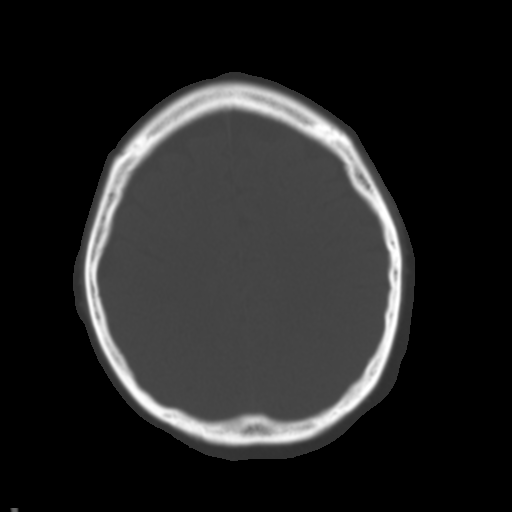
[im 22/30  brain]
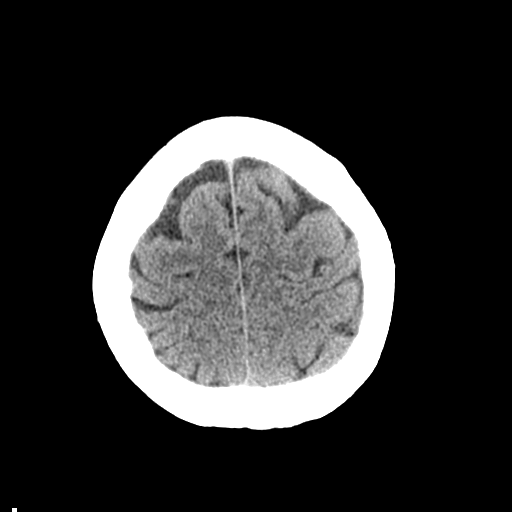
[im 26/30  brain]
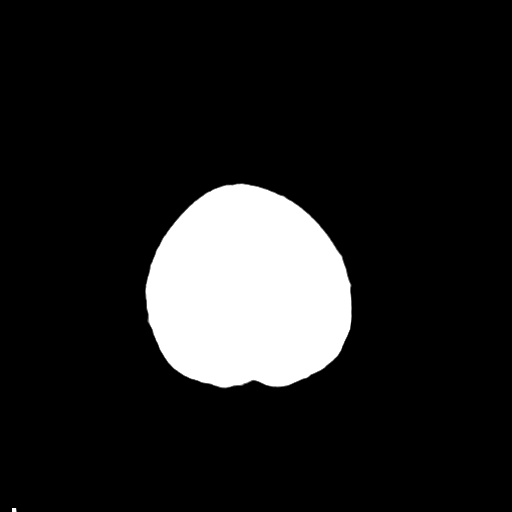

[Series 3: head bone · axial · 0.43mm/px · z∈[-77,-47]mm · 3 of 75 slices shown]
[im 8/75  bone]
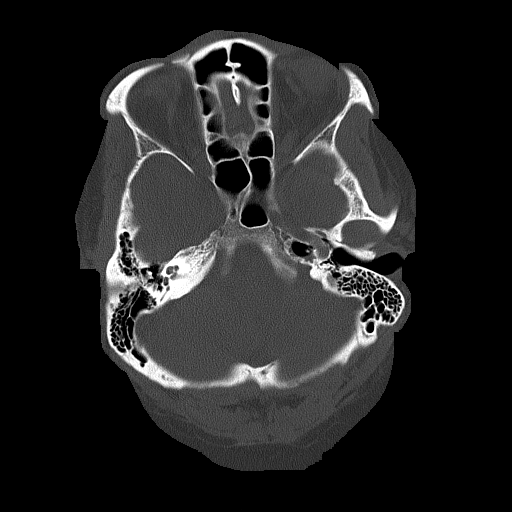
[im 15/75  bone]
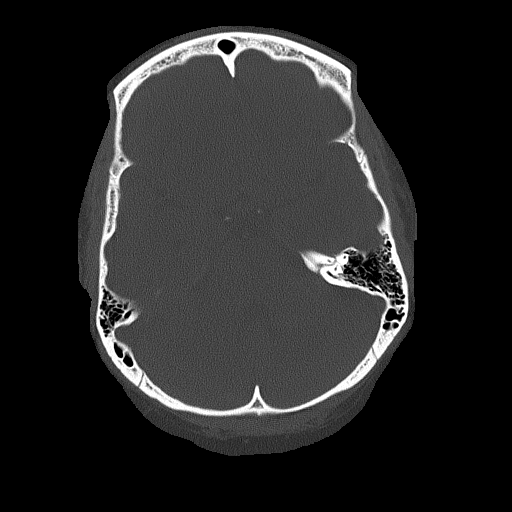
[im 23/75  bone]
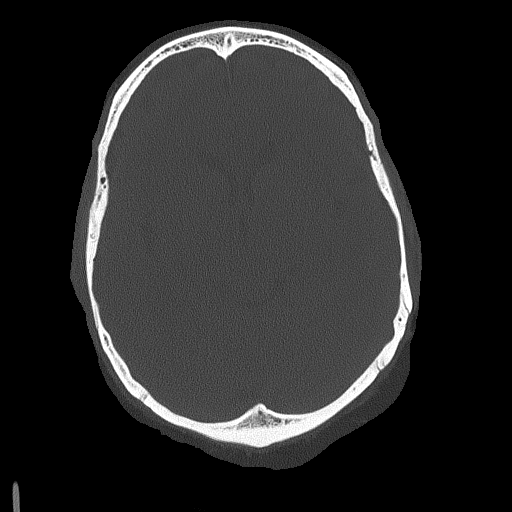

[Series 4: coronal soft tissue · coronal · 0.29mm/px · 3 of 64 slices shown]
[im 22/64  brain]
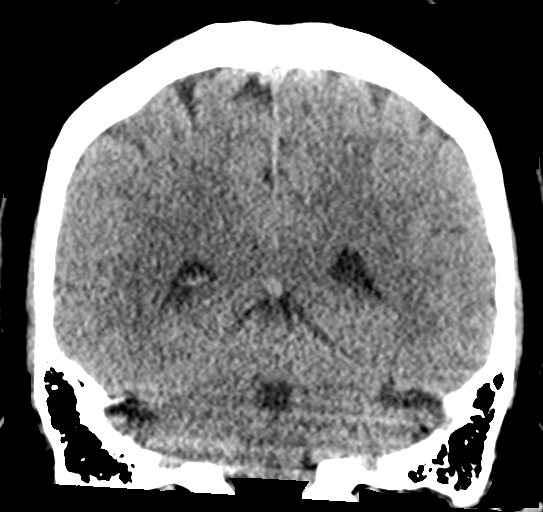
[im 29/64  brain]
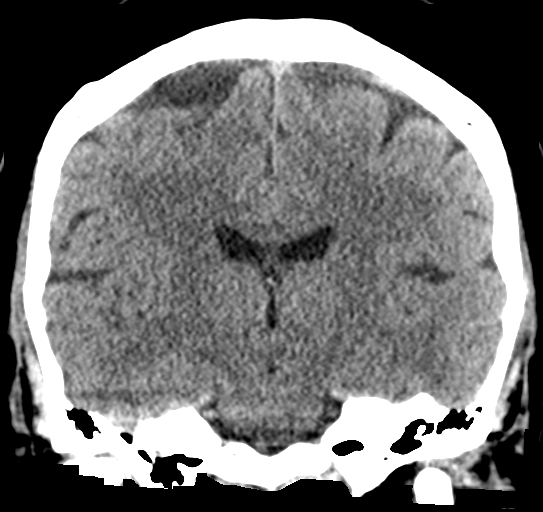
[im 36/64  brain]
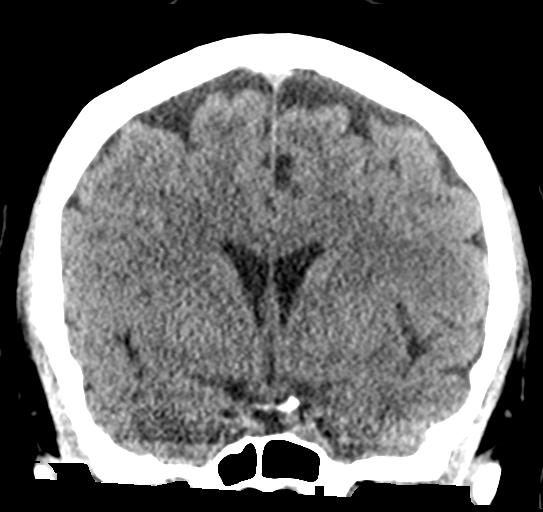

[Series 5: sagittal soft tissue · sagittal · 0.29mm/px · 3 of 53 slices shown]
[im 18/53  brain]
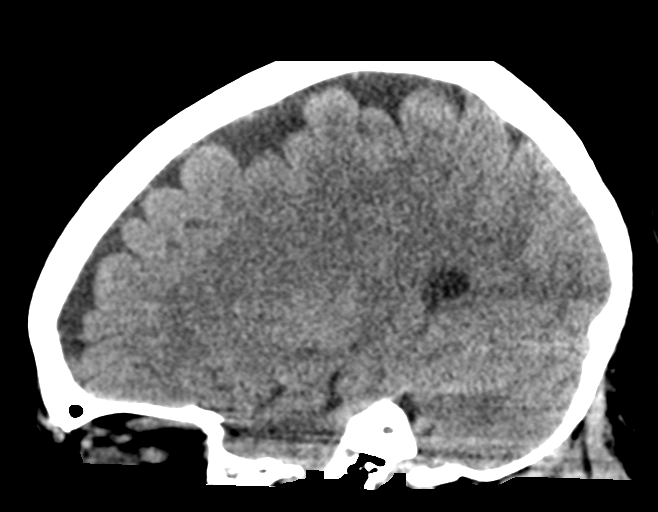
[im 27/53  brain]
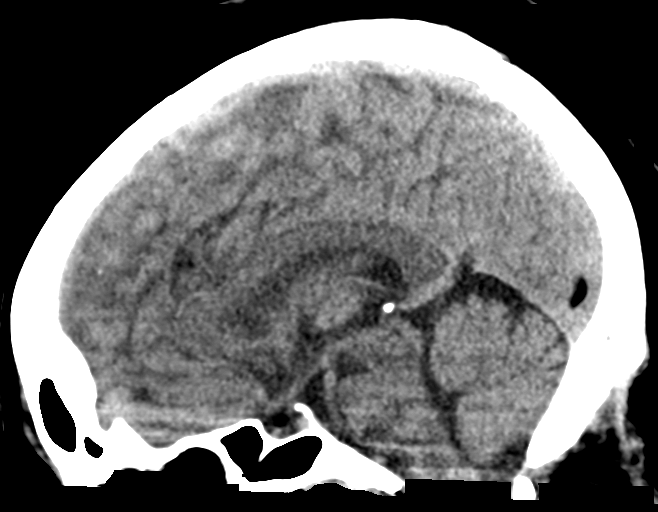
[im 35/53  brain]
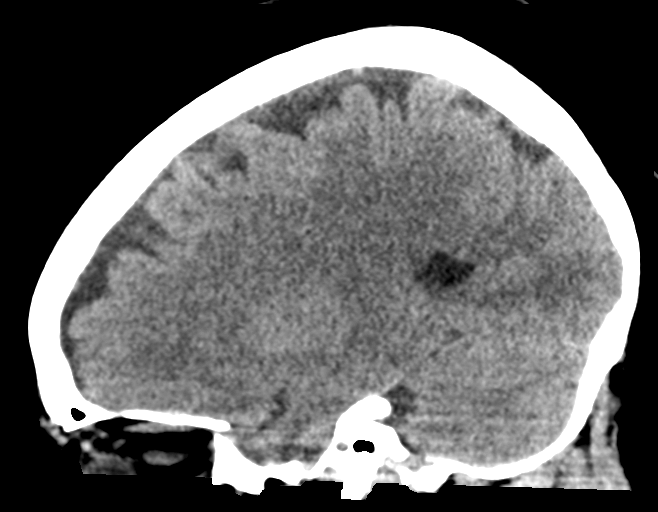

[16 of 47 positions shown; findings below may reference images not displayed]

FINDINGS: Brain: No acute infarct, hemorrhage, or mass lesion is present. No
significant white matter lesions are present. The ventricles are of
normal size. No significant extraaxial fluid collection is present.

Vascular: Narrowing small aneurysms are not visualized by CT. No
significant vascular calcifications are present. No hyperdense
vessel is present.

Skull: Right supraorbital frontal scalp soft tissue swelling and
hematoma is present without underlying fracture. No significant
laceration or foreign body is present. Extracranial soft tissues are
otherwise within limits. Calvarium is intact.

Sinuses/Orbits: The paranasal sinuses and mastoid air cells are
clear. The globes and orbits are within normal limits.
IMPRESSION: 1. Right supraorbital frontal scalp soft tissue swelling and
hematoma without underlying fracture.
2. Normal CT appearance of the brain.

## 2020-07-13 NOTE — ED Notes (Signed)
Dr. Cinda Quest, MD at bedside at this time.

## 2020-07-13 NOTE — ED Triage Notes (Signed)
Patient reports she was walking in the dark and walked into the door frame and hit her head.  Patient reports feeling dizzy and that her speech is slurred.

## 2020-07-13 NOTE — ED Notes (Addendum)
Pt ambulated steadily to restroom in hallway. Continuing to assess gait for changes as reported earlier

## 2020-07-13 NOTE — ED Provider Notes (Addendum)
[facility] Emergency Department Provider Note   ____________________________________________   First MD Initiated Contact with Patient 07/13/20 [phone]     (approximate)  I have reviewed the triage vital signs and the nursing notes.   HISTORY  Chief Complaint Head Injury   HPI Mary Henson is a 54 y.o. female who reports she was walking in her house last night in the dark and ran into the door frame.  She felt dizzy and reported her speech was somewhat slurred initially.  She had a headache.  Currently she has a lump over her right eye involving the eyebrow about the size of a quarter.  It hurts there but she does not have any other headache.  Her speech is back to normal her vision is normal she is not feeling dizzy any longer.        Past Medical History:  Diagnosis Date  . Bipolar disorder ([place])   . Hypertension   . Sleep apnea   Also has a history of brain aneurysms which were last seen on MRI done 11 days ago.  There are no problems to display for this patient.   No past surgical history on file.  Prior to Admission medications   Not on File    Allergies Lamotrigine, Penicillins, Clindamycin/lincomycin, Hydrochlorothiazide, Other, and Amiloride hydrochloride dihydrate [amiloride]  No family history on file.  Social History Social History   Tobacco Use  . Smoking status: Never Smoker  . Smokeless tobacco: Never Used  Vaping Use  . Vaping Use: Never used  Substance Use Topics  . Alcohol use: Never  . Drug use: Never    Review of Systems Currently Constitutional: No fever/chills Eyes: No visual changes. ENT: No sore throat. Cardiovascular: Denies chest pain. Respiratory: Denies shortness of breath. Gastrointestinal: No abdominal pain.  No nausea, no vomiting.  No diarrhea.  No constipation. Genitourinary: Negative for dysuria. Musculoskeletal: Negative for back pain. Skin: Negative for rash. Neurological: Negative for focal  weakness   ____________________________________________   PHYSICAL EXAM:  VITAL SIGNS: ED Triage Vitals [07/13/20 0333]  Enc Vitals Group     BP 136/67     Pulse Rate (!) 110     Resp 18     Temp 98.7 F (37.1 C)     Temp Source Oral     SpO2 96 %     Weight 208 lb (94.3 kg)     Height 5\' 4"  (1.626 m)     Head Circumference      Peak Flow      Pain Score 7     Pain Loc      Pain Edu?      Excl. in [place]?     Constitutional: Alert and oriented. Well appearing and in no acute distress. Eyes: Conjunctivae are normal. PER. EOMI. Head: Atraumatic except for lump as described in HPI. Nose: No congestion/rhinnorhea. Mouth/Throat: Mucous membranes are moist.  Oropharynx non-erythematous. Neck: No stridor.  Cardiovascular: Normal rate, regular rhythm. Grossly normal heart sounds.  Good peripheral circulation. Respiratory: Normal respiratory effort.  No retractions. Lungs CTAB. Gastrointestinal: Soft and nontender. No distention. No abdominal bruits.  Musculoskeletal: No lower extremity tenderness nor edema.   Neurologic:  Normal speech and language. No gross focal neurologic deficits are appreciated.  Cranial nerves II through XII are intact although visual fields were not checked.  Cerebellar finger-to-nose rapid alternating movements in the hands and toe to my hand are normal.  Motor strength is 5/5 throughout and  patient does not report any numbness. Skin:  Skin is warm, dry and intact. No rash noted.  ____________________________________________   LABS (all labs ordered are listed, but only abnormal results are displayed)  Labs Reviewed - No data to display ____________________________________________  EKG   ____________________________________________  RADIOLOGY Gertha Calkin, personally viewed and evaluated these images (plain radiographs) as part of my medical decision making, as well as reviewing the written report by the radiologist.  ED MD interpretation:  CT read by radiology reviewed by me is negative  Official radiology report(s): CT Head Wo Contrast  Result Date: 07/13/2020 CLINICAL DATA:  Head trauma. Patient walked into a door frame while walking with a lights off. Dizziness. Slurred speech. EXAM: CT HEAD WITHOUT CONTRAST TECHNIQUE: Contiguous axial images were obtained from the base of the skull through the vertex without intravenous contrast. COMPARISON:  CT head without contrast. MR head without and with contrast and MRA head 07/09/2020 FINDINGS: Brain: No acute infarct, hemorrhage, or mass lesion is present. No significant white matter lesions are present. The ventricles are of normal size. No significant extraaxial fluid collection is present. Vascular: Narrowing small aneurysms are not visualized by CT. No significant vascular calcifications are present. No hyperdense vessel is present. Skull: Right supraorbital frontal scalp soft tissue swelling and hematoma is present without underlying fracture. No significant laceration or foreign body is present. Extracranial soft tissues are otherwise within limits. Calvarium is intact. Sinuses/Orbits: The paranasal sinuses and mastoid air cells are clear. The globes and orbits are within normal limits. IMPRESSION: 1. Right supraorbital frontal scalp soft tissue swelling and hematoma without underlying fracture. 2. Normal CT appearance of the brain. Electronically Signed   By: San Morelle M.D.   On: 07/13/2020 04:58    ____________________________________________   PROCEDURES  Procedure(s) performed (including Critical Care):  Procedures   ____________________________________________   INITIAL IMPRESSION / ASSESSMENT AND PLAN / ED COURSE  Patient is currently back to baseline although her symptoms are consistent with a mild concussion.  I will give her head injury instructions.  She will get a night light so she does not do this again.              ____________________________________________   FINAL CLINICAL IMPRESSION(S) / ED DIAGNOSES  Final diagnoses:  Concussion without loss of consciousness, initial encounter     ED Discharge Orders    None      *Please note:  Mary Henson was evaluated in Emergency Department on 07/13/2020 for the symptoms described in the history of present illness. She was evaluated in the context of the global COVID-19 pandemic, which necessitated consideration that the patient might be at risk for infection with the SARS-CoV-2 virus that causes COVID-19. Institutional protocols and algorithms that pertain to the evaluation of patients at risk for COVID-19 are in a state of rapid change based on information released by regulatory bodies including the CDC and federal and state organizations. These policies and algorithms were followed during the patient's care in the ED.  Some ED evaluations and interventions may be delayed as a result of limited staffing during and the pandemic.*   Note:  This document was prepared using Dragon voice recognition software and may include unintentional dictation errors.    Nena Polio, MD 07/13/20 [phone] Addendum is that patient has no sign of injury or hemorrhage on CT scan and headache was not severe there is no suspicion currently of any aneurysmal bleed.   Nena Polio,  MD 07/13/20 [phone]

## 2020-07-13 NOTE — ED Notes (Signed)
Transported pt to room. Pt stated she was able to walk w/o assistance. Stumbled multiple times walking to bed. Tried to put rails up on both sides to prevent pt from falling pt refused both side rails up.

## 2020-07-13 NOTE — Discharge Instructions (Addendum)
Please return for any worsening of symptoms.  Follow-up with your doctor in a day or 2 unless you are completely well, in that case you can follow-up in about a week.

## 2020-09-12 ENCOUNTER — Other Ambulatory Visit: Payer: Self-pay

## 2020-09-12 ENCOUNTER — Emergency Department: Payer: BC Managed Care – PPO

## 2020-09-12 ENCOUNTER — Encounter: Payer: Self-pay | Admitting: Emergency Medicine

## 2020-09-12 DIAGNOSIS — I10 Essential (primary) hypertension: Secondary | ICD-10-CM | POA: Diagnosis not present

## 2020-09-12 DIAGNOSIS — E871 Hypo-osmolality and hyponatremia: Secondary | ICD-10-CM | POA: Insufficient documentation

## 2020-09-12 DIAGNOSIS — R002 Palpitations: Secondary | ICD-10-CM | POA: Diagnosis not present

## 2020-09-12 LAB — CBC
HCT: 35.6 % — ABNORMAL LOW (ref 36.0–46.0)
Hemoglobin: 12.1 g/dL (ref 12.0–15.0)
MCH: 29.4 pg (ref 26.0–34.0)
MCHC: 34 g/dL (ref 30.0–36.0)
MCV: 86.6 fL (ref 80.0–100.0)
Platelets: 234 10*3/uL (ref 150–400)
RBC: 4.11 MIL/uL (ref 3.87–5.11)
RDW: 12.2 % (ref 11.5–15.5)
WBC: 6.2 10*3/uL (ref 4.0–10.5)
nRBC: 0 % (ref 0.0–0.2)

## 2020-09-12 LAB — TROPONIN I (HIGH SENSITIVITY)
Troponin I (High Sensitivity): 4 ng/L (ref <18)
Troponin I (High Sensitivity): 5 ng/L (ref <18)

## 2020-09-12 LAB — BASIC METABOLIC PANEL
Anion gap: 12 (ref 5–15)
BUN: 12 mg/dL (ref 6–20)
CO2: 23 mmol/L (ref 22–32)
Calcium: 9 mg/dL (ref 8.9–10.3)
Chloride: 92 mmol/L — ABNORMAL LOW (ref 98–111)
Creatinine, Ser: 0.77 mg/dL (ref 0.44–1.00)
GFR, Estimated: >60 mL/min (ref >60)
Glucose, Bld: 133 mg/dL — ABNORMAL HIGH (ref 70–99)
Potassium: 3.7 mmol/L (ref 3.5–5.1)
Sodium: 127 mmol/L — ABNORMAL LOW (ref 135–145)

## 2020-09-12 LAB — POC URINE PREG, ED: Preg Test, Ur: NEGATIVE

## 2020-09-12 IMAGING — CR DG CHEST 2V
1 series · 2 of 2 positions shown · non-contrast
Comparison: Chest radiograph dated [DATE].

CLINICAL DATA: 54-year-old female with shortness of breath and
chest pain.

EXAM:
CHEST - 2 VIEW

[Series 1: dg chest 2 view · 0.14mm/px · 2 of 2 slices shown]
[im 1/2]
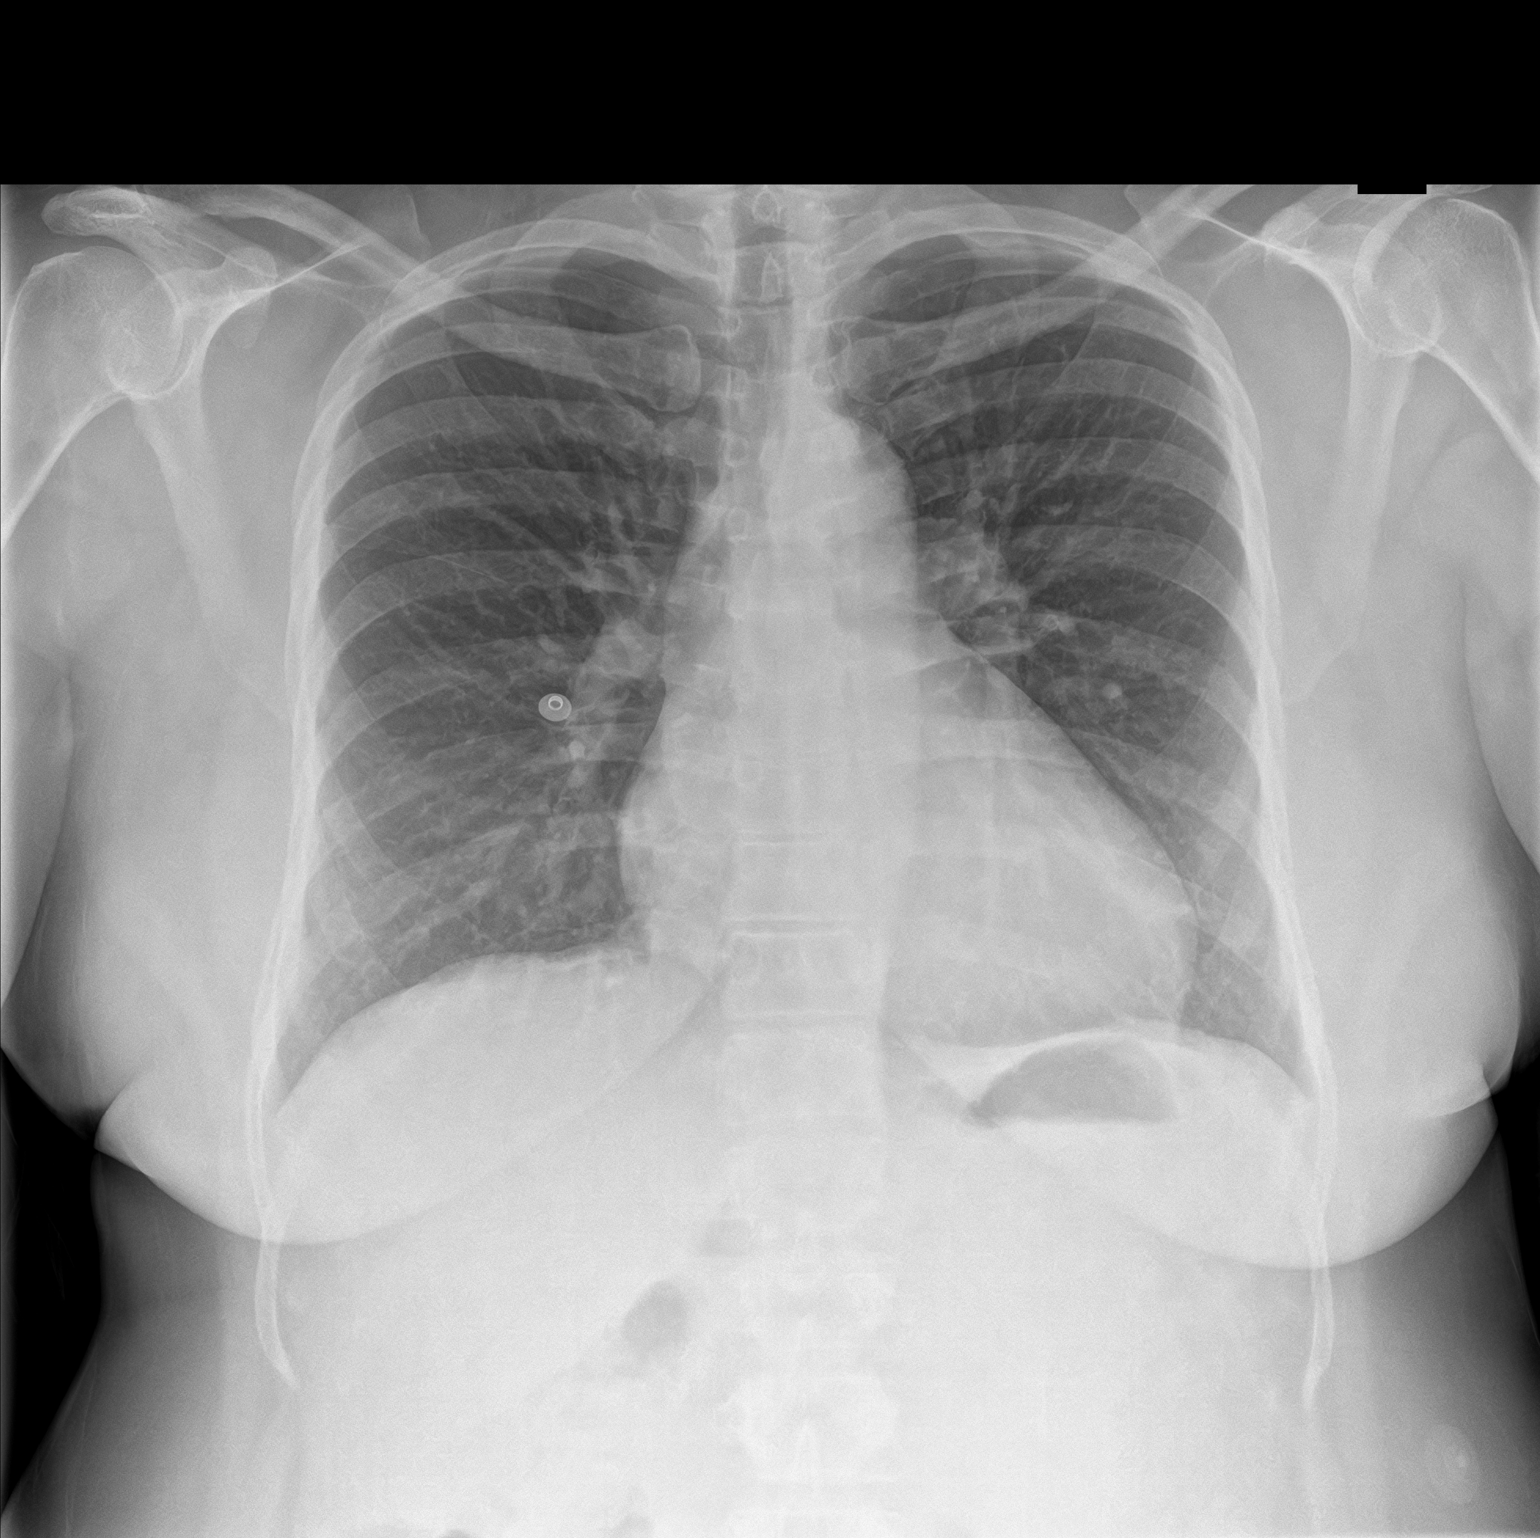
[im 2/2]
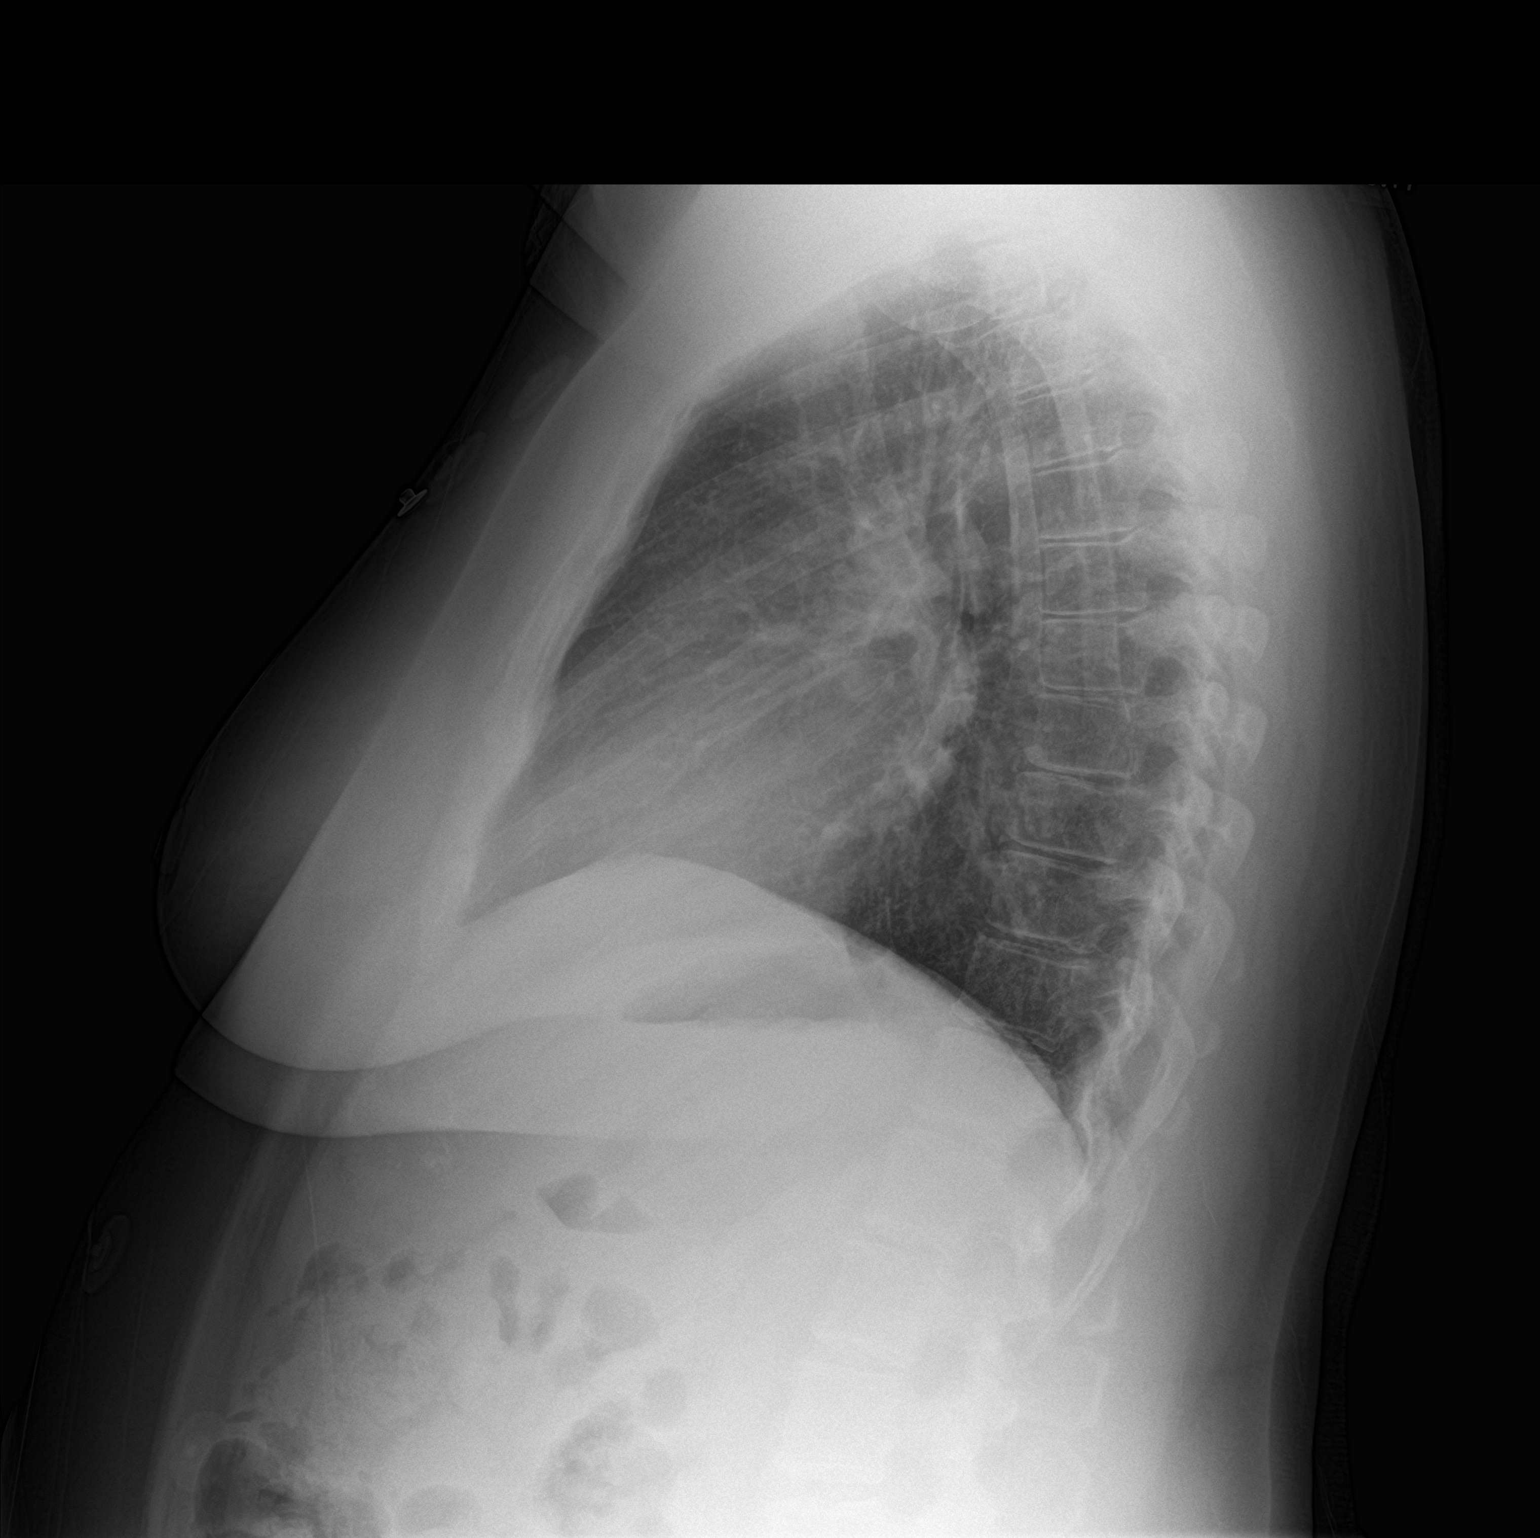

[2 of 2 positions shown; findings below may reference images not displayed]

FINDINGS: The heart size and mediastinal contours are within normal limits.
Both lungs are clear. The visualized skeletal structures are
unremarkable.
IMPRESSION: No active cardiopulmonary disease.

## 2020-09-12 NOTE — ED Triage Notes (Signed)
Pt via POV from home. Pt c/o palpitations, pt states that she felt like her heart is racing and intermittent CP and SOB, pt states that it only happens at night time. Pt states she started a fast on Monday. EMS was called to pts home on Wednesday and EMS states that she was dehydrated. Pt states she has been drinking more water but it has not helped. Pt is A&Ox4 and NAD.

## 2020-09-13 ENCOUNTER — Emergency Department
Admission: EM | Admit: 2020-09-13 | Discharge: 2020-09-13 | Disposition: A | Discharge status: Home / Self Care | Payer: BC Managed Care – PPO | Attending: Emergency Medicine | Admitting: Emergency Medicine

## 2020-09-13 DIAGNOSIS — R002 Palpitations: Secondary | ICD-10-CM

## 2020-09-13 DIAGNOSIS — E871 Hypo-osmolality and hyponatremia: Secondary | ICD-10-CM

## 2020-09-13 NOTE — ED Provider Notes (Signed)
[facility] Emergency Department Provider Note  ____________________________________________   Event Date/Time   First MD Initiated Contact with Patient 09/13/20 [phone]     (approximate)  I have reviewed the triage vital signs and the nursing notes.   HISTORY  Chief Complaint Palpitations    HPI Mary Henson is a 55 y.o. female with medical history as listed below who presents for evaluation of palpitations.  She said this has been happening for an extended period of time.  It seems always happen at night.  She said that she can be trying to sleep or wake up from sleeping and feel like her heart is racing.  Occasionally she will have some pain in her chest, but this has also been going on for extended period of time.  She believes that she saw a cardiologist about a year ago for the chest pain but she was not having palpitations at that time.  She said that she suffers from anxiety and wonders if that could contribute.  She states that she called the paramedics out several days ago at night when she was having the symptoms.  Because she had just completed a religious fast, they told her she was likely dehydrated.  She said as result she has been drinking a substantially increased amount of water during the day but it does not seem to help.  She is drinking 6 or more bottles of water a day in addition to her regular consumption.  She says she is eating and drinking normally now.  She is not using/drinking an excessive amount of caffeine.   She denies any drug use.  No new medications.  She said that she has sleep apnea and wonders if that might contribute to it, but she has no known history of arrhythmia or other cardiac issue.  She denies fever/chills, sore throat, nausea, vomiting, abdominal pain, dysuria, diarrhea, generalized weakness, and numbness/tingling.     Past Medical History:  Diagnosis Date  . Bipolar disorder ([place])   . Hypertension   . Sleep apnea      There are no problems to display for this patient.   Past Surgical History:  Procedure Laterality Date  . MYOMECTOMY    . UTERINE FIBROID EMBOLIZATION      Prior to Admission medications   Not on File    Allergies Lamotrigine, Penicillins, Clindamycin/lincomycin, Hydrochlorothiazide, Other, and Amiloride hydrochloride dihydrate [amiloride]  No family history on file.  Social History Social History   Tobacco Use  . Smoking status: Never Smoker  . Smokeless tobacco: Never Used  Vaping Use  . Vaping Use: Never used  Substance Use Topics  . Alcohol use: Never  . Drug use: Never    Review of Systems Constitutional: No fever/chills Eyes: No visual changes. ENT: No sore throat. Cardiovascular: Palpitations primarily at night, occasional chest pain. Respiratory: Denies shortness of breath. Gastrointestinal: No abdominal pain.  No nausea, no vomiting.  No diarrhea.  No constipation. Genitourinary: Negative for dysuria. Musculoskeletal: Negative for neck pain.  Negative for back pain. Integumentary: Negative for rash. Neurological: Negative for headaches, focal weakness or numbness.   ____________________________________________   PHYSICAL EXAM:  VITAL SIGNS: ED Triage Vitals  Enc Vitals Group     BP 09/12/20 1829 (!) 165/88     Pulse Rate 09/12/20 1829 72     Resp 09/12/20 1829 16     Temp 09/12/20 1829 99.3 F (37.4 C)     Temp Source 09/12/20 1829 Oral  SpO2 09/12/20 1829 96 %     Weight 09/12/20 1829 96.2 kg (212 lb)     Height 09/12/20 1829 1.626 m (5\' 4" )     Head Circumference --      Peak Flow --      Pain Score 09/12/20 1843 3     Pain Loc --      Pain Edu? --      Excl. in [place]? --     Constitutional: Alert and oriented.  Eyes: Conjunctivae are normal.  Head: Atraumatic. Nose: No congestion/rhinnorhea. Mouth/Throat: Patient is wearing a mask. Neck: No stridor.  No meningeal signs.   Cardiovascular: Normal rate, regular rhythm. Good  peripheral circulation. Respiratory: Normal respiratory effort.  No retractions. Gastrointestinal: Soft and nontender. No distention.  Musculoskeletal: No lower extremity tenderness nor edema. No gross deformities of extremities. Neurologic:  Normal speech and language. No gross focal neurologic deficits are appreciated.  Skin:  Skin is warm, dry and intact. Psychiatric: Mood and affect are anxious but generally appropriate.  ____________________________________________   LABS (all labs ordered are listed, but only abnormal results are displayed)  Labs Reviewed  BASIC METABOLIC PANEL - Abnormal; Notable for the following components:      Result Value   Sodium 127 (*)    Chloride 92 (*)    Glucose, Bld 133 (*)    All other components within normal limits  CBC - Abnormal; Notable for the following components:   HCT 35.6 (*)    All other components within normal limits  POC URINE PREG, ED  TROPONIN I (HIGH SENSITIVITY)  TROPONIN I (HIGH SENSITIVITY)   ____________________________________________  EKG  ED ECG REPORT I, Hinda Kehr, the attending physician, personally viewed and interpreted this ECG.  Date: 09/12/2020 EKG Time: 18:32 Rate: 73 Rhythm: normal sinus rhythm  QRS Axis: normal Intervals: normal ST/T Wave abnormalities: normal Narrative Interpretation: no evidence of acute ischemia  ____________________________________________  RADIOLOGY I, Hinda Kehr, personally viewed and evaluated these images (plain radiographs) as part of my medical decision making, as well as reviewing the written report by the radiologist.  ED MD interpretation: No acute abnormalities identified on chest x-[place]  Official radiology report(s): DG Chest 2 View  Result Date: 09/12/2020 CLINICAL DATA:  55 year old female with shortness of breath and chest pain. EXAM: CHEST - 2 VIEW COMPARISON:  Chest radiograph dated 05/12/2020. FINDINGS: The heart size and mediastinal contours are within  normal limits. Both lungs are clear. The visualized skeletal structures are unremarkable. IMPRESSION: No active cardiopulmonary disease. Electronically Signed   By: Anner Crete M.D.   On: 09/12/2020 19:16    ____________________________________________   PROCEDURES   Procedure(s) performed (including Critical Care):  Procedures   ____________________________________________   INITIAL IMPRESSION / MDM / [place] / ED COURSE  As part of my medical decision making, I reviewed the following data within the [facility] notes reviewed and incorporated, Labs reviewed , EKG interpreted , Old chart reviewed, Radiograph reviewed  and Notes from prior ED visits   Differential diagnosis includes, but is not limited to, nonspecific palpitations, electrolyte or metabolic abnormality, anxiety, SVT or other nonspecific cardiac arrhythmia, A. fib, less likely PE or ACS.  The patient has a low risk for ACS based on HEAR score.  Her vital signs are stable and within normal limits other than some hypertension.  She has had no tachycardia during more than 10 hours in the emergency department.  Her symptoms seem to only happen  at night and I suspect that her sleep apnea may play a role but this is difficult to assess from an emergency department perspective.  As a result of being told that she was dehydrated, she has been drinking an excessive amount of water and I believe that has led to some hyponatremia.  However she is asymptomatic from the hyponatremia and I think it is unlikely that hyponatremia is leading to the palpitations, particularly given that she started drinking the excess water because of her palpitations.  Because she is tolerating oral intake without difficulty, I encouraged that she switch to Gatorade, or even better, Pedialyte, which she has also been drinking in addition to the water.  I gave her information to read about hyponatremia but I do not  think she would benefit from IV fluids.  She is not in danger of seizure at this point and a slow correction is likely to be better for her than a rapid correction.  Her CBC is normal, urine pregnancy test is negative, basic metabolic panel unremarkable other than her hyponatremia, and her high-sensitivity troponin is normal.  No indication for repeat testing.   The patient ask if stress and anxiety could be contributing to her symptoms and I agree that that is a definite possibility.  Based on her reassuring lab work other than the mild hyponatremia, low risk for ACS, Wells score of 0, and no tachycardia or cardiac arrhythmia while in the emergency department, I think she would be appropriate for discharge and outpatient follow-up with cardiology.  I explained this to her and encouraged her to call to schedule follow-up appointment, discuss a Holter monitor, etc.  I gave my usual and customary return precautions and she understands and agrees with the plan.  ____________________________________________  FINAL CLINICAL IMPRESSION(S) / ED DIAGNOSES  Final diagnoses:  Palpitations  Hyponatremia     MEDICATIONS GIVEN DURING THIS VISIT:  Medications - No data to display   ED Discharge Orders    None      *Please note:  JADIA BARAN was evaluated in Emergency Department on 09/13/2020 for the symptoms described in the history of present illness. She was evaluated in the context of the global COVID-19 pandemic, which necessitated consideration that the patient might be at risk for infection with the SARS-CoV-2 virus that causes COVID-19. Institutional protocols and algorithms that pertain to the evaluation of patients at risk for COVID-19 are in a state of rapid change based on information released by regulatory bodies including the CDC and federal and state organizations. These policies and algorithms were followed during the patient's care in the ED.  Some ED evaluations and interventions may be  delayed as a result of limited staffing during and after the pandemic.*  Note:  This document was prepared using Dragon voice recognition software and may include unintentional dictation errors.   Hinda Kehr, MD 09/13/20 [phone]

## 2020-09-13 NOTE — Discharge Instructions (Addendum)
Your workup in the Emergency Department today was reassuring.  We did not find any specific abnormalities.  We recommend you drink plenty of fluids (preferably Gatorade or Pedialyte, since as we discussed, your sodium is a bit low), take your regular medications and/or any new ones prescribed today, and follow up with the doctor(s) listed in these documents as recommended.  Althought we are NOT giving you a diagnosis of SVT tonight, it is a possibility, and we gave you information to read about SVT to see if you feel like this is a good match for your symptoms.  Return to the Emergency Department if you develop new or worsening symptoms that concern you.

## 2020-09-13 NOTE — ED Notes (Signed)
Pt states she has been having "racing" heartrate since Tuesday at night. Pt states she was told by rescue squad that she might be dehydrated so she increased her po intake without alleviation of symptoms. Pt states when she sits up she does not notice tachycardia but she does have some pain in her chest when she sits up.

## 2020-10-05 ENCOUNTER — Emergency Department
Admission: EM | Admit: 2020-10-05 | Discharge: 2020-10-05 | Disposition: A | Discharge status: Home / Self Care | Payer: BC Managed Care – PPO | Attending: Emergency Medicine | Admitting: Emergency Medicine

## 2020-10-05 ENCOUNTER — Emergency Department: Payer: BC Managed Care – PPO

## 2020-10-05 ENCOUNTER — Other Ambulatory Visit: Payer: Self-pay

## 2020-10-05 DIAGNOSIS — S0990XA Unspecified injury of head, initial encounter: Secondary | ICD-10-CM | POA: Insufficient documentation

## 2020-10-05 DIAGNOSIS — W228XXA Striking against or struck by other objects, initial encounter: Secondary | ICD-10-CM | POA: Diagnosis not present

## 2020-10-05 DIAGNOSIS — I10 Essential (primary) hypertension: Secondary | ICD-10-CM | POA: Diagnosis not present

## 2020-10-05 IMAGING — CT CT HEAD W/O CM
3 series · 16 of 47 positions shown, 19 images · non-contrast
Comparison: CT head [DATE], MRI head [DATE]

CLINICAL DATA: Hit head on grand in counter top. Feels dizzy.
Little incoherent. History of aneurysms.

EXAM:
CT HEAD WITHOUT CONTRAST
TECHNIQUE: Contiguous axial images were obtained from the base of the skull
through the vertex without intravenous contrast.

[Series 2: head wo · axial · 0.45mm/px · z∈[-93,+52]mm · 10 of 35 slices shown, 13 images]
[im 3/35  brain]
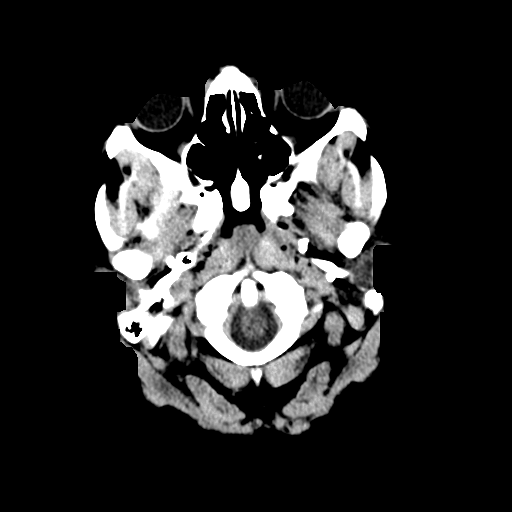
[im 3/35  bone]
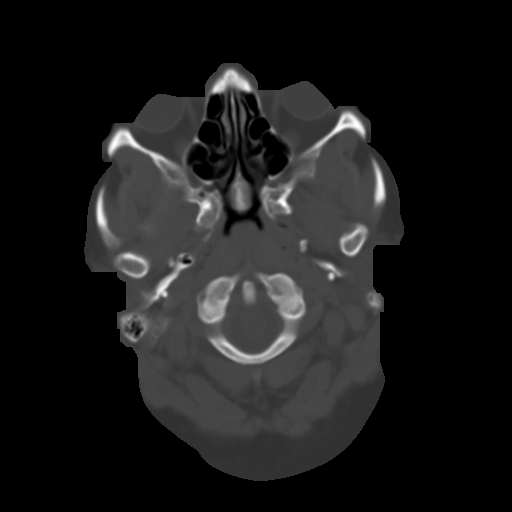
[im 6/35  brain]
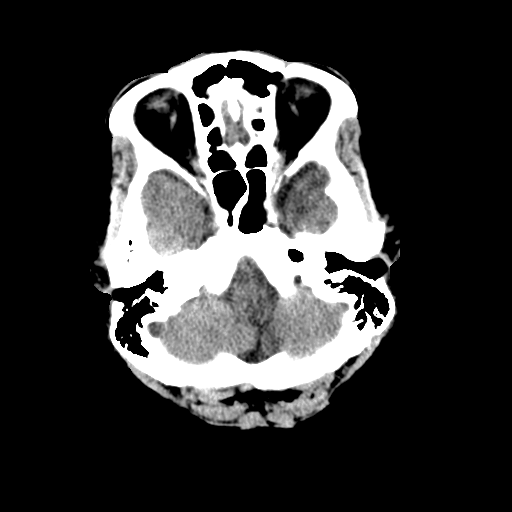
[im 10/35  brain]
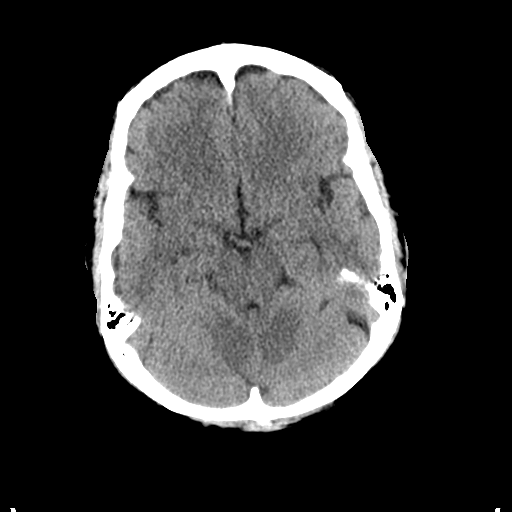
[im 12/35  brain]
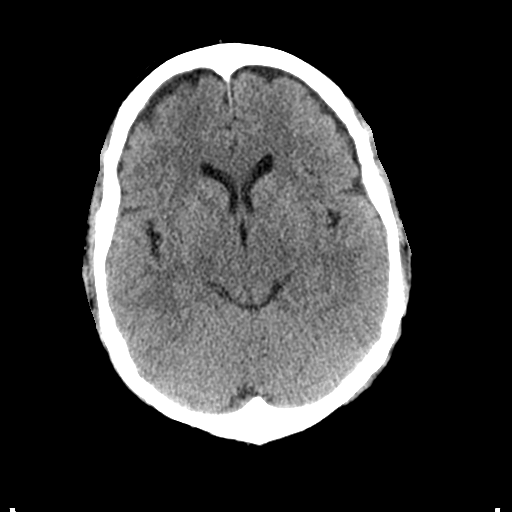
[im 16/35  brain]
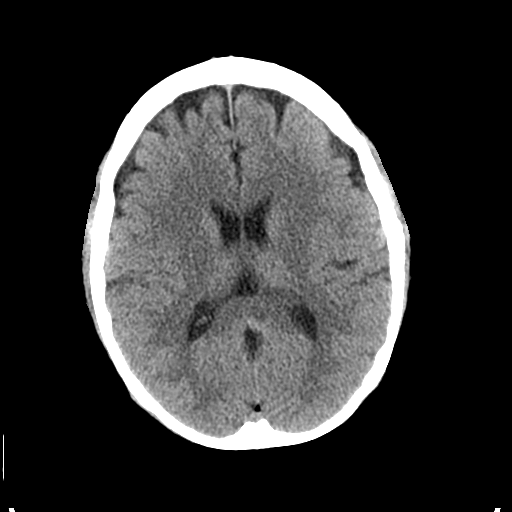
[im 16/35  bone]
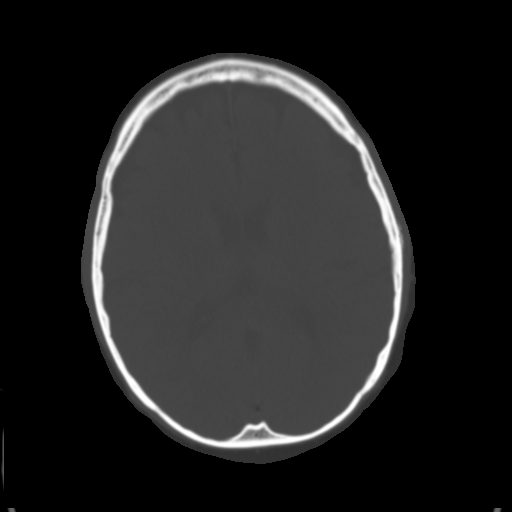
[im 19/35  brain]
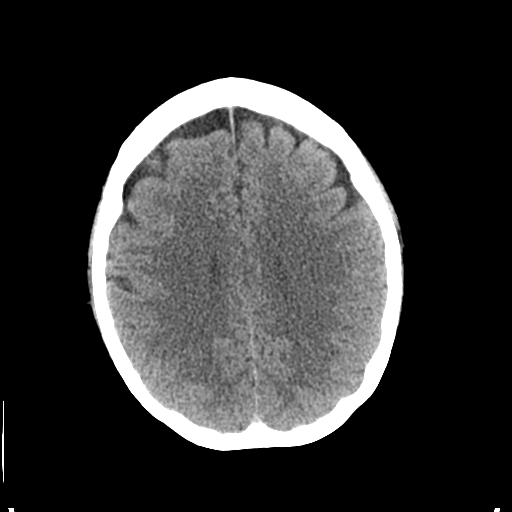
[im 23/35  brain]
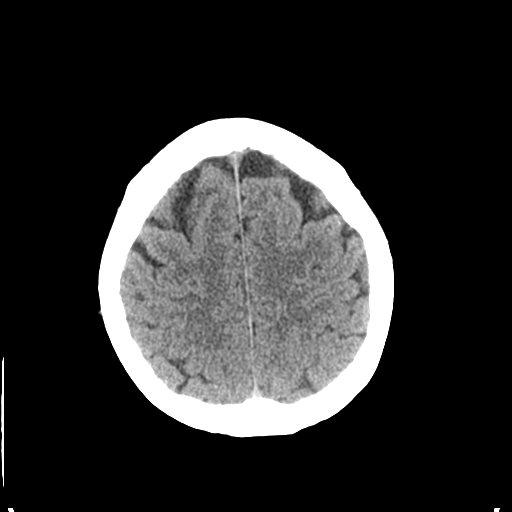
[im 26/35  brain]
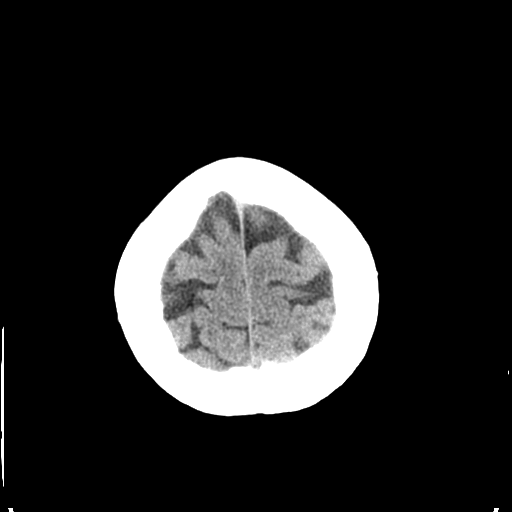
[im 29/35  brain]
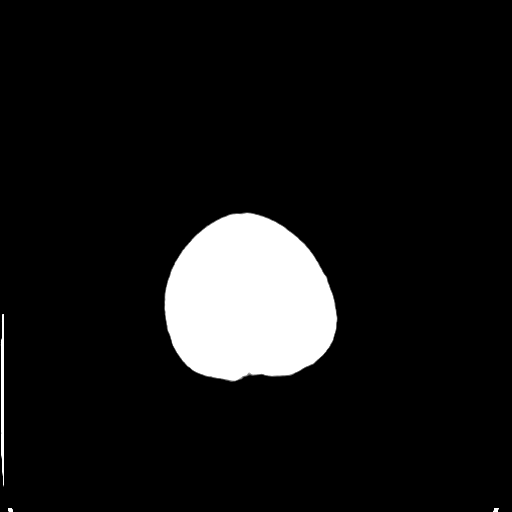
[im 29/35  bone]
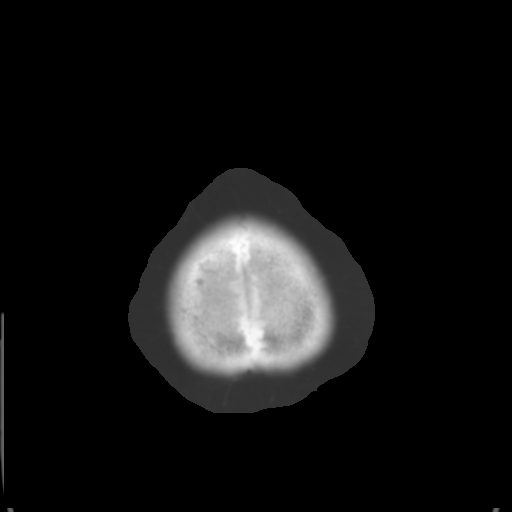
[im 32/35  brain]
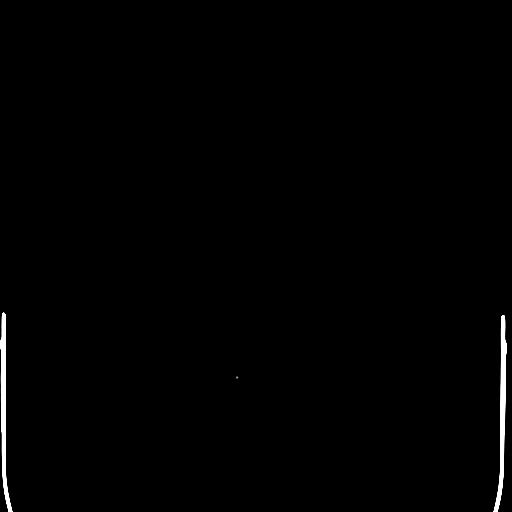

[Series 4: coronal soft tissue · coronal · 0.37mm/px · 3 of 73 slices shown]
[im 25/73  brain]
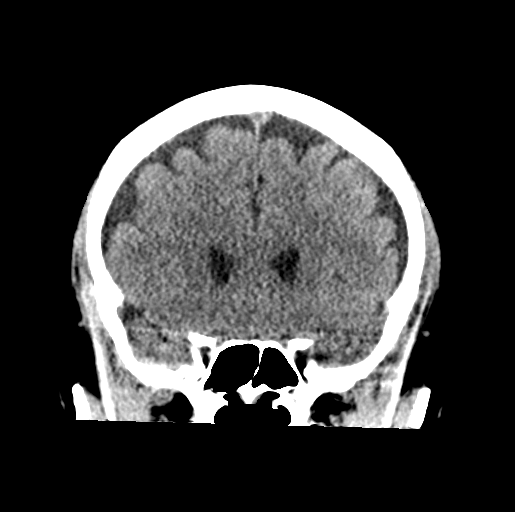
[im 33/73  brain]
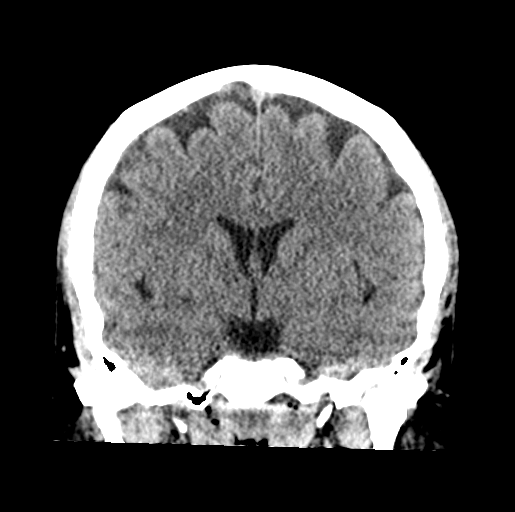
[im 41/73  brain]
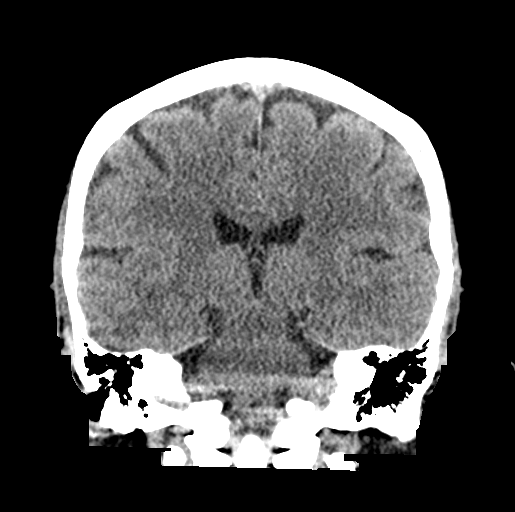

[Series 5: sagittal soft tissue · sagittal · 0.37mm/px · 3 of 60 slices shown]
[im 20/60  brain]
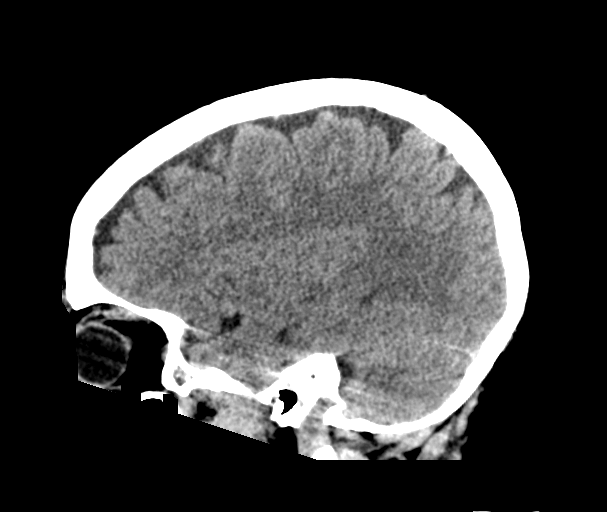
[im 30/60  brain]
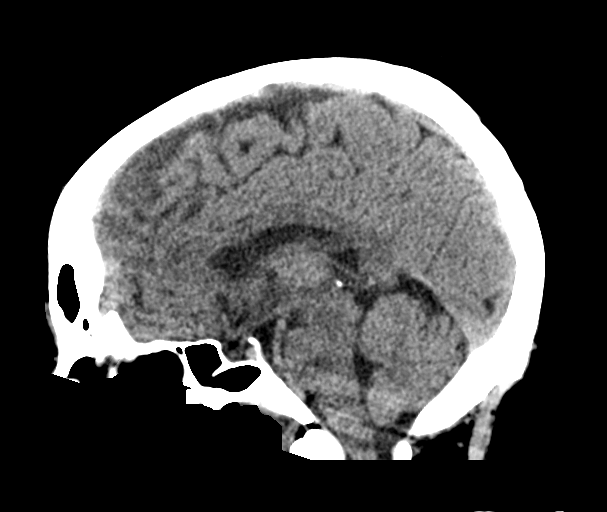
[im 40/60  brain]
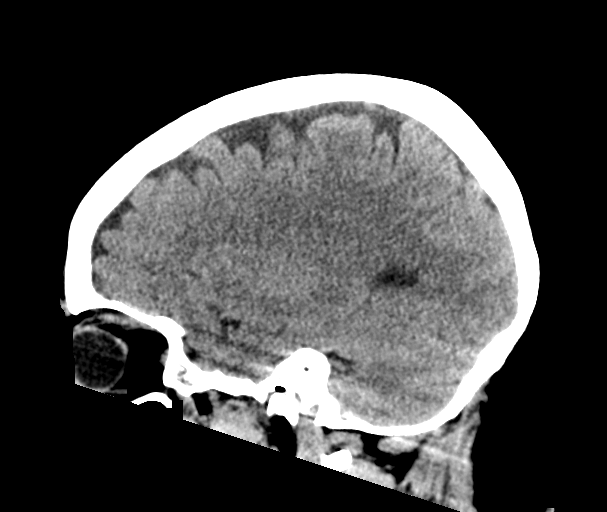

[16 of 47 positions shown; findings below may reference images not displayed]

FINDINGS: Brain:

No evidence of large-territorial acute infarction. No parenchymal
hemorrhage. No mass lesion. No extra-axial collection.

No mass effect or midline shift. No hydrocephalus. Basilar cisterns
are patent.

Vascular: No hyperdense vessel. Known tiny aneurysms not well
visualized on this study.

Skull: No acute fracture or focal lesion.

Sinuses/Orbits: Paranasal sinuses and mastoid air cells are clear.
The orbits are unremarkable.

Other: None.
IMPRESSION: No acute intracranial abnormality.

## 2020-10-05 NOTE — ED Triage Notes (Signed)
Pt states bent over and hit head on granite counter top. Pt states hit forehead and feels like she has a concussion. Pt states she feels dizzy and "a little incoherient". Pt appears in no acute distress.

## 2020-10-05 NOTE — ED Provider Notes (Signed)
[facility] Emergency Department Provider Note  ____________________________________________   Event Date/Time   First MD Initiated Contact with Patient 10/05/20 0127     (approximate)  I have reviewed the triage vital signs and the nursing notes.   HISTORY  Chief Complaint Head Injury    HPI Mary Henson is a 55 y.o. female with hypertension, bipolar who comes in with head trauma.  Patient states she bent over and hit her head on a [place] countertop.  States that she hit her forehead.  Feels that she has a concussion.  States that she had a little bit of blurry vision and feels like she needs to sleep little bit of confusion.  Since then the symptoms have gone away on their own without any medications.  Still has a little bit of pain above her right eyebrow, mild, constant, started prior to arrival nothing makes it better, nothing makes it worse.  States that she is feeling better otherwise.  No blurry vision at this time.  She is not on blood thinners.          Past Medical History:  Diagnosis Date  . Bipolar disorder ([place])   . Hypertension   . Sleep apnea     There are no problems to display for this patient.   Past Surgical History:  Procedure Laterality Date  . MYOMECTOMY    . UTERINE FIBROID EMBOLIZATION      Prior to Admission medications   Not on File    Allergies Lamotrigine, Penicillins, Clindamycin/lincomycin, Hydrochlorothiazide, Other, and Amiloride hydrochloride dihydrate [amiloride]  No family history on file.  Social History Social History   Tobacco Use  . Smoking status: Never Smoker  . Smokeless tobacco: Never Used  Vaping Use  . Vaping Use: Never used  Substance Use Topics  . Alcohol use: Never  . Drug use: Never      Review of Systems Constitutional: No fever/chills Eyes: No visual changes. ENT: No sore throat.  Positive head trauma Cardiovascular: Denies chest pain. Respiratory: Denies shortness of  breath. Gastrointestinal: No abdominal pain.  No nausea, no vomiting.  No diarrhea.  No constipation. Genitourinary: Negative for dysuria. Musculoskeletal: Negative for back pain. Skin: Negative for rash. Neurological: Negative for headaches, focal weakness or numbness. All other ROS negative ____________________________________________   PHYSICAL EXAM:  VITAL SIGNS: ED Triage Vitals  Enc Vitals Group     BP 10/05/20 0055 (!) 181/85     Pulse Rate 10/05/20 0053 (!) 104     Resp 10/05/20 0053 16     Temp 10/05/20 0053 98.4 F (36.9 C)     Temp Source 10/05/20 0053 Oral     SpO2 10/05/20 0053 100 %     Weight 10/05/20 0053 205 lb (93 kg)     Height 10/05/20 0053 5\' 4"  (1.626 m)     Head Circumference --      Peak Flow --      Pain Score 10/05/20 0053 3     Pain Loc --      Pain Edu? --      Excl. in [place]? --     Constitutional: Alert and oriented. Well appearing and in no acute distress. Eyes: Conjunctivae are normal. EOMI. mild tenderness above the right eye.  Pupils equal and reactive no obvious hematoma.  No tenderness below the Head: Mild tenderness above the right eye with very minimal swelling Nose: No congestion/rhinnorhea. Mouth/Throat: Mucous membranes are moist.   Neck: No stridor.  Trachea Midline. FROM no C-spine tenderness Cardiovascular: Normal rate, regular rhythm. Grossly normal heart sounds.  Good peripheral circulation. Respiratory: Normal respiratory effort.  No retractions. Lungs CTAB. Gastrointestinal: Soft and nontender. No distention. No abdominal bruits.  Musculoskeletal: No lower extremity tenderness nor edema.  No joint effusions. Neurologic:  Normal speech and language. No gross focal neurologic deficits are appreciated.  Skin:  Skin is warm, dry and intact. No rash noted. Psychiatric: Mood and affect are normal. Speech and behavior are normal. GU: Deferred   ____________________________________________   RADIOLOGY Robert Bellow, personally  viewed and evaluated these images (plain radiographs) as part of my medical decision making, as well as reviewing the written report by the radiologist.  ED MD interpretation: No hemorrhage  Official radiology report(s): CT Head Wo Contrast  Result Date: 10/05/2020 CLINICAL DATA:  Hit head on grand in counter top. Feels dizzy. Little incoherent. History of aneurysms. EXAM: CT HEAD WITHOUT CONTRAST TECHNIQUE: Contiguous axial images were obtained from the base of the skull through the vertex without intravenous contrast. COMPARISON:  CT head 07/13/2020, MRI head 07/09/2020 FINDINGS: Brain: No evidence of large-territorial acute infarction. No parenchymal hemorrhage. No mass lesion. No extra-axial collection. No mass effect or midline shift. No hydrocephalus. Basilar cisterns are patent. Vascular: No hyperdense vessel. Known tiny aneurysms not well visualized on this study. Skull: No acute fracture or focal lesion. Sinuses/Orbits: Paranasal sinuses and mastoid air cells are clear. The orbits are unremarkable. Other: None. IMPRESSION: No acute intracranial abnormality. Electronically Signed   By: Iven Finn M.D.   On: 10/05/2020 01:18    ____________________________________________   PROCEDURES  Procedure(s) performed (including Critical Care):  Procedures   ____________________________________________   INITIAL IMPRESSION / ASSESSMENT AND PLAN / ED COURSE  Mary Henson was evaluated in Emergency Department on 10/05/2020 for the symptoms described in the history of present illness. She was evaluated in the context of the global COVID-19 pandemic, which necessitated consideration that the patient might be at risk for infection with the SARS-CoV-2 virus that causes COVID-19. Institutional protocols and algorithms that pertain to the evaluation of patients at risk for COVID-19 are in a state of rapid change based on information released by regulatory bodies including the CDC and federal and  state organizations. These policies and algorithms were followed during the patient's care in the ED.     Patient is a 55 year old who comes in with hitting her head.  CT head ordered evaluate for intracranial hemorrhage.  She not hit her spine and no suspicion for cervical fracture.  Cleared by [facility].  Her pain is above her right eye.  No orbital pain to suggest orbital fracture.  She has no signs of trauma to the eye.  I do not think she has a retrobulbar hematoma or other acute eye pathology.  Possible mild concussion given her above symptoms but they seem to be resolving already.   CT head negative.  Will give a work note for 1 day and have patient follow-up with primary care doctor.  Discussed patient's high blood pressure and she want to have this rechecked with her primary care doctor       ____________________________________________   FINAL CLINICAL IMPRESSION(S) / ED DIAGNOSES   Final diagnoses:  Injury of head, initial encounter      MEDICATIONS GIVEN DURING THIS VISIT:  Medications - No data to display   ED Discharge Orders    None       Note:  This document was  prepared using Systems analyst and may include unintentional dictation errors.   Vanessa Verona, MD 10/05/20 [phone]

## 2020-10-05 NOTE — Discharge Instructions (Signed)
CT scan was negative.  Return to the ER if you develop worsening symptoms or any other concerns.  He can take Tylenol 1 g every 8 hours to help with pain.  Follow with your primary care doctor for recheck of your blood pressure.

## 2020-10-24 ENCOUNTER — Other Ambulatory Visit: Payer: Self-pay

## 2020-10-24 ENCOUNTER — Encounter: Payer: Self-pay | Admitting: Emergency Medicine

## 2020-10-24 DIAGNOSIS — Z046 Encounter for general psychiatric examination, requested by authority: Secondary | ICD-10-CM | POA: Diagnosis present

## 2020-10-24 DIAGNOSIS — F319 Bipolar disorder, unspecified: Secondary | ICD-10-CM | POA: Diagnosis not present

## 2020-10-24 DIAGNOSIS — F309 Manic episode, unspecified: Secondary | ICD-10-CM | POA: Diagnosis not present

## 2020-10-24 DIAGNOSIS — F29 Unspecified psychosis not due to a substance or known physiological condition: Secondary | ICD-10-CM | POA: Insufficient documentation

## 2020-10-24 DIAGNOSIS — Z20822 Contact with and (suspected) exposure to covid-19: Secondary | ICD-10-CM | POA: Diagnosis not present

## 2020-10-24 LAB — CBC
HCT: 35.8 % — ABNORMAL LOW (ref 36.0–46.0)
Hemoglobin: 11.9 g/dL — ABNORMAL LOW (ref 12.0–15.0)
MCH: 29.2 pg (ref 26.0–34.0)
MCHC: 33.2 g/dL (ref 30.0–36.0)
MCV: 88 fL (ref 80.0–100.0)
Platelets: 193 10*3/uL (ref 150–400)
RBC: 4.07 MIL/uL (ref 3.87–5.11)
RDW: 12.7 % (ref 11.5–15.5)
WBC: 5.7 10*3/uL (ref 4.0–10.5)
nRBC: 0 % (ref 0.0–0.2)

## 2020-10-24 NOTE — ED Triage Notes (Signed)
Pt reports she has Bipolar 1 disorder, reports she has been talking to God in his throne room, and is listening to voices.  Reports feeling anxious and stress. "Feels like the world is on my side like taking care of the evil"  Pt talks in complete sentences.

## 2020-10-25 ENCOUNTER — Emergency Department
Admission: EM | Admit: 2020-10-25 | Discharge: 2020-10-26 | Disposition: A | Discharge status: Other Acute Inpatient Hospital | Payer: BC Managed Care – PPO | Attending: Emergency Medicine | Admitting: Emergency Medicine

## 2020-10-25 DIAGNOSIS — F29 Unspecified psychosis not due to a substance or known physiological condition: Secondary | ICD-10-CM

## 2020-10-25 DIAGNOSIS — F319 Bipolar disorder, unspecified: Secondary | ICD-10-CM

## 2020-10-25 DIAGNOSIS — F309 Manic episode, unspecified: Secondary | ICD-10-CM | POA: Insufficient documentation

## 2020-10-25 LAB — COMPREHENSIVE METABOLIC PANEL
ALT: 21 U/L (ref 0–44)
AST: 22 U/L (ref 15–41)
Albumin: 3.9 g/dL (ref 3.5–5.0)
Alkaline Phosphatase: 74 U/L (ref 38–126)
Anion gap: 11 (ref 5–15)
BUN: 12 mg/dL (ref 6–20)
CO2: 22 mmol/L (ref 22–32)
Calcium: 8.8 mg/dL — ABNORMAL LOW (ref 8.9–10.3)
Chloride: 96 mmol/L — ABNORMAL LOW (ref 98–111)
Creatinine, Ser: 0.77 mg/dL (ref 0.44–1.00)
GFR, Estimated: >60 mL/min (ref >60)
Glucose, Bld: 151 mg/dL — ABNORMAL HIGH (ref 70–99)
Potassium: 4 mmol/L (ref 3.5–5.1)
Sodium: 129 mmol/L — ABNORMAL LOW (ref 135–145)
Total Bilirubin: 0.3 mg/dL (ref 0.3–1.2)
Total Protein: 7.6 g/dL (ref 6.5–8.1)

## 2020-10-25 LAB — RESP PANEL BY RT-PCR (FLU A&B, COVID) ARPGX2
Influenza A by PCR: NEGATIVE
Influenza B by PCR: NEGATIVE
SARS Coronavirus 2 by RT PCR: NEGATIVE

## 2020-10-25 LAB — ETHANOL: Alcohol, Ethyl (B): <10 mg/dL (ref <10)

## 2020-10-25 MED ORDER — QUETIAPINE FUMARATE 200 MG PO TABS
400.0000 mg | ORAL_TABLET | Freq: Two times a day (BID) | ORAL | Status: DC
  Administered 2020-10-25: 400 mg via ORAL
  Filled 2020-10-25: qty 2

## 2020-10-25 MED ORDER — ACETAMINOPHEN 325 MG PO TABS
650.0000 mg | ORAL_TABLET | Freq: Four times a day (QID) | ORAL | Status: DC | PRN
  Administered 2020-10-25: 650 mg via ORAL
  Filled 2020-10-25: qty 2

## 2020-10-25 MED ORDER — AMLODIPINE BESYLATE 5 MG PO TABS
5.0000 mg | ORAL_TABLET | Freq: Every day | ORAL | Status: DC
  Administered 2020-10-25: 5 mg via ORAL
  Filled 2020-10-25: qty 1

## 2020-10-25 MED ORDER — LISINOPRIL 5 MG PO TABS
30.0000 mg | ORAL_TABLET | Freq: Every day | ORAL | Status: DC
  Administered 2020-10-25: 30 mg via ORAL
  Filled 2020-10-25: qty 2

## 2020-10-25 MED ORDER — LORAZEPAM 2 MG PO TABS
2.0000 mg | ORAL_TABLET | Freq: Once | ORAL | Status: AC
  Administered 2020-10-25: 2 mg via ORAL
  Filled 2020-10-25: qty 1

## 2020-10-25 MED ORDER — OLANZAPINE 10 MG PO TBDP
10.0000 mg | ORAL_TABLET | Freq: Every day | ORAL | Status: DC
  Administered 2020-10-25: 10 mg via ORAL
  Filled 2020-10-25: qty 1

## 2020-10-25 MED ORDER — ATENOLOL 25 MG PO TABS
12.5000 mg | ORAL_TABLET | Freq: Every day | ORAL | Status: DC
  Administered 2020-10-25: 12.5 mg via ORAL
  Filled 2020-10-25: qty 1

## 2020-10-25 MED ORDER — TRAZODONE HCL 100 MG PO TABS
200.0000 mg | ORAL_TABLET | Freq: Every day | ORAL | Status: DC
Start: 2020-10-25 — End: 2020-10-26
  Administered 2020-10-25: 200 mg via ORAL
  Filled 2020-10-25: qty 2

## 2020-10-25 MED ORDER — FERROUS SULFATE 325 (65 FE) MG PO TABS
325.0000 mg | ORAL_TABLET | Freq: Every day | ORAL | Status: DC
  Filled 2020-10-25: qty 1

## 2020-10-25 NOTE — ED Notes (Signed)
Pt unable to void. Pt wearing blue hospital scrubs Pt's belongings in one bag: pair of yellow shoes, black thighs, blue underwear, black bra, brown top, gray poncho

## 2020-10-25 NOTE — ED Notes (Signed)
Patient speaking with Jasmine (TTS) and Rashaun (psych NP) at this time.

## 2020-10-25 NOTE — ED Notes (Signed)
Per report pt has been awake all night and has been visibly upset.  Pt remains anxious and is pacing in day room.  Will discuss home medications with med tech and MD and will monitor BP after pt has calmed.

## 2020-10-25 NOTE — ED Provider Notes (Signed)
[facility] Emergency Department Provider Note  ____________________________________________   Event Date/Time   First MD Initiated Contact with Patient 10/25/20 0012     (approximate)  I have reviewed the triage vital signs and the nursing notes.   HISTORY  Chief Complaint Psychiatric Evaluation  Level 5 caveat:  history/ROS limited by active psychosis / mental illness / altered mental status   HPI Mary Henson is a 55 y.o. female with history of bipolar disorder who presents voluntarily for psychiatry evaluation.  The patient says that she skipped her medicines yesterday but for several days she has been hearing the voice of God.  She talked extensively about the "throne room of God" and have got is very angry because Edmonia Lynch has entered into the throne room of God.  It is difficult to get a clear history from her as she is also tangential and started talking about the clothing that I am wearing versus the clothing the other people is wearing.  She says she knows she needs help but she says that she does not want to harm herself or anyone else.  She believes that she has a psychiatrist in the area that she has not seen recently.  She says that she has been hospitalized in a "mental institution" in the past, many years ago.  She denies any headache, neck pain, chest pain or shortness of breath, nausea, vomiting, and abdominal pain.        Past Medical History:  Diagnosis Date  . Bipolar disorder ([place])   . Hypertension   . Sleep apnea     There are no problems to display for this patient.   Past Surgical History:  Procedure Laterality Date  . MYOMECTOMY    . UTERINE FIBROID EMBOLIZATION      Prior to Admission medications   Not on File    Allergies Lamotrigine, Penicillins, Clindamycin/lincomycin, Hydrochlorothiazide, Other, and Amiloride hydrochloride dihydrate [amiloride]  No family history on file.  Social History Social History    Tobacco Use  . Smoking status: Never Smoker  . Smokeless tobacco: Never Used  Vaping Use  . Vaping Use: Never used  Substance Use Topics  . Alcohol use: Never  . Drug use: Never    Review of Systems Level 5 caveat:  history/ROS limited by active psychosis / mental illness / altered mental status   PHYSICAL EXAM:  VITAL SIGNS: ED Triage Vitals  Enc Vitals Group     BP 10/24/20 2336 (!) 168/83     Pulse Rate 10/24/20 2336 (!) 115     Resp 10/24/20 2336 20     Temp 10/24/20 2336 98.3 F (36.8 C)     Temp Source 10/24/20 2336 Oral     SpO2 10/24/20 2336 99 %     Weight 10/24/20 2339 90.7 kg (200 lb)     Height 10/24/20 2339 1.626 m (5\' 4" )     Head Circumference --      Peak Flow --      Pain Score 10/24/20 2339 0     Pain Loc --      Pain Edu? --      Excl. in [place]? --    Level 5 caveat:  history/ROS limited by active psychosis / mental illness / altered mental status  Constitutional: Awake and alert. Eyes: Conjunctivae are normal.  Head: Atraumatic. Nose: No congestion/rhinnorhea. Mouth/Throat: Patient is wearing a mask. Neck: No stridor.  No meningeal signs.   Cardiovascular: Normal rate, regular  rhythm. Good peripheral circulation. Respiratory: Normal respiratory effort.  No retractions. Gastrointestinal: Soft and nontender. No distention.  Musculoskeletal: No lower extremity tenderness nor edema. No gross deformities of extremities. Neurologic:  Normal speech and language. No gross focal neurologic deficits are appreciated.  Skin:  Skin is warm, dry and intact. Psychiatric: Patient appears manic, tangential speech, flight of ideas, hyper religious, endorsing auditory hallucinations (God is talking to her).  Lacks capacity to make her own decisions even her acute psychosis.  ____________________________________________   LABS (all labs ordered are listed, but only abnormal results are displayed)  Labs Reviewed  COMPREHENSIVE METABOLIC PANEL - Abnormal;  Notable for the following components:      Result Value   Sodium 129 (*)    Chloride 96 (*)    Glucose, Bld 151 (*)    Calcium 8.8 (*)    All other components within normal limits  CBC - Abnormal; Notable for the following components:   Hemoglobin 11.9 (*)    HCT 35.8 (*)    All other components within normal limits  RESP PANEL BY RT-PCR (FLU A&B, COVID) ARPGX2  ETHANOL  URINE DRUG SCREEN, QUALITATIVE ([facility] ONLY)   ____________________________________________  EKG  No indication for emergent EKG ____________________________________________  RADIOLOGY Ursula Alert, personally viewed and evaluated these images (plain radiographs) as part of my medical decision making, as well as reviewing the written report by the radiologist.  ED MD interpretation: No indication for emergent imaging  Official radiology report(s): No results found.  ____________________________________________   PROCEDURES   Procedure(s) performed (including Critical Care):  Procedures   ____________________________________________   INITIAL IMPRESSION / MDM / ASSESSMENT AND PLAN / ED COURSE  As part of my medical decision making, I reviewed the following data within the [place] notes reviewed and incorporated, Labs reviewed , Old chart reviewed, A consult was requested and obtained from this/these consultant(s) Psychiatry and Notes from prior ED visits   Differential diagnosis includes, but is not limited to, bipolar disorder, schizophrenia or schizoaffective disorder, acute psychosis NOS, mood disorder, medication or drug side effect, less likely metabolic or infectious abnormality.  Patient is not reporting any acute medical issues.  Her presentation is consistent with a psychiatric disorder.  I am concerned that she may change her mind about getting help and try to leave and at this point I do not believe she has decision-making capacity given her hallucinations and  psychosis.  I am placing her under involuntary commitment.  The patient has been placed in psychiatric observation due to the need to provide a safe environment for the patient while obtaining psychiatric consultation and evaluation, as well as ongoing medical and medication management to treat the patient's condition.  The patient has been placed under full IVC at this time.      Clinical Course as of 10/25/20 0540  Nancy Fetter Oct 25, 2020  0149 Discussed with Rashaun, she will be admitted [CF]  0310 Patient's lab work is generally reassuring.  She has some mild hyponatremia but I will encourage p.o. fluids.  Her kidney function is normal and other electrolytes are reassuring.  CBC is normal.  COVID-19 test is negative.  Ethanol level is negative.  She was evaluated by psychiatry who agrees she needs inpatient treatment.  The patient continued to be agitated with psychotic symptoms but was willing to take Zyprexa 10 mg ODT which has helped calm her down. [CF]    Clinical Course User Index [CF] Hinda Kehr, MD  ____________________________________________  FINAL CLINICAL IMPRESSION(S) / ED DIAGNOSES  Final diagnoses:  Psychosis, unspecified psychosis type ([place])  Mania ([place])     MEDICATIONS GIVEN DURING THIS VISIT:  Medications  OLANZapine zydis (ZYPREXA) disintegrating tablet 10 mg (has no administration in time range)     ED Discharge Orders    None      *Please note:  Mary Henson was evaluated in Emergency Department on 10/25/2020 for the symptoms described in the history of present illness. She was evaluated in the context of the global COVID-19 pandemic, which necessitated consideration that the patient might be at risk for infection with the SARS-CoV-2 virus that causes COVID-19. Institutional protocols and algorithms that pertain to the evaluation of patients at risk for COVID-19 are in a state of rapid change based on information released by regulatory bodies including the  CDC and federal and state organizations. These policies and algorithms were followed during the patient's care in the ED.  Some ED evaluations and interventions may be delayed as a result of limited staffing during and after the pandemic.*  Note:  This document was prepared using Dragon voice recognition software and may include unintentional dictation errors.   Hinda Kehr, MD 10/25/20 [phone]

## 2020-10-25 NOTE — ED Notes (Signed)
Pt to room at this time.  Is lying on bed, eyes closed resp even and non labored. Will monitor.

## 2020-10-25 NOTE — ED Notes (Signed)
Hourly rounding reveals patient in room. No complaints, stable, in no acute distress. Q15 minute rounds and monitoring via Security Cameras to continue. 

## 2020-10-25 NOTE — ED Notes (Signed)
Pt. Transferred to [place] from ED to room 2 after screening for contraband. Report to include Situation, Background, Assessment and Recommendations from [facility]. Pt. Oriented to unit including Q15 minute rounds as well as the security cameras for their protection. Patient is alert and oriented, warm and dry in no acute distress. Patient denies SI, HI, and VH She reported VH. Pt. Encouraged to let me know if needs arise.

## 2020-10-25 NOTE — ED Notes (Signed)
Report to include Situation, Background, Assessment, and Recommendations received from [facility]. Patient alert and oriented, warm and dry, in no acute distress. Patient denies SI, HI, VH and pain.She reports AH. Patient made aware of Q15 minute rounds and security cameras for their safety. Patient instructed to come to me with needs or concerns.

## 2020-10-25 NOTE — ED Notes (Signed)
Pt given lunch and juice at this time.

## 2020-10-25 NOTE — ED Notes (Signed)
TTS to room to discuss POC with pt and obtain permission to call family.  Upon TTS leaving the room pt can be heard from nurses station yelling loudly.  TTS states pt is asking for something for her anxiety.  Pt continues to yell loudly, this RN to room to speak with pt.  When asked why she is yelling, pt responds in a calm voice "I am not yelling I am wailing. Something has made me sad and I am letting it out."  Pt again requests something help her calm down, "like they gave me last night",  Pt encouraged to be considerate of others and decrease the noise level, pt agrees.

## 2020-10-25 NOTE — ED Notes (Signed)
Took pt cup of water

## 2020-10-25 NOTE — Consult Note (Signed)
[place] Psychiatry Consult   Reason for Consult:  Psych Consult Referring Physician:  Dr. Karma Greaser Patient Identification: Mary Henson MRN:  [id number] Principal Diagnosis: Bipolar 1 disorder ([place]) Diagnosis:  Principal Problem:   Bipolar 1 disorder ([place])   Total Time spent with patient: 1 hour  Subjective:   Mary Henson is a 55 y.o. female states " Im really anxious, I not crazy".  HPI:  Tele Assessment  Mary Henson, 55 y.o., female patient presented to [facility].  Patient seen by TTS and this provider; chart reviewed and consulted with Dr. Dwyane Dee on 10/25/20.  On evaluation Mary Henson reports that she's here because she has been feeling really anxious. During the assessment she states that she is having a battle with the devil and god and states that she is in the "throne room".  Per TTS, pt reports she has Bipolar 1 disorder, reports she has been talking to God in his throne room, and is listening to voices. Reports feeling anxious and stress. "Feels like the world is on my side like taking care of the evil" Pt talks in complete sentences.   Mary Henson is a 55 year old patient who presented to [facility] ED due to sx of stress intolerance and anxiety. Pt was noted to be neat and well-groomed. When asked what brought her to the hospital the pt. stated, "I'm so anxious. I'm not crazy" Pt presented with a labile mood and affect. The patient was religiously preoccupied and began crying about the holy spirit being angry. Pt's speech is circumstantial and largely irrelevant. Pt presented with good eye contact. Pt verbalized that she feels as if she's going to have a nervous breakdown. Pt presents with poor insight. Pt has impaired judgment and impulse control. PT denies any substance use. Pt reports increased sx of anxiety and low stress tolerance. Pt reported that her sleep is poor, evidenced by her being up for approximately 1 week. Pt also reported having a poor appetite. Pt currently denied  suicidal and homicidal ideation. Pt endorsed auditory hallucinations and delusions about the devil talking to her in a throne room that is sacred.    Recommendations:  Psychiatric inpatient hospitalization  Past Psychiatric History: Bipolar 1 disorder  Risk to Self:  no Risk to Others:  no Prior Inpatient Therapy:  unknown Prior Outpatient Therapy:  unknown  Past Medical History:  Past Medical History:  Diagnosis Date  . Bipolar disorder ([place])   . Hypertension   . Sleep apnea     Past Surgical History:  Procedure Laterality Date  . MYOMECTOMY    . UTERINE FIBROID EMBOLIZATION     Family History: No family history on file. Family Psychiatric  History: unknown Social History:  Social History   Substance and Sexual Activity  Alcohol Use Never     Social History   Substance and Sexual Activity  Drug Use Never    Social History   Socioeconomic History  . Marital status: Single    Spouse name: Not on file  . Number of children: Not on file  . Years of education: Not on file  . Highest education level: Not on file  Occupational History  . Not on file  Tobacco Use  . Smoking status: Never Smoker  . Smokeless tobacco: Never Used  Vaping Use  . Vaping Use: Never used  Substance and Sexual Activity  . Alcohol use: Never  . Drug use: Never  . Sexual activity: Never  Other Topics Concern  .  Not on file  Social History Narrative  . Not on file   Social Determinants of Health   Financial Resource Strain: Not on file  Food Insecurity: Not on file  Transportation Needs: Not on file  Physical Activity: Not on file  Stress: Not on file  Social Connections: Not on file   Additional Social History:    Allergies:   Allergies  Allergen Reactions  . Lamotrigine Rash and Swelling    Swollen tongue Swollen tongue   . Penicillins Swelling    Other reaction(s): Angioedema  . Clindamycin/Lincomycin Rash  . Hydrochlorothiazide Other (See Comments) and Rash     Other reaction(s): Other (See Comments) Hyponatremia Hyponatremia Hyponatremia Hyponatremia   . Other Rash  . Amiloride Hydrochloride Dihydrate [Amiloride]     Labs:  Results for orders placed or performed during the hospital encounter of 10/25/20 (from the past 48 hour(s))  Comprehensive metabolic panel     Status: Abnormal   Collection Time: 10/24/20 11:45 PM  Result Value Ref Range   Sodium 129 (L) 135 - 145 mmol/L   Potassium 4.0 3.5 - 5.1 mmol/L   Chloride 96 (L) 98 - 111 mmol/L   CO2 22 22 - 32 mmol/L   Glucose, Bld 151 (H) 70 - 99 mg/dL    Comment: Glucose reference range applies only to samples taken after fasting for at least 8 hours.   BUN 12 6 - 20 mg/dL   Creatinine, Ser 0.77 0.44 - 1.00 mg/dL   Calcium 8.8 (L) 8.9 - 10.3 mg/dL   Total Protein 7.6 6.5 - 8.1 g/dL   Albumin 3.9 3.5 - 5.0 g/dL   AST 22 15 - 41 U/L   ALT 21 0 - 44 U/L   Alkaline Phosphatase 74 38 - 126 U/L   Total Bilirubin 0.3 0.3 - 1.2 mg/dL   GFR, Estimated >60 >60 mL/min    Comment: (NOTE) Calculated using the CKD-EPI Creatinine Equation (2021)    Anion gap 11 5 - 15    Comment: Performed at [facility], [address] [place], [place] [address]  Ethanol     Status: None   Collection Time: 10/24/20 11:45 PM  Result Value Ref Range   Alcohol, Ethyl (B) <10 <10 mg/dL    Comment: (NOTE) Lowest detectable limit for serum alcohol is 10 mg/dL.  For medical purposes only. Performed at [facility], [address] [place], [place] [address]   cbc     Status: Abnormal   Collection Time: 10/24/20 11:45 PM  Result Value Ref Range   WBC 5.7 4.0 - 10.5 K/uL   RBC 4.07 3.87 - 5.11 MIL/uL   Hemoglobin 11.9 (L) 12.0 - 15.0 g/dL   HCT 35.8 (L) 36.0 - 46.0 %   MCV 88.0 80.0 - 100.0 fL   MCH 29.2 26.0 - 34.0 pg   MCHC 33.2 30.0 - 36.0 g/dL   RDW 12.7 11.5 - 15.5 %   Platelets 193 150 - 400 K/uL   nRBC 0.0 0.0 - 0.2 %    Comment: Performed at [facility], [address], [place], [place] [address]  Resp Panel by RT-PCR (Flu A&B, Covid) Nasopharyngeal Swab     Status: None   Collection Time: 10/25/20  1:30 AM   Specimen: Nasopharyngeal Swab; Nasopharyngeal(NP) swabs in vial transport medium  Result Value Ref Range   SARS Coronavirus 2 by RT PCR NEGATIVE NEGATIVE    Comment: (NOTE) SARS-CoV-2 target nucleic acids are NOT DETECTED.  The SARS-CoV-2 RNA is generally detectable in upper respiratory specimens during the acute phase of infection. The lowest concentration of SARS-CoV-2 viral copies this assay can detect is 138 copies/mL. A negative result does not preclude SARS-Cov-2 infection and should not be used as the sole basis for treatment or other patient management decisions. A negative result may occur with  improper specimen collection/handling, submission of specimen other than nasopharyngeal swab, presence of viral mutation(s) within the areas targeted by this assay, and inadequate number of viral copies(<138 copies/mL). A negative result must be combined with clinical observations, patient history, and epidemiological information. The expected result is Negative.  Fact Sheet for Patients:  EntrepreneurPulse.com.au  Fact Sheet for Healthcare Providers:  IncredibleEmployment.be  This test is no t yet approved or cleared by the [place] FDA and  has been authorized for detection and/or diagnosis of SARS-CoV-2 by FDA under an Emergency Use Authorization (EUA). This EUA will remain  in effect (meaning this test can be used) for the duration of the COVID-19 declaration under Section 564(b)(1) of the Act, 21 U.S.C.section 360bbb-3(b)(1), unless the authorization is terminated  or revoked sooner.       Influenza A by PCR NEGATIVE NEGATIVE   Influenza B by PCR NEGATIVE NEGATIVE    Comment: (NOTE) The Xpert Xpress SARS-CoV-2/FLU/RSV plus assay is intended as an aid in the diagnosis of influenza  from Nasopharyngeal swab specimens and should not be used as a sole basis for treatment. Nasal washings and aspirates are unacceptable for Xpert Xpress SARS-CoV-2/FLU/RSV testing.  Fact Sheet for Patients: EntrepreneurPulse.com.au  Fact Sheet for Healthcare Providers: IncredibleEmployment.be  This test is not yet approved or cleared by the [place] FDA and has been authorized for detection and/or diagnosis of SARS-CoV-2 by FDA under an Emergency Use Authorization (EUA). This EUA will remain in effect (meaning this test can be used) for the duration of the COVID-19 declaration under Section 564(b)(1) of the Act, 21 U.S.C. section 360bbb-3(b)(1), unless the authorization is terminated or revoked.  Performed at [facility], [address] [place], [place] [address]     Current Facility-Administered Medications  Medication Dose Route Frequency Provider Last Rate Last Admin  . OLANZapine zydis (ZYPREXA) disintegrating tablet 10 mg  10 mg Oral QHS Hinda Kehr, MD       No current outpatient medications on file.    Musculoskeletal: Strength & Muscle Tone: within normal limits Gait & Station: normal Patient leans: N/A  Psychiatric Specialty Exam: Physical Exam Vitals and nursing note reviewed.  HENT:     Head: Normocephalic and atraumatic.     Nose: Nose normal.     Mouth/Throat:     Mouth: Mucous membranes are dry.  Eyes:     Pupils: Pupils are equal, round, and reactive to light.  Pulmonary:     Effort: Pulmonary effort is normal.  Musculoskeletal:        General: Normal range of motion.     Cervical back: Normal range of motion.  Skin:    General: Skin is warm and dry.  Neurological:     General: No focal deficit present.     Mental Status: She is alert and oriented to person, place, and time. Mental status is at baseline.  Psychiatric:        Attention and Perception: Attention and perception normal.         Mood and Affect: Mood is anxious. Affect is labile and inappropriate.        Speech: Speech  is delayed and tangential.        Behavior: Behavior is cooperative.        Thought Content: Thought content is paranoid and delusional.        Cognition and Memory: Cognition is impaired.        Judgment: Judgment is impulsive and inappropriate.     Review of Systems  Psychiatric/Behavioral: Positive for behavioral problems, decreased concentration and sleep disturbance. The patient is nervous/anxious and is hyperactive.   All other systems reviewed and are negative.   Blood pressure (!) 158/80, pulse 98, temperature 98.2 F (36.8 C), temperature source Oral, resp. rate 18, height 5\' 4"  (1.626 m), weight 90.7 kg, last menstrual period 11/02/2019, SpO2 99 %.Body mass index is 34.33 kg/m.  General Appearance: Casual  Eye Contact:  Fair  Speech:  Pressured and Slow  Volume:  Normal  Mood:  Anxious and Dysphoric  Affect:  Inappropriate  Thought Process:  Disorganized, Irrelevant and Descriptions of Associations: Tangential  Orientation:  Full (Time, Place, and Person)  Thought Content:  Illogical  Suicidal Thoughts:  No  Homicidal Thoughts:  No  Memory:  Recent;   Fair  Judgement:  Impaired  Insight:  Lacking  Psychomotor Activity:  Normal  Concentration:  Attention Span: Fair  Recall:  [employer] of Knowledge:  Fair  Language:  Fair  Akathisia:  NA  Handed:  Right  AIMS (if indicated):     Assets:  Desire for Improvement Housing Physical Health Social Support Transportation  ADL's:  Intact  Cognition:  Impaired,  Mild  Sleep:        Treatment Plan Summary: Daily contact with patient to assess and evaluate symptoms and progress in treatment and Medication management  -Mary Henson was [facility] to [facility]  for Bipolar 1 disorder [facility]) crisis management, and stabilization. -Routine labs; which include CBC, CMP, UA, ETOH, Urine pregnancy, HCG, and UDS were reviewed   -medication management:  -Will maintain observation checks every 15 minutes for safety. -Psychosocial education regarding relapse prevention and self-care; Social and communication  -Social work will consult with family for collateral information and discuss discharge and follow up plan.  Disposition: Recommend psychiatric Inpatient admission when medically cleared. Supportive therapy provided about ongoing stressors. Discussed crisis plan, support from social network, calling 911, coming to the Emergency Department, and calling Suicide Hotline.  Deloria Lair, NP 10/25/2020 5:47 AM

## 2020-10-25 NOTE — BH Assessment (Addendum)
Referral information for Psychiatric Hospitalization faxed to:  [name] [place] [facility]- Per Marcie Bal, Charge RN no beds available at this time   . [facility] [phone]) Per Jerene Pitch, [facility] pt under review   [facility] ([phone]- [phone]) Evelena Peat contacted TTS reporting pt to be under review and requesting UDS  [facility] [phone]),   [facility] [phone] -or- [phone]),   [place] [phone] or [phone])  [facility] [phone] or [phone])  . [place] ([phone]),   [facility] [phone]).  [place] [phone] or [phone]),

## 2020-10-25 NOTE — BH Assessment (Addendum)
PATIENT IS SCHEDULED FOR ADMISSION TODAY OR TOMORROW AFTER 8AM    PT NEEDS TO ARRIVE WITH CPAP IF REQUIRED   Patient has been accepted to [facility] Patient assigned to: [address] [place] Accepting physician is Dr. Ree Kida Call report to [phone] Representative was [facility]   ER Staff is aware of it:  Edd [place], ER Aundria Mems, ER MD  Anderson Malta, Patient's Nurse  TTS awaiting consent from pt tocontact Family/Support System at this time.

## 2020-10-25 NOTE — ED Notes (Signed)
VS obtained; Anderson Malta, RN notified. Will obtain VS again at a later time when pt is calmer.

## 2020-10-25 NOTE — ED Notes (Signed)
Patient reports being Bipolar I, states she is missed her medicine on Saturday but usually she takes takes it.  Reports having religious thoughts about God, Edmonia Lynch and the AntiChrist.  Patient denies SI and HI.

## 2020-10-25 NOTE — ED Notes (Signed)
IVC/pending inpatient admit when medically cleared.

## 2020-10-25 NOTE — ED Notes (Signed)
Med Rec tech to room to reconcile medications.  Pt sleeping soundly, eyes closed, resp even and non labored.  Pt was unable to be aroused at this time.  Medications obtained from an old pharmacy record, will verify when pt wakes.

## 2020-10-25 NOTE — BH Assessment (Signed)
Comprehensive Clinical Assessment (CCA) Note  10/25/2020 Mary Henson [id number] Recommendations for Services/Supports/Treatments: Per Mary D., pt. is recommended for inpatient treatment.  Per triage note: Pt reports she has Bipolar 1 disorder, reports she has been talking to God in his throne room, and is listening to voices.  Reports feeling anxious and stress. "Feels like the world is on my side like taking care of the evil" Pt talks in complete sentences.   Mary Henson. [place] is a 55 year old patient who presented to [facility] ED due to sx of stress intolerance and anxiety. Pt was noted to be neat and well-groomed. When asked what brought her to the hospital the pt. stated, "I'm so anxious. I'm not crazy" Pt presented with a labile mood and affect. The patient was religiously preoccupied and began crying about the holy spirit being angry. Pt's speech is circumstantial and largely irrelevant. Pt presented with good eye contact. Pt verbalized that she feels as if she's going to have a nervous breakdown. Pt presents with poor insight. Pt has impaired judgment and impulse control. PT denies any substance use. Pt reports increased sx of anxiety and low stress tolerance. Pt reported that her sleep is poor, evidenced by her being up for approximately 1 week. Pt also reported having a poor appetite. Pt currently denied suicidal and homicidal ideation. Pt endorsed auditory hallucinations and delusions about the devil talking to her in a throne room that is sacred.  [place] ED from 10/25/2020 in [place] [facility] from 10/05/2020 in [place] [facility] from 09/13/2020 in [place] No Risk No Risk No Risk    The patient demonstrates the following risk factors for suicide: Chronic risk factors for suicide include: psychiatric disorder of Bipolar I disorder. Acute risk factors for  suicide include: N/A. Protective factors for this patient include: hope for the future. Considering these factors, the overall suicide risk at this point appears to be low. Patient is not appropriate for outpatient follow up.  Chief Complaint:  Chief Complaint  Patient presents with  . Psychiatric Evaluation   Visit Diagnosis: Bipolar I disorder Mania    CCA Screening, Triage and Referral (STR)  Patient Reported Information How did you hear about [place]? Self  Referral name: Self  Referral phone number: No data recorded  Whom do you see for routine medical problems? Primary Care  Practice/Facility Name: No data recorded Practice/Facility Phone Number: No data recorded Name of Contact: No data recorded Contact Number: [phone]  Contact Fax Number: No data recorded Prescriber Name: Orion Modest, MD  Prescriber Address (if known): No data recorded  What Is the Reason for Your Visit/Call Today? Mania  How Long Has This Been Causing You Problems? 1 wk - 1 month  What Do You Feel Would Help You the Most Today? Other (Comment) (Pt unable to identify needs)   Have You Recently Been in Any Inpatient Treatment (Hospital/Detox/Crisis Center/28-Day Program)? No  Name/Location of Program/Hospital:No data recorded How Long Were You There? No data recorded When Were You Discharged? No data recorded  Have You Ever Received Services From [facility] Before? No  Who Do You See at [facility]? No data recorded  Have You Recently Had Any Thoughts About Hurting Yourself? No  Are You Planning to Commit Suicide/Harm Yourself At This time? No   Have you Recently Had Thoughts About [place]? No  Explanation: No data recorded  Have You Used Any Alcohol or Drugs in the Past 24 Hours? No  How Long Ago Did You Use Drugs or Alcohol? No data recorded What Did You Use and How Much? No data recorded  Do You Currently Have a Therapist/Psychiatrist? Yes  Name of  Therapist/Psychiatrist: Rozanna Henson, PMHNP   Have You Been Recently Discharged From Any Office Practice or Programs? No  Explanation of Discharge From Practice/Program: No data recorded    CCA Screening Triage Referral Assessment Type of Contact: Face-to-Face  Is this Initial or Reassessment? No data recorded Date Telepsych consult ordered in [facility]:  No data recorded Time Telepsych consult ordered in [facility]:  No data recorded  Patient Reported Information Reviewed? Yes  Patient Left Without Being Seen? No data recorded Reason for Not Completing Assessment: No data recorded  Collateral Involvement: No data recorded  Does Patient Have a [place]? No data recorded Name and Contact of Legal Guardian: No data recorded If Minor and Not Living with Parent(s), Who has Custody? No data recorded Is CPS involved or ever been involved? Never  Is APS involved or ever been involved? Never   Patient Determined To Be At Risk for Harm To Self or Others Based on Review of Patient Reported Information or Presenting Complaint? No  Method: No data recorded Availability of Means: No data recorded Intent: No data recorded Notification Required: No data recorded Additional Information for Danger to Others Potential: No data recorded Additional Comments for Danger to Others Potential: No data recorded Are There Guns or Other Weapons in Your Home? No data recorded Types of Guns/Weapons: No data recorded Are These Weapons Safely Secured?                            No data recorded Who Could Verify You Are Able To Have These Secured: No data recorded Do You Have any Outstanding Charges, Pending Court Dates, Parole/Probation? No data recorded Contacted To Inform of Risk of Harm To Self or Others: No data recorded  [facility] of Assessment: [facility] ED   Does Patient Present under Involuntary Commitment? Yes  IVC Papers Initial File Date: 10/25/2020   [place] of Residence:  [place]   Patient Currently Receiving the Following Services: Medication Management   Determination of Need: No data recorded  Options For Referral: Inpatient Hospitalization     CCA Biopsychosocial Intake/Chief Complaint:  Mania  Current Symptoms/Problems: Anxiety   Patient Reported Schizophrenia/Schizoaffective Diagnosis in Past: No   Strengths: Good fund of knowledge  Preferences: None reported  Abilities: feelings identification   Type of Services Patient Feels are Needed: Pt unable to identify services needed   Initial Clinical Notes/Concerns: Pt hyperreligious; nonsensical speech   Mental Health Symptoms Depression:  None   Duration of Depressive symptoms: No data recorded  Mania:  Racing thoughts; Increased Energy   Anxiety:   Restlessness; Tension   Psychosis:  Hallucinations   Duration of Psychotic symptoms: Greater than six months   Trauma:  None   Obsessions:  Poor insight   Compulsions:  None   Inattention:  None   Hyperactivity/Impulsivity:  N/A   Oppositional/Defiant Behaviors:  None   Emotional Irregularity:  Mood lability   Other Mood/Personality Symptoms:  No data recorded   Mental Status Exam Appearance and self-care  Stature:  Average   Weight:  Overweight   Clothing:  Neat/clean   Grooming:  Well-groomed   Cosmetic use:  None   Posture/gait:  Normal   Motor activity:  Not Remarkable   Sensorium  Attention:  Distractible   Concentration:  Focuses on irrelevancies; Scattered   Orientation:  Time; Situation; Person; Object; Place   Recall/memory:  Normal   Affect and Mood  Affect:  Labile   Mood:  Hypomania   Relating  Eye contact:  Normal   Facial expression:  Responsive   Attitude toward examiner:  Cooperative   Thought and Language  Speech flow: Slurred   Thought content:  Delusions   Preoccupation:  Religion   Hallucinations:  Auditory   Organization:  No data recorded  [employer] of Knowledge:  Good   Intelligence:  Average   Abstraction:  Normal   Judgement:  Poor   Reality Testing:  Distorted   Insight:  Lacking; Poor   Decision Making:  Confused   Social Functioning  Social Maturity:  Impulsive   Social Judgement:  Normal   Stress  Stressors:  Illness   Coping Ability:  Programme researcher, broadcasting/film/video Deficits:  None   Supports:  Family     Religion: Religion/Spirituality Are You A Religious Person?: Yes What is Your Religious Affiliation?: International aid/development worker: Leisure / Recreation Do You Have Hobbies?: No  Exercise/Diet: Exercise/Diet Do You Exercise?: No Have You Gained or Lost A Significant Amount of Weight in the Past Six Months?: No Do You Follow a Special Diet?: No Do You Have Any Trouble Sleeping?: Yes Explanation of Sleeping Difficulties: Pt reported not sleeping for a week.   CCA Employment/Education Employment/Work Situation: Employment / Work Situation Employment situation: Employed Where is patient currently employed?: [employer] Has patient ever been in the [employer]?: No  Education: Education Is Patient Currently Attending School?: No   CCA Family/Childhood History Family and Relationship History: Family history Marital status: Single  Childhood History:     Child/Adolescent Assessment:     CCA Substance Use Alcohol/Drug Use: Alcohol / Drug Use History of alcohol / drug use?: No history of alcohol / drug abuse                         ASAM's:  Six Dimensions of Multidimensional Assessment  Dimension 1:  Acute Intoxication and/or Withdrawal Potential:      Dimension 2:  Biomedical Conditions and Complications:      Dimension 3:  Emotional, Behavioral, or Cognitive Conditions and Complications:     Dimension 4:  Readiness to Change:     Dimension 5:  Relapse, Continued use, or Continued Problem Potential:     Dimension 6:  Recovery/Living Environment:      ASAM Severity Score:    ASAM Recommended Level of Treatment:    Mary Henson, LCAS

## 2020-10-25 NOTE — ED Notes (Signed)
Pt gave verbal permission to give updates to cousin Katharine Look

## 2020-10-25 NOTE — ED Notes (Signed)
Snack and beverage given. 

## 2020-10-26 NOTE — ED Notes (Signed)
Hourly rounding reveals patient in room. No complaints, stable, in no acute distress. Q15 minute rounds and monitoring via Security Cameras to continue. 

## 2020-10-26 NOTE — ED Notes (Signed)
VS not taken, Patient asleep.

## 2020-10-26 NOTE — ED Notes (Signed)
Talked to nurse at [facility]. She asked that I call back when transport was here. I informed her that transport was on the way and I asked if I could give report and she said no, that they would wait until transport came

## 2020-10-26 NOTE — ED Notes (Signed)
pts belongings, 1 bag, given to [employer].

## 2020-10-26 NOTE — ED Notes (Signed)
Report given to brynn marr

## 2020-10-26 NOTE — ED Provider Notes (Signed)
Emergency Medicine Observation Re-evaluation Note  Mary Henson is a 55 y.o. female, seen on rounds today.  Pt initially presented to the ED for complaints of Psychiatric Evaluation Currently, the patient is alert, pleasant in no distress ambulatory.  Physical Exam  BP (!) 151/83 (BP Location: Left Arm)   Pulse 89   Temp 98.3 F (36.8 C) (Oral)   Resp 18   Ht 5\' 4"  (1.626 m)   Wt 90.7 kg   LMP 11/02/2019   SpO2 99%   BMI 34.33 kg/m  Physical Exam General: Alert and oriented, pleasant Cardiac: Normal perfusion normal capillary refill Lungs: Clear speech without distress Psych: Pleasant, well oriented at this time.  Patient understanding and comfortable with plan to transfer to [facility]  ED Course / MDM  EKG:   I have reviewed the labs performed to date as well as medications administered while in observation.  Recent changes in the last 24 hours include negative Covid test has returned.  Plan  Current plan is for inpatient psychiatric care. Patient is under full IVC at this time.  Vitals:   10/26/20 0832 10/26/20 0849  BP: 131/80 131/80  Pulse: (!) 101 (!) 101  Resp: 17 17  Temp: 98.2 F (36.8 C) 98.2 F (36.8 C)  SpO2: 98% 98%    Patient understanding of plan to transfer.  Alert conversant stable for transfer   Delman Kitten, MD 10/26/20 [phone]

## 2020-10-26 NOTE — ED Notes (Signed)
Called [facility] for [facility] transport to [facility] [phone]

## 2021-01-17 ENCOUNTER — Emergency Department
Admission: EM | Admit: 2021-01-17 | Discharge: 2021-01-17 | Disposition: A | Discharge status: Home / Self Care | Payer: BC Managed Care – PPO | Attending: Emergency Medicine | Admitting: Emergency Medicine

## 2021-01-17 ENCOUNTER — Emergency Department: Payer: BC Managed Care – PPO

## 2021-01-17 ENCOUNTER — Encounter: Payer: Self-pay | Admitting: Emergency Medicine

## 2021-01-17 ENCOUNTER — Other Ambulatory Visit: Payer: Self-pay

## 2021-01-17 DIAGNOSIS — R079 Chest pain, unspecified: Secondary | ICD-10-CM

## 2021-01-17 DIAGNOSIS — R0789 Other chest pain: Secondary | ICD-10-CM | POA: Insufficient documentation

## 2021-01-17 DIAGNOSIS — I10 Essential (primary) hypertension: Secondary | ICD-10-CM | POA: Insufficient documentation

## 2021-01-17 DIAGNOSIS — Z79899 Other long term (current) drug therapy: Secondary | ICD-10-CM | POA: Diagnosis not present

## 2021-01-17 LAB — CBC WITH DIFFERENTIAL/PLATELET
Abs Immature Granulocytes: 0.03 10*3/uL (ref 0.00–0.07)
Basophils Absolute: 0 10*3/uL (ref 0.0–0.1)
Basophils Relative: 0 %
Eosinophils Absolute: 0.1 10*3/uL (ref 0.0–0.5)
Eosinophils Relative: 2 %
HCT: 35.5 % — ABNORMAL LOW (ref 36.0–46.0)
Hemoglobin: 11.5 g/dL — ABNORMAL LOW (ref 12.0–15.0)
Immature Granulocytes: 0 %
Lymphocytes Relative: 22 %
Lymphs Abs: 1.5 10*3/uL (ref 0.7–4.0)
MCH: 29.5 pg (ref 26.0–34.0)
MCHC: 32.4 g/dL (ref 30.0–36.0)
MCV: 91 fL (ref 80.0–100.0)
Monocytes Absolute: 1 10*3/uL (ref 0.1–1.0)
Monocytes Relative: 15 %
Neutro Abs: 4.1 10*3/uL (ref 1.7–7.7)
Neutrophils Relative %: 61 %
Platelets: 141 10*3/uL — ABNORMAL LOW (ref 150–400)
RBC: 3.9 MIL/uL (ref 3.87–5.11)
RDW: 13.5 % (ref 11.5–15.5)
WBC: 6.8 10*3/uL (ref 4.0–10.5)
nRBC: 0 % (ref 0.0–0.2)

## 2021-01-17 LAB — BASIC METABOLIC PANEL
Anion gap: 8 (ref 5–15)
BUN: 11 mg/dL (ref 6–20)
CO2: 26 mmol/L (ref 22–32)
Calcium: 8.8 mg/dL — ABNORMAL LOW (ref 8.9–10.3)
Chloride: 103 mmol/L (ref 98–111)
Creatinine, Ser: 0.89 mg/dL (ref 0.44–1.00)
GFR, Estimated: >60 mL/min (ref >60)
Glucose, Bld: 93 mg/dL (ref 70–99)
Potassium: 3.7 mmol/L (ref 3.5–5.1)
Sodium: 137 mmol/L (ref 135–145)

## 2021-01-17 LAB — TROPONIN I (HIGH SENSITIVITY)
Troponin I (High Sensitivity): 4 ng/L (ref <18)
Troponin I (High Sensitivity): 3 ng/L (ref <18)

## 2021-01-17 IMAGING — DX DG CHEST 1V PORT
1 series · 1 of 1 positions shown · non-contrast
Comparison: [DATE]

CLINICAL DATA: 54-year-old female with chest pain.

EXAM:
PORTABLE CHEST - 1 VIEW

[chest ap]
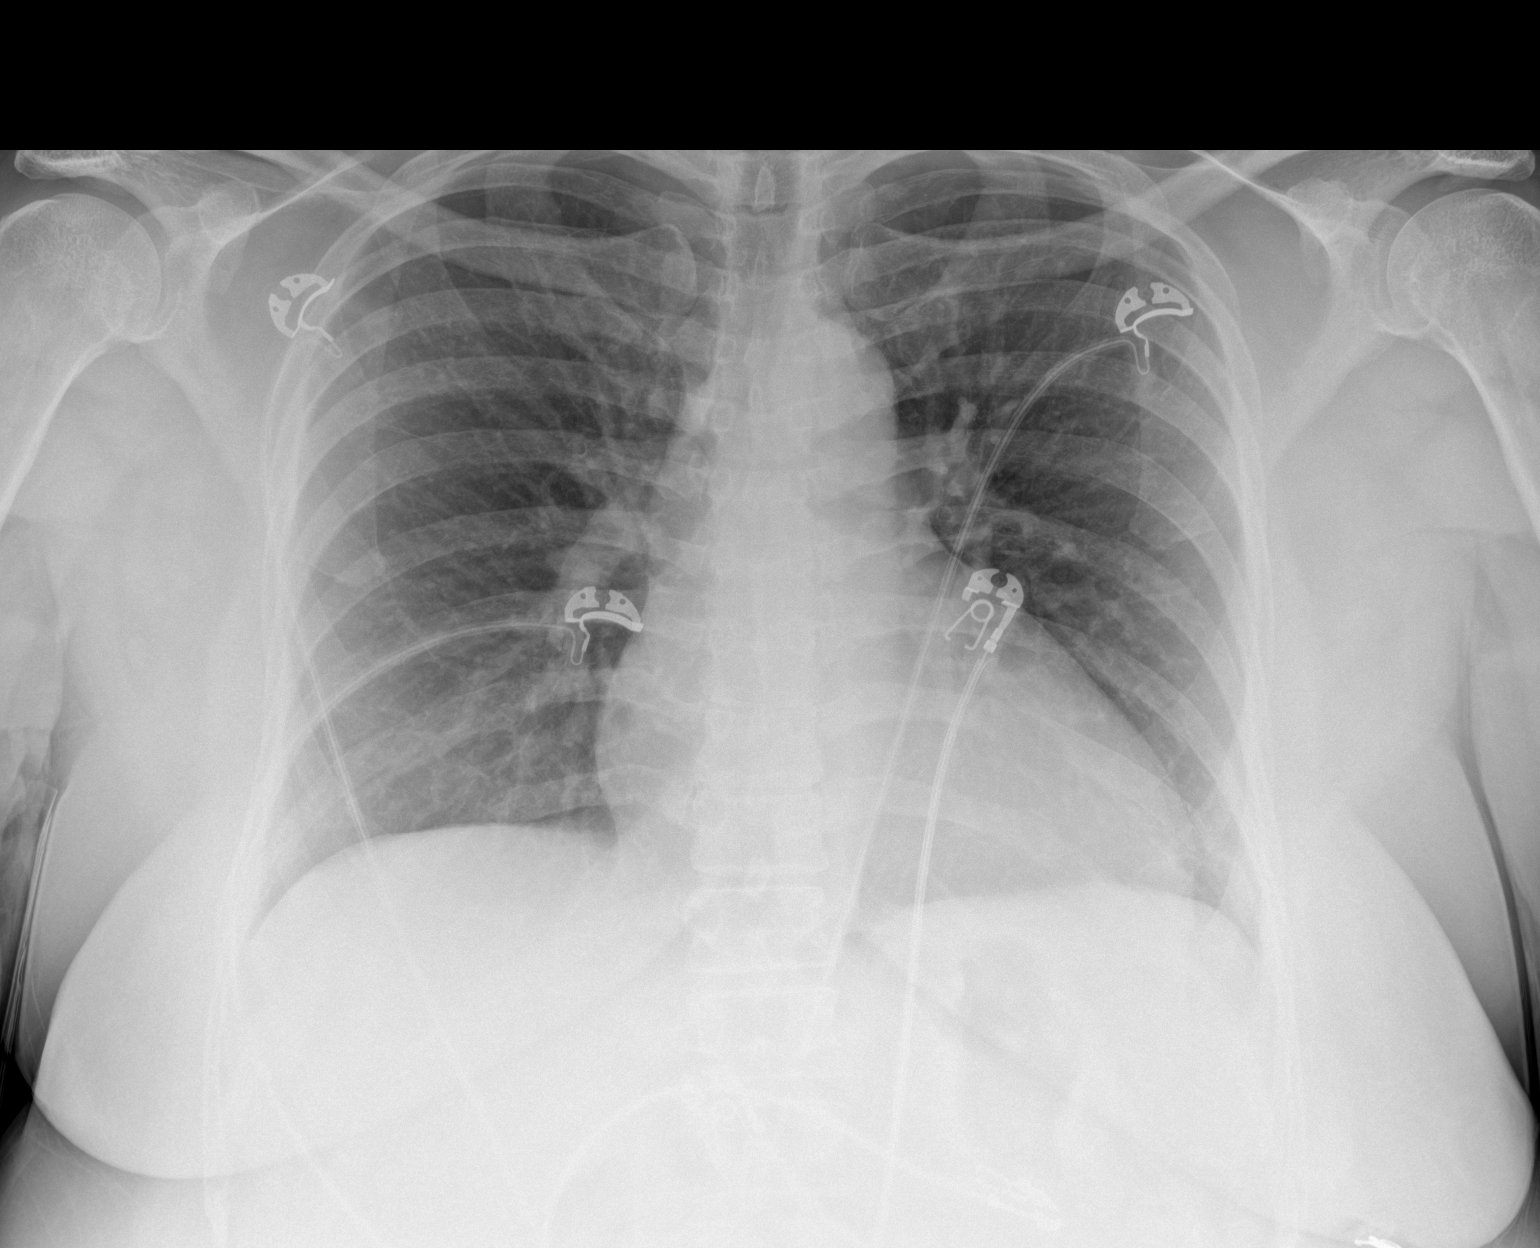

[1 of 1 positions shown; findings below may reference images not displayed]

FINDINGS: The mediastinal contours are within normal limits. No cardiomegaly.
The lungs are clear bilaterally without evidence of focal
consolidation, pleural effusion, or pneumothorax. No acute osseous
abnormality.
IMPRESSION: No acute cardiopulmonary process.

## 2021-01-17 NOTE — ED Triage Notes (Signed)
Pt reports CP that started this am and radiates down into her left arm. Pt reports pain is sharp in nature. Pt denies SOB, Nausea or radiating jaw pain

## 2021-01-17 NOTE — ED Provider Notes (Signed)
[facility] Emergency Department Provider Note   ____________________________________________   Event Date/Time   First MD Initiated Contact with Patient 01/17/21 [phone]     (approximate)  I have reviewed the triage vital signs and the nursing notes.   HISTORY  Chief Complaint Chest Pain    HPI Mary Henson is a 55 y.o. female with past medical history of hypertension, bipolar disorder, and OSA who presents to the ED complaining of chest pain.  Patient reports that she has had intermittent episodes of sharp pain in the left side of her chest since waking up this morning.  She states the pain will come on for a few seconds at a time and seems to radiate down her left arm.  It is not exacerbated or alleviated by anything in particular.  She denies any associated fevers, cough, shortness of breath, pain or swelling in her legs.  She states she has dealt with similar pain in the past and follows with Dr. Nehemiah Massed of cardiology, but states they have not been able to figure out what causes the pain.  She had Holter monitor evaluation that she was told was normal, has follow-up appointment at the end of June for echocardiogram.        Past Medical History:  Diagnosis Date  . Bipolar disorder ([place])   . Hypertension   . Sleep apnea     Patient Active Problem List   Diagnosis Date Noted  . Bipolar 1 disorder ([place]) 10/25/2020  . Psychosis ([place])   . Mania [facility])     Past Surgical History:  Procedure Laterality Date  . MYOMECTOMY    . UTERINE FIBROID EMBOLIZATION      Prior to Admission medications   Medication Sig Start Date End Date Taking? Authorizing Provider  amLODipine (NORVASC) 5 MG tablet Take 5 mg by mouth daily. 09/15/20 09/15/21  [provider]  atenolol (TENORMIN) 25 MG tablet Take 20 mg by mouth daily.    [provider]  ferrous sulfate 325 (65 FE) MG tablet Take 325 mg by mouth daily with breakfast.    [provider]   oxcarbazepine (TRILEPTAL) 600 MG tablet Take 600 mg by mouth 2 (two) times daily. 06/18/20   [provider]  QUEtiapine (SEROQUEL) 400 MG tablet Take 400 mg by mouth 2 (two) times daily. 06/14/20   [provider]  traZODone (DESYREL) 100 MG tablet Take 200 mg by mouth at bedtime. 11/01/17 12/27/20  [provider]    Allergies Lamotrigine, Penicillins, Clindamycin/lincomycin, Hydrochlorothiazide, Other, and Amiloride hydrochloride dihydrate [amiloride]  No family history on file.  Social History Social History   Tobacco Use  . Smoking status: Never Smoker  . Smokeless tobacco: Never Used  Vaping Use  . Vaping Use: Never used  Substance Use Topics  . Alcohol use: Never  . Drug use: Never    Review of Systems  Constitutional: No fever/chills Eyes: No visual changes. ENT: No sore throat. Cardiovascular: Positive for chest pain. Respiratory: Denies shortness of breath. Gastrointestinal: No abdominal pain.  No nausea, no vomiting.  No diarrhea.  No constipation. Genitourinary: Negative for dysuria. Musculoskeletal: Negative for back pain. Skin: Negative for rash. Neurological: Negative for headaches, focal weakness or numbness.  ____________________________________________   PHYSICAL EXAM:  VITAL SIGNS: ED Triage Vitals [01/17/21 0840]  Enc Vitals Group     BP      Pulse      Resp      Temp  Temp src      SpO2      Weight      Height      Head Circumference      Peak Flow      Pain Score 0     Pain Loc      Pain Edu?      Excl. in [place]?     Constitutional: Alert and oriented. Eyes: Conjunctivae are normal. Head: Atraumatic. Nose: No congestion/rhinnorhea. Mouth/Throat: Mucous membranes are moist. Neck: Normal ROM Cardiovascular: Normal rate, regular rhythm. Grossly normal heart sounds.  2+ radial pulses bilaterally. Respiratory: Normal respiratory effort.  No retractions. Lungs CTAB.  No chest wall tenderness to  palpation. Gastrointestinal: Soft and nontender. No distention. Genitourinary: deferred Musculoskeletal: No lower extremity tenderness nor edema. Neurologic:  Normal speech and language. No gross focal neurologic deficits are appreciated. Skin:  Skin is warm, dry and intact. No rash noted. Psychiatric: Mood and affect are normal. Speech and behavior are normal.  ____________________________________________   LABS (all labs ordered are listed, but only abnormal results are displayed)  Labs Reviewed  CBC WITH DIFFERENTIAL/PLATELET - Abnormal; Notable for the following components:      Result Value   Hemoglobin 11.5 (*)    HCT 35.5 (*)    Platelets 141 (*)    All other components within normal limits  BASIC METABOLIC PANEL - Abnormal; Notable for the following components:   Calcium 8.8 (*)    All other components within normal limits  TROPONIN I (HIGH SENSITIVITY)  TROPONIN I (HIGH SENSITIVITY)   ____________________________________________  EKG  ED ECG REPORT I, Blake Divine, the attending physician, personally viewed and interpreted this ECG.   Date: 01/17/2021  EKG Time: 8:48  Rate: 77  Rhythm: normal sinus rhythm  Axis: Normal  Intervals:none  ST&T Change: None   PROCEDURES  Procedure(s) performed (including Critical Care):  Procedures   ____________________________________________   INITIAL IMPRESSION / ASSESSMENT AND PLAN / ED COURSE       55 year old female with past medical history of hypertension, bipolar disorder, and OSA who presents to the ED with intermittent pain in her left chest since this morning that lasted for a few seconds at a time.  EKG shows no evidence of arrhythmia or ischemia, we will check labs including troponin along with chest x-Flater.  I have low suspicion for ACS given her atypical symptoms and if 2 sets troponin are negative, she would be appropriate for discharge home given heart score of less than 4.  Symptoms do not sound  consistent with PE or dissection given intermittent nature.  2 sets of troponin are negative and patient with no ongoing chest pain at this time.  She is appropriate for discharge home with cardiology follow-up, was counseled to return to the ED for new worsening symptoms, patient agrees with plan.      ____________________________________________   FINAL CLINICAL IMPRESSION(S) / ED DIAGNOSES  Final diagnoses:  Atypical chest pain     ED Discharge Orders    None       Note:  This document was prepared using Dragon voice recognition software and may include unintentional dictation errors.   Blake Divine, MD 01/17/21 1130

## 2021-02-02 ENCOUNTER — Encounter (INDEPENDENT_AMBULATORY_CARE_PROVIDER_SITE_OTHER): Payer: Self-pay

## 2021-07-03 ENCOUNTER — Emergency Department
Admission: EM | Admit: 2021-07-03 | Discharge: 2021-07-03 | Discharge status: Absent without leave | Payer: BC Managed Care – PPO | Source: Home / Self Care

## 2021-07-05 ENCOUNTER — Encounter: Payer: Self-pay | Admitting: Emergency Medicine

## 2021-07-05 ENCOUNTER — Emergency Department
Admission: EM | Admit: 2021-07-05 | Discharge: 2021-07-05 | Disposition: A | Discharge status: Home / Self Care | Payer: BLUE CROSS/BLUE SHIELD | Attending: Emergency Medicine | Admitting: Emergency Medicine

## 2021-07-05 ENCOUNTER — Other Ambulatory Visit: Payer: Self-pay

## 2021-07-05 DIAGNOSIS — M7989 Other specified soft tissue disorders: Secondary | ICD-10-CM | POA: Insufficient documentation

## 2021-07-05 DIAGNOSIS — R600 Localized edema: Secondary | ICD-10-CM | POA: Diagnosis present

## 2021-07-05 DIAGNOSIS — I10 Essential (primary) hypertension: Secondary | ICD-10-CM | POA: Insufficient documentation

## 2021-07-05 DIAGNOSIS — Z79899 Other long term (current) drug therapy: Secondary | ICD-10-CM | POA: Insufficient documentation

## 2021-07-05 LAB — BASIC METABOLIC PANEL
Anion gap: 5 (ref 5–15)
BUN: 16 mg/dL (ref 6–20)
CO2: 27 mmol/L (ref 22–32)
Calcium: 9.3 mg/dL (ref 8.9–10.3)
Chloride: 105 mmol/L (ref 98–111)
Creatinine, Ser: 0.83 mg/dL (ref 0.44–1.00)
GFR, Estimated: >60 mL/min (ref >60)
Glucose, Bld: 118 mg/dL — ABNORMAL HIGH (ref 70–99)
Potassium: 3.8 mmol/L (ref 3.5–5.1)
Sodium: 137 mmol/L (ref 135–145)

## 2021-07-05 LAB — CBC WITH DIFFERENTIAL/PLATELET
Abs Immature Granulocytes: 0.02 10*3/uL (ref 0.00–0.07)
Basophils Absolute: 0 10*3/uL (ref 0.0–0.1)
Basophils Relative: 0 %
Eosinophils Absolute: 0.1 10*3/uL (ref 0.0–0.5)
Eosinophils Relative: 1 %
HCT: 37.9 % (ref 36.0–46.0)
Hemoglobin: 12.4 g/dL (ref 12.0–15.0)
Immature Granulocytes: 0 %
Lymphocytes Relative: 36 %
Lymphs Abs: 2.8 10*3/uL (ref 0.7–4.0)
MCH: 31 pg (ref 26.0–34.0)
MCHC: 32.7 g/dL (ref 30.0–36.0)
MCV: 94.8 fL (ref 80.0–100.0)
Monocytes Absolute: 1.4 10*3/uL — ABNORMAL HIGH (ref 0.1–1.0)
Monocytes Relative: 18 %
Neutro Abs: 3.5 10*3/uL (ref 1.7–7.7)
Neutrophils Relative %: 45 %
Platelets: 137 10*3/uL — ABNORMAL LOW (ref 150–400)
RBC: 4 MIL/uL (ref 3.87–5.11)
RDW: 13.7 % (ref 11.5–15.5)
WBC: 7.8 10*3/uL (ref 4.0–10.5)
nRBC: 0 % (ref 0.0–0.2)

## 2021-07-05 NOTE — ED Triage Notes (Signed)
Pt presents to ER c/o BIL leg swelling x2 weeks. Pt noted to have +3 edema noted to BIL LE.  Pt states she has gained appx 48 lbs over 6-7 months.  Pt denies hx of CHF.  Pt states she has noticed more difficulty breathing when exerting herself.  Pt A&O x4 at this time, NAD noted.

## 2021-07-05 NOTE — ED Provider Notes (Signed)
[facility]  ____________________________________________   Event Date/Time   First MD Initiated Contact with Patient 07/05/21 0740     (approximate)  I have reviewed the triage vital signs and the nursing notes.   HISTORY  Chief Complaint Leg Swelling    HPI Mary Henson is a 55 y.o. female with with past medical history of hypertension who presents with bilateral lower extremity edema x1 month.  Patient notes that she has had leg swelling in the past but over the last month it has been worse than normal.  It is bilateral and symmetric includes the feet up to her knees.  She denies associated pain in her legs.  Seems to have started when she was started on both metoprolol and lisinopril.  Denies any history of heart failure or liver disease or kidney disease.  She denies associated shortness of breath fevers or chills.  No orthopnea or PND.  She presents today because she specifically noticed some black lines on the skin near her ankles and was concerned about her circulation       Past Medical History:  Diagnosis Date   Bipolar disorder ([place])    Hypertension    Sleep apnea     Patient Active Problem List   Diagnosis Date Noted   Bipolar 1 disorder ([place]) 10/25/2020   Psychosis ([place])    Mania ([place])     Past Surgical History:  Procedure Laterality Date   MYOMECTOMY     UTERINE FIBROID EMBOLIZATION      Prior to Admission medications   Medication Sig Start Date End Date Taking? Authorizing Provider  amLODipine (NORVASC) 5 MG tablet Take 5 mg by mouth daily. 09/15/20 09/15/21  [provider]  atenolol (TENORMIN) 25 MG tablet Take 20 mg by mouth daily.    [provider]  ferrous sulfate 325 (65 FE) MG tablet Take 325 mg by mouth daily with breakfast.    [provider]  oxcarbazepine (TRILEPTAL) 600 MG tablet Take 600 mg by mouth 2 (two) times daily. 06/18/20   [provider]  QUEtiapine (SEROQUEL) 400 MG  tablet Take 400 mg by mouth 2 (two) times daily. 06/14/20   [provider]  traZODone (DESYREL) 100 MG tablet Take 200 mg by mouth at bedtime. 11/01/17 12/27/20  [provider]    Allergies Lamotrigine, Penicillins, Clindamycin/lincomycin, Hydrochlorothiazide, Other, and Amiloride hydrochloride dihydrate [amiloride]  History reviewed. No pertinent family history.  Social History Social History   Tobacco Use   Smoking status: Never   Smokeless tobacco: Never  Vaping Use   Vaping Use: Never used  Substance Use Topics   Alcohol use: Never   Drug use: Never    Review of Systems   Review of Systems  Constitutional:  Negative for chills and fever.  Respiratory:  Negative for shortness of breath.   Cardiovascular:  Positive for leg swelling.  Gastrointestinal:  Negative for abdominal pain.  Musculoskeletal:  Negative for arthralgias, joint swelling and myalgias.  All other systems reviewed and are negative.  Physical Exam Updated Vital Signs BP (!) 164/85   Pulse (!) 59   Temp 97.7 F (36.5 C) (Oral)   Resp 20   Ht 5\' 4"  (1.626 m)   Wt 103 kg   LMP 05/05/2021 (Approximate)   SpO2 97%   BMI 38.96 kg/m   Physical Exam Vitals and nursing note reviewed.  Constitutional:      General: She is not in acute distress.  Appearance: Normal appearance.  HENT:     Head: Normocephalic and atraumatic.  Eyes:     General: No scleral icterus.    Conjunctiva/sclera: Conjunctivae normal.  Pulmonary:     Effort: Pulmonary effort is normal. No respiratory distress.     Breath sounds: No stridor.  Musculoskeletal:        General: No deformity or signs of injury.     Cervical back: Normal range of motion.     Right lower leg: Edema present.     Left lower leg: Edema present.     Comments: Bilateral lower extremity swelling with 1+ edema in the feet, no erythema or tenderness to palpation, extremities are warm with good cap refill  Skin:    General: Skin is dry.      Coloration: Skin is not jaundiced or pale.  Neurological:     General: No focal deficit present.     Mental Status: She is alert and oriented to person, place, and time. Mental status is at baseline.  Psychiatric:        Mood and Affect: Mood normal.        Behavior: Behavior normal.     LABS (all labs ordered are listed, but only abnormal results are displayed)  Labs Reviewed  BASIC METABOLIC PANEL - Abnormal; Notable for the following components:      Result Value   Glucose, Bld 118 (*)    All other components within normal limits  CBC WITH DIFFERENTIAL/PLATELET - Abnormal; Notable for the following components:   Platelets 137 (*)    Monocytes Absolute 1.4 (*)    All other components within normal limits   ____________________________________________  EKG N/a ____________________________________________  RADIOLOGY Almeta Monas, personally viewed and evaluated these images (plain radiographs) as part of my medical decision making, as well as reviewing the written report by the radiologist.  ED MD interpretation:  n/a    ____________________________________________   PROCEDURES  Procedure(s) performed (including Critical Care):  Procedures   ____________________________________________   INITIAL IMPRESSION / ASSESSMENT AND PLAN / ED COURSE     55 year old female presents with 1 month of bilateral lower extremity swelling.  She is otherwise asymptomatic without symptoms of CHF.  Shortness of breath.  She appears well vital signs are notable for hypertension but otherwise within normal limits.  She does have symmetric bilateral lower extremity swelling involving the feet and calves.  Extremities are nontender.  Her labs including a BMP and CBC are within normal limits.  Creatinine normal.  Patient has no risk factors or history of liver disease.  Differential includes venous insufficiency, lymphedema, CHF, pulmonary hypertension etc.  Given she is  asymptomatic do not feel that any other investigation is necessary at this point.  I did discuss with her following up with both her primary care provider and cardiologist that she may need to have an echocardiogram at some point.  I advised keeping the legs elevated and compression stockings.      ____________________________________________   FINAL CLINICAL IMPRESSION(S) / ED DIAGNOSES  Final diagnoses:  Lower extremity edema     ED Discharge Orders          Ordered    Compression stockings        07/05/21 0823             Note:  This document was prepared using Dragon voice recognition software and may include unintentional dictation errors.    Rada Hay, MD 07/05/21 [phone]

## 2021-07-05 NOTE — Discharge Instructions (Signed)
Your blood counts and your basic chemistry panel was normal.  It is possible that your swelling is from poor venous or lymphatic drainage.  Please follow-up with your primary care provider and your cardiologist as you may need to have an echocardiogram to make sure your heart is functioning properly.  In the meantime please utilize compression stockings and elevate your legs.

## 2021-07-05 NOTE — ED Provider Notes (Signed)
Emergency Medicine Provider Triage Evaluation Note  Mary KE , a 55 y.o. female  was evaluated in triage.  Pt complains of bilateral ankle swelling x2 weeks.  Some recent changes to blood pressure medication.  No diuretics.  Review of Systems  Positive: Bilateral ankle swelling.  Negative: Shortness of breath, chest pain, nausea, vomiting, abdominal pain.  Physical Exam  BP (!) 196/106 (BP Location: Left Arm)   Pulse 69   Temp 98.3 F (36.8 C) (Oral)   Resp 18   Ht 1.626 m (5\' 4" )   Wt 103 kg   LMP 11/02/2019   SpO2 97%   BMI 38.96 kg/m  Gen:   Awake, no distress   Resp:  Normal effort , no wheezing. MSK:   2+ pitting edema in bilateral lower extremities including feet.  No evidence of infection.  Edema is equal in both extremities. Other:  Alert, oriented, pleasant, no psychiatric abnormalities nor neurological deficits appreciated.  Medical Decision Making  Medically screening exam initiated at 1:15 AM.  Appropriate orders placed.  AUGUSTA MIRKIN was informed that the remainder of the evaluation will be completed by another provider, this initial triage assessment does not replace that evaluation, and the importance of remaining in the ED until their evaluation is complete.     Mary Kehr, MD 07/05/21 [phone]

## 2021-08-25 ENCOUNTER — Other Ambulatory Visit: Payer: Self-pay | Admitting: Certified Nurse Midwife

## 2021-08-25 DIAGNOSIS — Z1231 Encounter for screening mammogram for malignant neoplasm of breast: Secondary | ICD-10-CM

## 2022-03-18 ENCOUNTER — Other Ambulatory Visit: Payer: Self-pay | Admitting: Obstetrics and Gynecology

## 2022-03-30 ENCOUNTER — Other Ambulatory Visit: Payer: BLUE CROSS/BLUE SHIELD

## 2022-04-15 ENCOUNTER — Other Ambulatory Visit: Payer: Self-pay | Admitting: Physician Assistant

## 2022-04-15 DIAGNOSIS — R7989 Other specified abnormal findings of blood chemistry: Secondary | ICD-10-CM

## 2022-04-18 ENCOUNTER — Ambulatory Visit
Admission: RE | Admit: 2022-04-18 | Discharge: 2022-04-18 | Disposition: A | Discharge status: Home / Self Care | Payer: Medicare (Managed Care) | Source: Ambulatory Visit | Attending: Certified Nurse Midwife | Admitting: Certified Nurse Midwife

## 2022-04-18 DIAGNOSIS — Z1231 Encounter for screening mammogram for malignant neoplasm of breast: Secondary | ICD-10-CM | POA: Diagnosis present

## 2022-04-19 ENCOUNTER — Encounter
Admission: RE | Admit: 2022-04-19 | Discharge: 2022-04-19 | Disposition: A | Discharge status: Home / Self Care | Payer: Medicare (Managed Care) | Source: Ambulatory Visit | Attending: Obstetrics and Gynecology | Admitting: Obstetrics and Gynecology

## 2022-04-19 VITALS — Ht 64.0 in | Wt 207.0 lb

## 2022-04-19 DIAGNOSIS — Z01818 Encounter for other preprocedural examination: Secondary | ICD-10-CM

## 2022-04-19 DIAGNOSIS — R7303 Prediabetes: Secondary | ICD-10-CM

## 2022-04-19 HISTORY — DX: Anemia, unspecified: D64.9

## 2022-04-19 NOTE — Patient Instructions (Addendum)
Your procedure is scheduled on: 04/29/22 Report to [facility]. To find out your arrival time please call [phone] between 1PM - 3PM on 04/28/22 .  Remember: Instructions that are not followed completely may result in serious medical risk, up to and including death, or upon the discretion of your surgeon and anesthesiologist your surgery may need to be rescheduled.     _X__ 1. Do not eat food or drink any liquids after midnight the night before your procedure.                 No gum chewing or hard candies.   __X__2.  On the morning of surgery brush your teeth with toothpaste and water, you                 may rinse your mouth with mouthwash if you wish.  Do not swallow any              toothpaste of mouthwash.     _X__ 3.  No Alcohol for 24 hours before or after surgery.   _X__ 4.  Do Not Smoke or use e-cigarettes For 24 Hours Prior to Your Surgery.                 Do not use any chewable tobacco products for at least 6 hours prior to                 surgery.  ____  5.  Bring all medications with you on the day of surgery if instructed.   __X__  6.  Notify your doctor if there is any change in your medical condition      (cold, fever, infections).     Do not wear jewelry, make-up, hairpins, clips or nail polish. Do not wear lotions, powders, or perfumes.  Do not shave body hair 48 hours prior to surgery. Men may shave face and neck. Do not bring valuables to the hospital.    [facility] is not responsible for any belongings or valuables.  Contacts, dentures/partials or body piercings may not be worn into surgery. Bring a case for your contacts, glasses or hearing aids, a denture cup will be supplied. Leave your suitcase in the car. After surgery it may be brought to your room. For patients admitted to the hospital, discharge time is determined by your treatment team.   Patients discharged the day of surgery will not be allowed  to drive home.    __X__ Take these medicines the morning of surgery with A SIP OF WATER:    1. benztropine (COGENTIN) 1 MG tablet  2. LABETALOL HCL PO  3. hydrOXYzine (ATARAX) 25 MG tablet if needed  4.  5.  6.  ____ Fleet Enema (as directed)   ____ Use CHG Soap/SAGE wipes as directed  ____ Use inhalers on the day of surgery  __X__ Stop metformin/Janumet/Farxiga 2 days prior to surgery    ____ Take 1/2 of usual insulin dose the night before surgery. No insulin the morning          of surgery.   ____ Stop Blood Thinners Coumadin/Plavix/Xarelto/Pleta/Pradaxa/Eliquis/Effient/Aspirin  on   Or contact your Surgeon, Cardiologist or Medical Doctor regarding  ability to stop your blood thinners  __X__ Stop Anti-inflammatories 7 days before surgery such as Advil, Ibuprofen, Motrin,  BC or Goodies Powder, Naprosyn, Naproxen, Aleve, Aspirin    __X__ Stop all herbal supplements, fish oil or vitamin E  until after surgery.    ____ Bring C-Pap to the hospital.

## 2022-04-19 NOTE — H&P (Signed)
Chief Complaint:   Mary Henson is a 56 y.o. female here for Pre Op Consulting (Sign consents) .   History of Present Illness: Patient presents for a preoperative visit to schedule a D&C, hysteroscopy with myosure polypectomy. Indications for surgery: AUB, endometrial polyp.   Her story:  - Still has irregular bleeding every 3 months, last period in August and lasts for 3-5 days. Bleeding is usually heavy, no clots, has to change pads twice daily.  - Problem with menses: irregular, used to be every 5-6 weeks, but over the last 10 years they have increased to every 2 months then every 3 months. - She was told she had fibroids that needed to be removed surgically 2016. She has not had any follow-up or improvement in bleeding.  - Her mother was about 40-57 years old when she went through menopause   TVUS, saline 01/2022 - Uterus: 8.21 x 6.36 x 6.73 cm  - Endometrium: 4.36 mm  - Ovaries: right simple cyst 2.1 cm, Lt complex cyst 1.5 cm  - Other: fibroids seen 1)lt lat 2.7 cm, 2)post 4.6 cm    EMBx 01/2022  BENIGN ENDOMETRIAL POLYP. DISORDERED PROLIFERATIVE PHASE ENDOMETRIUM. NO HYPERPLASIA, CARCINOMA OR ENDOMETRITIS.    Pertinent Hx: Last Pap smear: 11/2019 neg/neg   Past Medical History:  has a past medical history of Anxiety, COVID-19 (05/2021), Fibroid tumor (2006), and Hypertension.  Past Surgical History:  has a past surgical history that includes myomectomy abdominal and fibroid surgery. Family History: family history includes Colon cancer in her mother; Diabetes type II in her father; Heart failure in her father; High blood pressure (Hypertension) in her father and mother. Social History:  reports that she has never smoked. She has never used smokeless tobacco. She reports that she does not drink alcohol and does not use drugs. OB/GYN History:   OB History        Gravida 0   Para 0   Term 0   Preterm 0   AB 0   Living 0         SAB 0   IAB 0   Ectopic 0   Molar 0   Multiple 0   Live Births 0           Allergies: is allergic to penicillins, clindamycin, lamictal [lamotrigine], and hydrochlorothiazide. Medications:   Current Outpatient Medications:    benztropine (COGENTIN) 0.5 MG tablet, Take 0.5 mg by mouth once daily, Disp: , Rfl:    benztropine (COGENTIN) 1 MG tablet, Take 1 mg by mouth 2 (two) times daily, Disp: , Rfl:    ferrous sulfate 325 (65 FE) MG tablet, Take 325 mg by mouth daily with breakfast, Disp: , Rfl:    FUROsemide (LASIX) 40 MG tablet, Take 40 mg by mouth once daily, Disp: , Rfl:    hydrOXYzine (ATARAX) 25 MG tablet, Take 25 mg by mouth every 8 (eight) hours as needed for Anxiety, Disp: , Rfl:    labetaloL (TRANDATE) 200 MG tablet, Take 1 tablet (200 mg total) by mouth 2 (two) times daily, Disp: 60 tablet, Rfl: 11   lisinopriL (ZESTRIL) 30 MG tablet, Take 1 tablet by mouth once daily, Disp: 90 tablet, Rfl: 0   metFORMIN (GLUCOPHAGE) 500 MG tablet, Take 1 tablet (500 mg total) by mouth 2 (two) times daily with meals, Disp: , Rfl:    norethindrone (MICRONOR) 0.35 mg tablet, Take 1 tablet (0.35 mg total) by mouth once daily, Disp: 84 tablet, Rfl: 3  OLANZapine (ZYPREXA) 15 MG tablet, Take 15 mg by mouth 2 (two) times daily, Disp: , Rfl:    OLANZapine (ZYPREXA) 20 MG tablet, , Disp: , Rfl:    triamcinolone (NASACORT AQ) 55 mcg nasal spray, Place 2 sprays into both nostrils once daily, Disp: 10.8 mL, Rfl: 12   divalproex (DEPAKOTE ER) 500 MG ER tablet, Take 1,500 mg by mouth at bedtime, Disp: , Rfl:    Review of Systems: No SOB, no palpitations or chest pain, no new lower extremity edema, no nausea or vomiting or bowel or bladder complaints. See HPI for gyn specific ROS.     Exam:   BP (!) 140/86   Pulse 75   Ht 162.6 cm ('5\' 4"'$ )   Wt 93.8 kg (206 lb 12.8 oz)   BMI 35.50 kg/m    General: Patient is well-groomed, well-nourished, appears stated age in no acute distress    HEENT: head is atraumatic and normocephalic, trachea is midline, neck is supple with no palpable nodules   CV: Regular rhythm and normal heart rate, no murmur   Pulm: Clear to auscultation throughout lung fields with no wheezing, crackles, or rhonchi. No increased work of breathing   Abdomen: soft , no mass, non-tender, no rebound tenderness, no hepatomegaly   Pelvic: deferred     Impression:   The primary encounter diagnosis was Preop examination. Diagnoses of Endometrial polyp and Abnormal uterine bleeding (AUB) were also pertinent to this visit.    Plan:   1.  Preoperative visit: D&C hysteroscopy with myosure polypectomy. Consents signed today.  -Risks of surgery were discussed with the patient including but not limited to: bleeding which may require transfusion; infection which may require antibiotics; injury to uterus or surrounding organs; intrauterine scarring which may impair future fertility; need for additional procedures including laparotomy or laparoscopy; and other postoperative/anesthesia complications. Written informed consent was obtained.   This is a scheduled same-day surgery. She will have a postop visit in 2 weeks to review operative findings and pathology.   Return for Postop check.

## 2022-04-20 ENCOUNTER — Inpatient Hospital Stay
Admission: RE | Admit: 2022-04-20 | Discharge: 2022-04-20 | Disposition: A | Discharge status: Home / Self Care | Payer: Self-pay | Source: Ambulatory Visit | Attending: *Deleted | Admitting: *Deleted

## 2022-04-20 ENCOUNTER — Other Ambulatory Visit: Payer: Self-pay | Admitting: *Deleted

## 2022-04-20 DIAGNOSIS — Z1231 Encounter for screening mammogram for malignant neoplasm of breast: Secondary | ICD-10-CM

## 2022-04-21 ENCOUNTER — Encounter: Payer: Self-pay | Admitting: Urgent Care

## 2022-04-21 ENCOUNTER — Ambulatory Visit
Admission: RE | Admit: 2022-04-21 | Discharge: 2022-04-21 | Disposition: A | Discharge status: Home / Self Care | Payer: Medicare (Managed Care) | Source: Ambulatory Visit | Attending: Physician Assistant | Admitting: Physician Assistant

## 2022-04-21 ENCOUNTER — Encounter
Admission: RE | Admit: 2022-04-21 | Discharge: 2022-04-21 | Disposition: A | Discharge status: Home / Self Care | Payer: Medicare (Managed Care) | Source: Ambulatory Visit | Attending: Obstetrics and Gynecology | Admitting: Obstetrics and Gynecology

## 2022-04-21 ENCOUNTER — Other Ambulatory Visit: Payer: Self-pay

## 2022-04-21 DIAGNOSIS — I10 Essential (primary) hypertension: Secondary | ICD-10-CM | POA: Diagnosis not present

## 2022-04-21 DIAGNOSIS — N950 Postmenopausal bleeding: Secondary | ICD-10-CM | POA: Insufficient documentation

## 2022-04-21 DIAGNOSIS — Z01818 Encounter for other preprocedural examination: Secondary | ICD-10-CM | POA: Diagnosis not present

## 2022-04-21 DIAGNOSIS — Z01812 Encounter for preprocedural laboratory examination: Secondary | ICD-10-CM

## 2022-04-21 DIAGNOSIS — Z01810 Encounter for preprocedural cardiovascular examination: Secondary | ICD-10-CM

## 2022-04-21 DIAGNOSIS — R7989 Other specified abnormal findings of blood chemistry: Secondary | ICD-10-CM

## 2022-04-21 LAB — CBC
HCT: 38.7 % (ref 36.0–46.0)
Hemoglobin: 12.5 g/dL (ref 12.0–15.0)
MCH: 30.9 pg (ref 26.0–34.0)
MCHC: 32.3 g/dL (ref 30.0–36.0)
MCV: 95.8 fL (ref 80.0–100.0)
Platelets: 137 10*3/uL — ABNORMAL LOW (ref 150–400)
RBC: 4.04 MIL/uL (ref 3.87–5.11)
RDW: 13.1 % (ref 11.5–15.5)
WBC: 6.3 10*3/uL (ref 4.0–10.5)
nRBC: 0 % (ref 0.0–0.2)

## 2022-04-21 LAB — BASIC METABOLIC PANEL
Anion gap: 7 (ref 5–15)
BUN: 9 mg/dL (ref 6–20)
CO2: 28 mmol/L (ref 22–32)
Calcium: 9.2 mg/dL (ref 8.9–10.3)
Chloride: 107 mmol/L (ref 98–111)
Creatinine, Ser: 1.13 mg/dL — ABNORMAL HIGH (ref 0.44–1.00)
GFR, Estimated: 57 mL/min — ABNORMAL LOW (ref >60)
Glucose, Bld: 91 mg/dL (ref 70–99)
Potassium: 3.9 mmol/L (ref 3.5–5.1)
Sodium: 142 mmol/L (ref 135–145)

## 2022-04-29 DIAGNOSIS — N950 Postmenopausal bleeding: Secondary | ICD-10-CM

## 2022-04-29 DIAGNOSIS — I10 Essential (primary) hypertension: Secondary | ICD-10-CM

## 2022-06-07 NOTE — H&P (Signed)
Mary Henson is a 56 y.o. female presenting with Pre Op Consulting   History of Present Illness: Patient returns today for preop exam for [facility] with hysteroscopy and polypectomy. She had previously been scheduled for same on 04/29/22.   Her Story: - Still has irregular bleeding every 3 months, last period in August and lasts for 3-5 days. Bleeding is usually heavy, no clots, has to change pads twice daily.  - Problem with menses: irregular, used to be every 5-6 weeks, but over the last 10 years they have increased to every 2 months then every 3 months. - She was told she had fibroids that needed to be removed surgically 2016. She has not had any follow-up or improvement in bleeding.  - Her mother was about 30-9 years old when she went through menopause   Workup: Pap 11/2019  Neg/neg  EMBx 01/2022 BENIGN ENDOMETRIAL POLYP. DISORDERED PROLIFERATIVE PHASE ENDOMETRIUM. NO HYPERPLASIA, CARCINOMA OR ENDOMETRITIS.   TVUS 01/2022  - Uterus: 8.21 x 6.36 x 6.73 cm  - Endometrium: 4.36 mm  - Ovaries: right simple cyst 2.1 cm, Lt complex cyst 1.5 cm  - Other: fibroids seen 1)lt lat 2.7 cm, 2)post 4.6 cm    Past Medical History:  has a past medical history of Anxiety, COVID-19 (05/2021), Fibroid tumor (2006), and Hypertension.  Past Surgical History:  has a past surgical history that includes myomectomy abdominal and fibroid surgery. Family History: family history includes Colon cancer in her mother; Diabetes type II in her father; Heart failure in her father; High blood pressure (Hypertension) in her father and mother. Social History:  reports that she has never smoked. She has never been exposed to tobacco smoke. She has never used smokeless tobacco. She reports that she does not drink alcohol and does not use drugs. OB/GYN History:  OB History       Gravida 0   Para 0   Term 0   Preterm 0   AB 0   Living 0        SAB 0   IAB 0   Ectopic 0   Molar 0   Multiple 0   Live Births 0        Allergies: is allergic to penicillins, clindamycin, lamictal [lamotrigine], and hydrochlorothiazide. Medications: Current Outpatient Medications:    benztropine (COGENTIN) 1 MG tablet, Take 1 mg by mouth 2 (two) times daily, Disp: , Rfl:    ferrous sulfate 325 (65 FE) MG tablet, Take 325 mg by mouth daily with breakfast, Disp: , Rfl:    FUROsemide (LASIX) 40 MG tablet, Take 1 tablet (40 mg total) by mouth once daily as needed for Edema, Disp: 30 tablet, Rfl: 3   hydrOXYzine (ATARAX) 25 MG tablet, Take 25 mg by mouth every 8 (eight) hours as needed for Anxiety, Disp: , Rfl:    labetaloL (TRANDATE) 200 MG tablet, Take 1 tablet (200 mg total) by mouth 2 (two) times daily, Disp: 60 tablet, Rfl: 11   lisinopriL (ZESTRIL) 30 MG tablet, Take 1 tablet (30 mg total) by mouth once daily, Disp: 90 tablet, Rfl: 1   metFORMIN (GLUCOPHAGE) 500 MG tablet, Take 1 tablet (500 mg total) by mouth 2 (two) times daily with meals, Disp: , Rfl:    norethindrone (MICRONOR) 0.35 mg tablet, Take 1 tablet (0.35 mg total) by mouth once daily, Disp: 84 tablet, Rfl: 3   OLANZapine (ZYPREXA) 20 MG tablet, Take 20 mg by mouth at bedtime, Disp: , Rfl:    triamcinolone (NASACORT  AQ) 55 mcg nasal spray, Place 2 sprays into both nostrils once daily, Disp: 10.8 mL, Rfl: 12   divalproex (DEPAKOTE ER) 500 MG ER tablet, Take 1,500 mg by mouth at bedtime, Disp: , Rfl:    Review of Systems: No SOB, no palpitations or chest pain, no new lower extremity edema, no nausea or vomiting or bowel or bladder complaints. See HPI for gyn specific ROS.    Exam:   BP (!) 140/81   Pulse 86   Ht 162.6 cm ('5\' 4"'$ )   Wt 94 kg (207 lb 3.2 oz)   BMI 35.57 kg/m    General: Patient is well-groomed, well-nourished, appears stated age in no acute distress   HEENT: head is atraumatic and normocephalic, trachea is midline, neck is supple with no palpable  nodules   CV: Regular rhythm and normal heart rate, no murmur   Pulm: Clear to auscultation throughout lung fields with no wheezing, crackles, or rhonchi. No increased work of breathing   Abdomen: soft , no mass, non-tender, no rebound tenderness, no hepatomegaly     Impression:   The primary encounter diagnosis was Preop examination. Diagnoses of Endometrial polyp and Abnormal uterine bleeding (AUB) were also pertinent to this visit.   Plan:   Endometrial polyp, PMB. Preoperative visit: D&C hysteroscopy, Consents signed today.  -Risks of surgery were discussed with the patient including but not limited to: bleeding which may require transfusion; infection which may require antibiotics; injury to uterus or surrounding organs; intrauterine scarring which may impair future fertility; need for additional procedures including laparotomy or laparoscopy; and other postoperative/anesthesia complications. Written informed consent was obtained.   This is a scheduled same-day surgery. She will have a postop visit in 2 weeks to review operative findings and pathology.   Diagnoses and all orders for this visit:   Preop examination   Endometrial polyp   Abnormal uterine bleeding (AUB)      Return for Postop check.

## 2022-06-09 ENCOUNTER — Encounter
Admission: RE | Admit: 2022-06-09 | Discharge: 2022-06-09 | Disposition: A | Discharge status: Home / Self Care | Payer: Medicare (Managed Care) | Source: Ambulatory Visit | Attending: Obstetrics and Gynecology | Admitting: Obstetrics and Gynecology

## 2022-06-09 ENCOUNTER — Other Ambulatory Visit: Payer: Self-pay

## 2022-06-09 NOTE — Patient Instructions (Addendum)
Your procedure is scheduled on: OCTOBER 27,2023 FRIDAY  Report to the Registration Desk on the 1st floor of the [facility]. To find out your arrival time, please call [phone] between [place] on: THURSDAY June 16, 2022 If your arrival time is 6:00 am, do not arrive prior to that time as the [facility] entrance doors do not open until 6:00 am.  REMEMBER: Instructions that are not followed completely may result in serious medical risk, up to and including death; or upon the discretion of your surgeon and anesthesiologist your surgery may need to be rescheduled.  Do not eat food after midnight the night before surgery.  No gum chewing, lozengers or hard candies.  You may however, drink CLEAR liquids up to 2 hours before you are scheduled to arrive for your surgery. Do not drink anything within 2 hours of your scheduled arrival time.  Clear liquids include: - water   TAKE THESE MEDICATIONS THE MORNING OF SURGERY WITH A SIP OF WATER: COGENTIN LABETALOL HYDROXYZINE  STOP METFORMIN TWO DAYS BEFORE SURGERY. LAST DOSE 06/14/2022 TUESDAY.  One week prior to surgery: Stop Anti-inflammatories (NSAIDS) such as Advil, Aleve, Ibuprofen, Motrin, Naproxen, Naprosyn and Aspirin based products such as Excedrin, Goodys Powder, BC Powder. Stop ANY OVER THE COUNTER supplements until after surgery. You may however, continue to take Tylenol if needed for pain up until the day of surgery.  No Alcohol for 24 hours before or after surgery.  No Smoking including e-cigarettes for 24 hours prior to surgery.  No chewable tobacco products for at least 6 hours prior to surgery.  No nicotine patches on the day of surgery.  Do not use any "recreational" drugs for at least a week prior to your surgery.  Please be advised that the combination of cocaine and anesthesia may have negative outcomes, up to and including death. If you test positive for cocaine, your surgery will be cancelled.  On the  morning of surgery brush your teeth with toothpaste and water, you may rinse your mouth with mouthwash if you wish. Do not swallow any toothpaste or mouthwash.  SHOWER DAY OF SURGERY   Do not wear jewelry, make-up, hairpins, clips or nail polish.  Do not wear lotions, powders, or perfumes OR DEODORANT  Do not shave body from the neck down 48 hours prior to surgery just in case you cut yourself which could leave a site for infection.  Also, freshly shaved skin may become irritated if using the CHG soap.  Contact lenses, hearing aids and dentures may not be worn into surgery.  Do not bring valuables to the hospital. [facility] is not responsible for any missing/lost belongings or valuables.   Bring your C-PAP to the hospital with you in case you may have to spend the night.   Notify your doctor if there is any change in your medical condition (cold, fever, infection).  Wear comfortable clothing (specific to your surgery type) to the hospital.  After surgery, you can help prevent lung complications by doing breathing exercises.  Take deep breaths and cough every 1-2 hours. Your doctor may order a device called an Incentive Spirometer to help you take deep breaths. When coughing or sneezing, hold a pillow firmly against your incision with both hands. This is called "splinting." Doing this helps protect your incision. It also decreases belly discomfort.  If you are being discharged the day of surgery, you will not be allowed to drive home. You will need a responsible adult (  18 years or older) to drive you home and stay with you that night.   If you are taking public transportation, you will need to have a responsible adult (18 years or older) with you. Please confirm with your physician that it is acceptable to use public transportation.   Please call the [facility]. at [phone] if you have any questions about these instructions.  Surgery Visitation  Policy:  Patients undergoing a surgery or procedure may have two family members or support persons with them as long as the person is not COVID-19 positive or experiencing its symptoms.

## 2022-06-17 ENCOUNTER — Ambulatory Visit: Payer: Medicare (Managed Care) | Admitting: Certified Registered"

## 2022-06-17 ENCOUNTER — Ambulatory Visit
Admission: RE | Admit: 2022-06-17 | Discharge: 2022-06-17 | Disposition: A | Discharge status: Home / Self Care | Payer: Medicare (Managed Care) | Attending: Obstetrics and Gynecology | Admitting: Obstetrics and Gynecology

## 2022-06-17 ENCOUNTER — Encounter
Admission: RE | Disposition: A | Discharge status: Home / Self Care | Payer: Self-pay | Source: Home / Self Care | Attending: Obstetrics and Gynecology

## 2022-06-17 ENCOUNTER — Other Ambulatory Visit: Payer: Self-pay

## 2022-06-17 ENCOUNTER — Encounter: Payer: Self-pay | Admitting: Obstetrics and Gynecology

## 2022-06-17 DIAGNOSIS — I10 Essential (primary) hypertension: Secondary | ICD-10-CM | POA: Insufficient documentation

## 2022-06-17 DIAGNOSIS — N840 Polyp of corpus uteri: Secondary | ICD-10-CM | POA: Diagnosis not present

## 2022-06-17 DIAGNOSIS — G4730 Sleep apnea, unspecified: Secondary | ICD-10-CM | POA: Diagnosis not present

## 2022-06-17 DIAGNOSIS — D649 Anemia, unspecified: Secondary | ICD-10-CM | POA: Diagnosis not present

## 2022-06-17 DIAGNOSIS — D259 Leiomyoma of uterus, unspecified: Secondary | ICD-10-CM | POA: Insufficient documentation

## 2022-06-17 DIAGNOSIS — Z01818 Encounter for other preprocedural examination: Secondary | ICD-10-CM

## 2022-06-17 DIAGNOSIS — Z01812 Encounter for preprocedural laboratory examination: Secondary | ICD-10-CM

## 2022-06-17 DIAGNOSIS — N950 Postmenopausal bleeding: Secondary | ICD-10-CM | POA: Insufficient documentation

## 2022-06-17 DIAGNOSIS — F319 Bipolar disorder, unspecified: Secondary | ICD-10-CM | POA: Diagnosis not present

## 2022-06-17 DIAGNOSIS — R7303 Prediabetes: Secondary | ICD-10-CM

## 2022-06-17 HISTORY — PX: DILATATION & CURETTAGE/HYSTEROSCOPY WITH MYOSURE: SHX6511

## 2022-06-17 LAB — POCT PREGNANCY, URINE: Preg Test, Ur: NEGATIVE

## 2022-06-17 LAB — TYPE AND SCREEN
ABO/RH(D): O POS
Antibody Screen: NEGATIVE

## 2022-06-17 LAB — ABO/RH: ABO/RH(D): O POS

## 2022-06-17 SURGERY — DILATATION & CURETTAGE/HYSTEROSCOPY WITH MYOSURE
Anesthesia: General | Site: Vagina

## 2022-06-17 MED ORDER — IBUPROFEN 800 MG PO TABS: 800.0000 mg | ORAL_TABLET | Freq: Three times a day (TID) | ORAL | 1 refills | Status: AC | PRN

## 2022-06-17 MED ORDER — OXYCODONE HCL 5 MG/5ML PO SOLN: 5.0000 mg | Freq: Once | ORAL | Status: DC | PRN

## 2022-06-17 MED ORDER — PROMETHAZINE HCL 25 MG/ML IJ SOLN: 6.2500 mg | INTRAMUSCULAR | Status: DC | PRN

## 2022-06-17 MED ORDER — ACETAMINOPHEN 10 MG/ML IV SOLN: 1000.0000 mg | Freq: Once | INTRAVENOUS | Status: DC | PRN

## 2022-06-17 MED ORDER — LIDOCAINE HCL (CARDIAC) PF 100 MG/5ML IV SOSY
PREFILLED_SYRINGE | INTRAVENOUS | Status: DC | PRN
  Administered 2022-06-17: 80 mg via INTRAVENOUS

## 2022-06-17 MED ORDER — GABAPENTIN 300 MG PO CAPS
ORAL_CAPSULE | ORAL | Status: AC
  Administered 2022-06-17: 300 mg via ORAL
  Filled 2022-06-17: qty 1

## 2022-06-17 MED ORDER — PROPOFOL 10 MG/ML IV BOLUS
INTRAVENOUS | Status: DC | PRN
  Administered 2022-06-17: 160 mg via INTRAVENOUS
  Administered 2022-06-17: 60 mg via INTRAVENOUS
  Administered 2022-06-17: 40 mg via INTRAVENOUS

## 2022-06-17 MED ORDER — FAMOTIDINE 20 MG PO TABS
ORAL_TABLET | ORAL | Status: AC
  Administered 2022-06-17: 20 mg via ORAL
  Filled 2022-06-17: qty 1

## 2022-06-17 MED ORDER — ONDANSETRON HCL 4 MG/2ML IJ SOLN
INTRAMUSCULAR | Status: DC | PRN
  Administered 2022-06-17: 4 mg via INTRAVENOUS

## 2022-06-17 MED ORDER — ACETAMINOPHEN 500 MG PO TABS
ORAL_TABLET | ORAL | Status: AC
  Administered 2022-06-17: 1000 mg via ORAL
  Filled 2022-06-17: qty 2

## 2022-06-17 MED ORDER — FENTANYL CITRATE (PF) 100 MCG/2ML IJ SOLN
INTRAMUSCULAR | Status: AC
  Filled 2022-06-17: qty 2

## 2022-06-17 MED ORDER — SILVER NITRATE-POT NITRATE 75-25 % EX MISC
CUTANEOUS | Status: DC | PRN
  Administered 2022-06-17: 1

## 2022-06-17 MED ORDER — CHLORHEXIDINE GLUCONATE 0.12 % MT SOLN
OROMUCOSAL | Status: AC
  Administered 2022-06-17: 15 mL via OROMUCOSAL
  Filled 2022-06-17: qty 15

## 2022-06-17 MED ORDER — DEXAMETHASONE SODIUM PHOSPHATE 10 MG/ML IJ SOLN
INTRAMUSCULAR | Status: DC | PRN
  Administered 2022-06-17: 10 mg via INTRAVENOUS

## 2022-06-17 MED ORDER — ORAL CARE MOUTH RINSE: 15.0000 mL | Freq: Once | OROMUCOSAL | Status: AC

## 2022-06-17 MED ORDER — GABAPENTIN 300 MG PO CAPS: 300.0000 mg | ORAL_CAPSULE | ORAL | Status: AC

## 2022-06-17 MED ORDER — ACETAMINOPHEN 500 MG PO TABS: 1000.0000 mg | ORAL_TABLET | ORAL | Status: AC

## 2022-06-17 MED ORDER — PHENYLEPHRINE 80 MCG/ML (10ML) SYRINGE FOR IV PUSH (FOR BLOOD PRESSURE SUPPORT)
PREFILLED_SYRINGE | INTRAVENOUS | Status: DC | PRN
  Administered 2022-06-17: 100 ug via INTRAVENOUS

## 2022-06-17 MED ORDER — CEFAZOLIN SODIUM-DEXTROSE 2-4 GM/100ML-% IV SOLN
INTRAVENOUS | Status: AC
  Filled 2022-06-17: qty 100

## 2022-06-17 MED ORDER — OXYCODONE HCL 5 MG PO TABS: 5.0000 mg | ORAL_TABLET | Freq: Once | ORAL | Status: DC | PRN

## 2022-06-17 MED ORDER — CHLORHEXIDINE GLUCONATE 0.12 % MT SOLN: 15.0000 mL | Freq: Once | OROMUCOSAL | Status: AC

## 2022-06-17 MED ORDER — LACTATED RINGERS IV SOLN: INTRAVENOUS | Status: DC

## 2022-06-17 MED ORDER — DROPERIDOL 2.5 MG/ML IJ SOLN: 0.6250 mg | Freq: Once | INTRAMUSCULAR | Status: DC | PRN

## 2022-06-17 MED ORDER — FENTANYL CITRATE (PF) 100 MCG/2ML IJ SOLN: 25.0000 ug | INTRAMUSCULAR | Status: DC | PRN

## 2022-06-17 MED ORDER — FENTANYL CITRATE (PF) 100 MCG/2ML IJ SOLN
INTRAMUSCULAR | Status: DC | PRN
  Administered 2022-06-17 (×2): 50 ug via INTRAVENOUS

## 2022-06-17 MED ORDER — EPHEDRINE SULFATE (PRESSORS) 50 MG/ML IJ SOLN
INTRAMUSCULAR | Status: DC | PRN
  Administered 2022-06-17 (×2): 10 mg via INTRAVENOUS

## 2022-06-17 MED ORDER — POVIDONE-IODINE 10 % EX SWAB
2.0000 | Freq: Once | CUTANEOUS | Status: AC
  Administered 2022-06-17: 2 via TOPICAL

## 2022-06-17 MED ORDER — FAMOTIDINE 20 MG PO TABS: 20.0000 mg | ORAL_TABLET | Freq: Once | ORAL | Status: AC

## 2022-06-17 SURGICAL SUPPLY — 20 items
CANISTER SUC SOCK COL 7IN (MISCELLANEOUS) IMPLANT
DEVICE MYOSURE LITE (MISCELLANEOUS) IMPLANT
DRSG TELFA 3X8 NADH STRL (GAUZE/BANDAGES/DRESSINGS) IMPLANT
GLOVE BIO SURGEON STRL SZ7 (GLOVE) ×3 IMPLANT
GLOVE SURG UNDER LTX SZ7.5 (GLOVE) ×3 IMPLANT
GOWN STRL REUS W/ TWL LRG LVL3 (GOWN DISPOSABLE) ×6 IMPLANT
GOWN STRL REUS W/TWL LRG LVL3 (GOWN DISPOSABLE) ×2
IV NS IRRIG 3000ML ARTHROMATIC (IV SOLUTION) ×3 IMPLANT
KIT PROCEDURE FLUENT (KITS) ×3 IMPLANT
KIT TURNOVER CYSTO (KITS) ×3 IMPLANT
PACK DNC HYST (MISCELLANEOUS) ×3 IMPLANT
PAD OB MATERNITY 4.3X12.25 (PERSONAL CARE ITEMS) ×3 IMPLANT
PAD PREP 24X41 OB/GYN DISP (PERSONAL CARE ITEMS) ×3 IMPLANT
SCRUB CHG 4% DYNA-HEX 4OZ (MISCELLANEOUS) ×3 IMPLANT
SEAL ROD LENS SCOPE MYOSURE (ABLATOR) ×3 IMPLANT
SET CYSTO W/LG BORE CLAMP LF (SET/KITS/TRAYS/PACK) IMPLANT
SYR 10ML LL (SYRINGE) ×3 IMPLANT
TRAP FLUID SMOKE EVACUATOR (MISCELLANEOUS) ×3 IMPLANT
TUBING CONNECTING 10 (TUBING) IMPLANT
WATER STERILE IRR 500ML POUR (IV SOLUTION) ×3 IMPLANT

## 2022-06-17 NOTE — Anesthesia Procedure Notes (Signed)
Procedure Name: LMA Insertion Date/Time: 06/17/2022 12:24 PM  Performed by: Mylo Driskill, CRNAPre-anesthesia Checklist: Patient identified, Patient being monitored, Timeout performed, Emergency Drugs available and Suction available Patient Re-evaluated:Patient Re-evaluated prior to induction Oxygen Delivery Method: Circle system utilized Preoxygenation: Pre-oxygenation with 100% oxygen Induction Type: IV induction Ventilation: Mask ventilation without difficulty LMA: LMA inserted LMA Size: 5.0 Tube type: Oral Number of attempts: 1 Placement Confirmation: positive ETCO2 and breath sounds checked- equal and bilateral Tube secured with: Tape Dental Injury: Teeth and Oropharynx as per pre-operative assessment

## 2022-06-17 NOTE — Anesthesia Postprocedure Evaluation (Signed)
Anesthesia Post Note  Patient: Mary Henson  Procedure(s) Performed: DILATATION & CURETTAGE/HYSTEROSCOPY WITH POSSIBLE POLYPECTOMY (Vagina )  Patient location during evaluation: PACU Anesthesia Type: General Level of consciousness: awake and alert Pain management: pain level controlled Vital Signs Assessment: post-procedure vital signs reviewed and stable Respiratory status: spontaneous breathing, nonlabored ventilation and respiratory function stable Cardiovascular status: blood pressure returned to baseline and stable Postop Assessment: no apparent nausea or vomiting Anesthetic complications: no   No notable events documented.   Last Vitals:  Vitals:   06/17/22 1315 06/17/22 1330  BP: (!) 95/57 114/65  Pulse: 77 74  Resp: (!) 9 11  Temp:    SpO2: 100% 100%    Last Pain:  Vitals:   06/17/22 1121  TempSrc: Temporal  PainSc: 0-No pain                 Iran Ouch

## 2022-06-17 NOTE — Op Note (Signed)
Operative Report Hysteroscopy with Dilation and Curettage   Indications: Postmenopausal bleeding   Pre-operative Diagnosis: Endometrial polyp, fibroid uterus   Post-operative Diagnosis: same.  Procedure: 1. Exam under anesthesia 2. Fractional D&C 3. Hysteroscopy 3. Endocervical polypectomy  Surgeon: Benjaman Kindler, MD  Assistant(s):  None  Anesthesia: General LMA anesthesia  Anesthesiologist: Iran Ouch, MD Anesthesiologist: Iran Ouch, MD CRNA: Johney Maine, CRNA  Estimated Blood Loss:  Minimal         Intraoperative medications:  toradol         Total IV Fluids: 588m  Urine Output: 530m        Specimens: Endocervical curettings, endometrial curettings, endocervical polyp/exocervical polyp vs nabothian cyst         Complications:  None; patient tolerated the procedure well.         Disposition: PACU - hemodynamically stable.         Condition: stable  Findings: Uterus measuring 9 cm by sound; normal cervix, vagina, perineum. Diffusely atrophic endometrial tissue except posteriorly with proliferative tissue noted, with small submucosal fibroids making very small hills, and a Nabothian cyst at the ectocervix with a possible endocervical polyp  Indication for procedure/Consents: [address]8110.o. F  here for scheduled surgery for the aforementioned diagnoses.     Risks of surgery were discussed with the patient including but not limited to: bleeding which may require transfusion; infection which may require antibiotics; injury to uterus or surrounding organs; intrauterine scarring which may impair future fertility; need for additional procedures including laparotomy or laparoscopy; and other postoperative/anesthesia complications. Written informed consent was obtained.    Procedure Details:  D&C/Polypectomy The patient was taken to the operating room where anesthesia was administered and was found to be adequate.  After a formal and adequate  timeout was performed, she was placed in the dorsal lithotomy position and examined with the above findings. She was then prepped and draped in the sterile manner.   Her bladder was catheterized for an estimated amount of clear, yellow urine. A weighed speculum was then placed in the patient's vagina and a single tooth tenaculum was applied to the anterior lip of the cervix.  Her cervix was serially dilated to 15 FrPakistansing Hanks dilators. An ECC was performed. Her uterus was sounded to 9 cm. The hysteroscope was introduced to reveal the above findings. Endocervical polypectomy was performed with polypectomy forceps.   A sharp curettage was then performed until there was a gritty texture in all four quadrants.  The tenaculum was removed from the anterior lip of the cervix and the vaginal speculum was removed after applying silver nitrate for good hemostasis.   The patient tolerated the procedure well and was taken to the recovery area awake and in stable condition. She received iv acetaminophen and Toradol prior to leaving the OR.  The patient will be discharged to home as per PACU criteria. Routine postoperative instructions given.  She was prescribed Ibuprofen and Colace.  She will follow up in the clinic in two weeks for postoperative evaluation.

## 2022-06-17 NOTE — Interval H&P Note (Signed)
History and Physical Interval Note:  06/17/2022 11:34 AM  Mary Henson  has presented today for surgery, with the diagnosis of post meopausal bleeding, endometrial polyp, endometrial fibroid.  The various methods of treatment have been discussed with the patient and family. After consideration of risks, benefits and other options for treatment, the patient has consented to  Procedure(s): DILATATION & CURETTAGE/HYSTEROSCOPY WITH MYOSURE MYOMECTOMY, POSSIBLE POLYPECTOMY (N/A) as a surgical intervention.  The patient's history has been reviewed, patient examined, no change in status, stable for surgery.  I have reviewed the patient's chart and labs.  Questions were answered to the patient's satisfaction.     Benjaman Kindler

## 2022-06-17 NOTE — Anesthesia Preprocedure Evaluation (Addendum)
Anesthesia Evaluation  Patient identified by MRN, date of birth, ID band Patient awake    Reviewed: Allergy & Precautions, NPO status , Patient's Chart, lab work & pertinent test results  Airway Mallampati: III  TM Distance: >3 FB Neck ROM: full    Dental no notable dental hx.    Pulmonary sleep apnea ,    Pulmonary exam normal        Cardiovascular hypertension, Normal cardiovascular exam     Neuro/Psych PSYCHIATRIC DISORDERS Bipolar Disorder negative neurological ROS     GI/Hepatic negative GI ROS, Neg liver ROS,   Endo/Other  negative endocrine ROS  Renal/GU      Musculoskeletal   Abdominal (+) + obese,   Peds  Hematology negative hematology ROS (+)   Anesthesia Other Findings Past Medical History: No date: Anemia No date: Bipolar disorder (HCC) No date: Hypertension No date: Sleep apnea  Past Surgical History: No date: MYOMECTOMY No date: UTERINE FIBROID EMBOLIZATION     Reproductive/Obstetrics negative OB ROS                           Anesthesia Physical Anesthesia Plan  ASA: 2  Anesthesia Plan: General LMA   Post-op Pain Management: Tylenol PO (pre-op)*, Gabapentin PO (pre-op)* and Toradol IV (intra-op)*   Induction: Intravenous  PONV Risk Score and Plan: 4 or greater and Dexamethasone, Ondansetron, Midazolam and Treatment may vary due to age or medical condition  Airway Management Planned: LMA  Additional Equipment:   Intra-op Plan:   Post-operative Plan: Extubation in OR  Informed Consent: I have reviewed the patients History and Physical, chart, labs and discussed the procedure including the risks, benefits and alternatives for the proposed anesthesia with the patient or authorized representative who has indicated his/her understanding and acceptance.     Dental Advisory Given  Plan Discussed with: Anesthesiologist, CRNA and Surgeon  Anesthesia Plan  Comments:        Anesthesia Quick Evaluation

## 2022-06-17 NOTE — Discharge Instructions (Addendum)
Discharge instructions after a hysteroscopy with dilation and curettage  Signs and Symptoms to Report  Call our office at [phone] if you have any of the following:    Fever over 100.4 degrees or higher  Severe stomach pain not relieved with pain medications  Bright red bleeding that's heavier than a period that does not slow with rest after the first 24 hours  To go the bathroom a lot (frequency), you can't hold your urine (urgency), or it hurts when you empty your bladder (urinate)  Chest pain  Shortness of breath  Pain in the calves of your legs  Severe nausea and vomiting not relieved with anti-nausea medications  Any concerns  What You Can Expect after Surgery  You may see some pink tinged, bloody fluid. This is normal. You may also have cramping for several days.   Activities after Your Discharge Follow these guidelines to help speed your recovery at home:  Don't drive if you are in pain or taking narcotic pain medicine. You may drive when you can safely slam on the brakes, turn the wheel forcefully, and rotate your torso comfortably. This is typically 4-7 days. Practice in a parking lot or side street prior to attempting to drive regularly.   Ask others to help with household chores for 4 weeks.  Don't do strenuous activities, exercises, or sports like vacuuming, tennis, squash, etc. until your doctor says it is safe to do so.  Walk as you feel able. Rest often since it may take a week or two for your energy level to return to normal.   You may climb stairs  Avoid constipation:   -Eat fruits, vegetables, and whole grains. Eat small meals as your appetite will take time to return to normal.   -Drink 6 to 8 glasses of water each day unless your doctor has told you to limit your fluids.   -Use a laxative or stool softener as needed if constipation becomes a problem. You may take Miralax, metamucil, Citrucil, Colace, Senekot, FiberCon, etc. If this does not relieve the  constipation, try two tablespoons of Milk Of Magnesia every 8 hours until your bowels move.   You may shower.   Do not get in a hot tub, swimming pool, etc. until your doctor agrees.  Do not douche, use tampons, or have sex until your doctor says it is okay, usually about 2 weeks.  Take your pain medicine when you need it. The medicine may not work as well if the pain is bad.  Take the medicines you were taking before surgery. Other medications you might need are pain medications (ibuprofen), medications for constipation (Colace) and nausea medications (Zofran).      AMBULATORY SURGERY  DISCHARGE INSTRUCTIONS   The drugs that you were given will stay in your system until tomorrow so for the next 24 hours you should not:  Drive an automobile Make any legal decisions Drink any alcoholic beverage   You may resume regular meals tomorrow.  Today it is better to start with liquids and gradually work up to solid foods.  You may eat anything you prefer, but it is better to start with liquids, then soup and crackers, and gradually work up to solid foods.   Please notify your doctor immediately if you have any unusual bleeding, trouble breathing, redness and pain at the surgery site, drainage, fever, or pain not relieved by medication.    Additional Instructions:        Please contact your physician   with any problems or Same Day Surgery at [phone], Monday through Friday 6 am to 4 pm, or [facility] [facility] number at [phone]. 

## 2022-06-17 NOTE — Transfer of Care (Signed)
Immediate Anesthesia Transfer of Care Note  Patient: Mary Henson  Procedure(s) Performed: DILATATION & CURETTAGE/HYSTEROSCOPY WITH POSSIBLE POLYPECTOMY (Vagina )  Patient Location: PACU  Anesthesia Type:General  Level of Consciousness: awake and alert   Airway & Oxygen Therapy: Patient Spontanous Breathing and Patient connected to nasal cannula oxygen  Post-op Assessment: Report given to RN and Post -op Vital signs reviewed and stable  Post vital signs: Reviewed and stable  Last Vitals:  Vitals Value Taken Time  BP 105/60 06/17/22 1309  Temp 36.4 C 06/17/22 1309  Pulse 77 06/17/22 1313  Resp 10 06/17/22 1313  SpO2 100 % 06/17/22 1313  Vitals shown include unvalidated device data.  Last Pain:  Vitals:   06/17/22 1121  TempSrc: Temporal  PainSc: 0-No pain         Complications: No notable events documented.

## 2022-06-18 ENCOUNTER — Encounter: Payer: Self-pay | Admitting: Obstetrics and Gynecology

## 2022-06-20 LAB — SURGICAL PATHOLOGY

## 2022-12-05 ENCOUNTER — Ambulatory Visit: Payer: Medicare (Managed Care)

## 2022-12-05 DIAGNOSIS — K635 Polyp of colon: Secondary | ICD-10-CM | POA: Diagnosis present

## 2022-12-05 DIAGNOSIS — Z800 Family history of malignant neoplasm of digestive organs: Secondary | ICD-10-CM | POA: Diagnosis not present

## 2022-12-05 DIAGNOSIS — R197 Diarrhea, unspecified: Secondary | ICD-10-CM | POA: Diagnosis not present

## 2023-04-19 ENCOUNTER — Emergency Department: Payer: Medicare (Managed Care)

## 2023-04-19 ENCOUNTER — Emergency Department
Admission: EM | Admit: 2023-04-19 | Discharge: 2023-04-19 | Disposition: A | Discharge status: Home / Self Care | Payer: Medicare (Managed Care) | Attending: Emergency Medicine | Admitting: Emergency Medicine

## 2023-04-19 ENCOUNTER — Other Ambulatory Visit: Payer: Self-pay

## 2023-04-19 DIAGNOSIS — I10 Essential (primary) hypertension: Secondary | ICD-10-CM | POA: Insufficient documentation

## 2023-04-19 DIAGNOSIS — R0602 Shortness of breath: Secondary | ICD-10-CM | POA: Diagnosis not present

## 2023-04-19 DIAGNOSIS — R079 Chest pain, unspecified: Secondary | ICD-10-CM | POA: Insufficient documentation

## 2023-04-19 LAB — BASIC METABOLIC PANEL
Anion gap: 3 — ABNORMAL LOW (ref 5–15)
BUN: 11 mg/dL (ref 6–20)
CO2: 26 mmol/L (ref 22–32)
Calcium: 8.8 mg/dL — ABNORMAL LOW (ref 8.9–10.3)
Chloride: 109 mmol/L (ref 98–111)
Creatinine, Ser: 0.86 mg/dL (ref 0.44–1.00)
GFR, Estimated: >60 mL/min (ref >60)
Glucose, Bld: 95 mg/dL (ref 70–99)
Potassium: 4 mmol/L (ref 3.5–5.1)
Sodium: 138 mmol/L (ref 135–145)

## 2023-04-19 LAB — CBC
HCT: 38.7 % (ref 36.0–46.0)
Hemoglobin: 12.7 g/dL (ref 12.0–15.0)
MCH: 32.2 pg (ref 26.0–34.0)
MCHC: 32.8 g/dL (ref 30.0–36.0)
MCV: 98.2 fL (ref 80.0–100.0)
Platelets: 135 10*3/uL — ABNORMAL LOW (ref 150–400)
RBC: 3.94 MIL/uL (ref 3.87–5.11)
RDW: 12.6 % (ref 11.5–15.5)
WBC: 5.7 10*3/uL (ref 4.0–10.5)
nRBC: 0 % (ref 0.0–0.2)

## 2023-04-19 LAB — TROPONIN I (HIGH SENSITIVITY)
Troponin I (High Sensitivity): 3 ng/L (ref <18)
Troponin I (High Sensitivity): 3 ng/L (ref <18)

## 2023-04-19 LAB — BRAIN NATRIURETIC PEPTIDE: B Natriuretic Peptide: 62.3 pg/mL (ref 0.0–100.0)

## 2023-04-19 MED ORDER — NITROGLYCERIN 0.4 MG SL SUBL
0.4000 mg | SUBLINGUAL_TABLET | Freq: Once | SUBLINGUAL | Status: AC
  Administered 2023-04-19: 0.4 mg via SUBLINGUAL
  Filled 2023-04-19: qty 1

## 2023-04-19 NOTE — ED Triage Notes (Signed)
Pt to ED via POV, pt states that she has been having intermittent chest over the last few days, this morning the pain has become more consistent and she is also having left arm pain. Pt states that she did have dizziness 2 days ago but she has not had any today. Pt did recently started a new blood pressure medication. Pts dose was increased to 40 mg but she has since went back down to 20 mg due to the dizziness.   Pt states that she is actively having chest pain. Pt denies cardiac history. Pt states that she does have family hx/o cardiac disease. Pt is in NAD.

## 2023-04-19 NOTE — ED Provider Notes (Signed)
[facility] Provider Note    Event Date/Time   First MD Initiated Contact with Patient 04/19/23 1037     (approximate)   History   Chest Pain   HPI Mary Henson is a 57 y.o. female with HTN presenting today for chest pain.  Patient states over the past several months she has had on and off left-sided chest pain symptoms.  She has seen her cardiologist approximately 1 week ago who felt reassured overall by the rest of her exam and story.  This morning she noted some left chest tingling that went into her left arm as well as somewhat into her left neck.  She only notices in her chest at this time.  She denies [place] specifically a pain or pressure and more of tingling sensation.  Denies any weakness or numbness anywhere.  Denies fever, chills, cough, shortness of breath, nausea, diaphoresis, abdominal pain.  No prior smoking history.  No prior history of MI.  1 family member with history of CHF.     Physical Exam   Triage Vital Signs: ED Triage Vitals  Encounter Vitals Group     BP 04/19/23 1020 (!) 160/92     Systolic BP Percentile --      Diastolic BP Percentile --      Pulse Rate 04/19/23 1020 77     Resp 04/19/23 1020 16     Temp 04/19/23 1020 (!) 97.4 F (36.3 C)     Temp Source 04/19/23 1020 Oral     SpO2 04/19/23 1020 97 %     Weight 04/19/23 1017 194 lb (88 kg)     Height 04/19/23 1017 5\' 4"  (1.626 m)     Head Circumference --      Peak Flow --      Pain Score 04/19/23 1017 4     Pain Loc --      Pain Education --      Exclude from Growth Chart --     Most recent vital signs: Vitals:   04/19/23 1020 04/19/23 1130  BP: (!) 160/92 (!) 146/82  Pulse: 77 74  Resp: 16 17  Temp: (!) 97.4 F (36.3 C)   SpO2: 97% 99%   Physical Exam: I have reviewed the vital signs and nursing notes. General: Awake, alert, no acute distress.  Nontoxic appearing. Head:  Atraumatic, normocephalic.   ENT:  EOM intact, PERRL. Oral mucosa is pink and moist  with no lesions. Neck: Neck is supple with full range of motion, No meningeal signs. Cardiovascular:  RRR, No murmurs. Peripheral pulses palpable and equal bilaterally. Respiratory:  Symmetrical chest wall expansion.  No rhonchi, rales, or wheezes.  Good air movement throughout.  No use of accessory muscles.   Musculoskeletal:  No cyanosis or edema. Moving extremities with full ROM Abdomen:  Soft, nontender, nondistended. Neuro:  GCS 15, moving all four extremities, interacting appropriately. Speech clear. Psych:  Calm, appropriate.   Skin:  Warm, dry, no rash.    ED Results / Procedures / Treatments   Labs (all labs ordered are listed, but only abnormal results are displayed) Labs Reviewed  BASIC METABOLIC PANEL - Abnormal; Notable for the following components:      Result Value   Calcium 8.8 (*)    Anion gap 3 (*)    All other components within normal limits  CBC - Abnormal; Notable for the following components:   Platelets 135 (*)    All other components within normal limits  BRAIN NATRIURETIC PEPTIDE  TROPONIN I (HIGH SENSITIVITY)  TROPONIN I (HIGH SENSITIVITY)     EKG My EKG interpretation: Rate of 74, normal sinus rhythm, normal axis, normal intervals.  No acute ST elevations or depressions.  T wave inversion present in lead V2 and equivocally V3.  Overall comparable with EKG from 04/21/2022.   RADIOLOGY See ED course for my independent interpretation   PROCEDURES:  Critical Care performed: No  Procedures   MEDICATIONS ORDERED IN ED: Medications  nitroGLYCERIN (NITROSTAT) SL tablet 0.4 mg (0.4 mg Sublingual Given 04/19/23 1103)     IMPRESSION / MDM / ASSESSMENT AND PLAN / ED COURSE  I reviewed the triage vital signs and the nursing notes.                              Differential diagnosis includes, but is not limited to, ACS, noncardiac chest pain, pneumonia, pneumothorax, musculoskeletal pain.  Patient's presentation is most consistent with acute  complicated illness / injury requiring diagnostic workup.  Patient is a 57 year old female presenting today for chest pain without other associated symptoms.  EKG reassuring and consistent with her baseline.  Troponins of 3 and 3.  Heart score of 2 and low suspicion for ACS at this time.  Chest x-Hollar and other laboratory workup otherwise unremarkable.  Patient stable at this time with no suspicion for ACS and follow-up outpatient with her cardiologist.  Vital signs stable and patient given strict return precautions.  The patient is on the cardiac monitor to evaluate for evidence of arrhythmia and/or significant heart rate changes. Clinical Course as of 04/19/23 1301  Wed Apr 19, 2023  1059 DG Chest 2 View No acute pathology per my interpretation [DW]  1059 CBC(!) Unremarkable [DW]  1102 Troponin I (High Sensitivity): 3 [DW]  1103 Heart score of 2. [DW]  1251 Troponin I (High Sensitivity): 3 [DW]  1251 Will discharge at this time [DW]    Clinical Course User Index [DW] Janith Lima, MD     FINAL CLINICAL IMPRESSION(S) / ED DIAGNOSES   Final diagnoses:  Chest pain, unspecified type     Rx / DC Orders   ED Discharge Orders          Ordered    Ambulatory referral to Cardiology       Comments: If you have not heard from the Cardiology office within the next 72 hours please call [phone].   04/19/23 1300             Note:  This document was prepared using Dragon voice recognition software and may include unintentional dictation errors.   Janith Lima, MD 04/19/23 1302

## 2023-04-19 NOTE — ED Notes (Signed)
Pt asking for something to eat, Dr. Anner Crete gave the ok for pt to eat. Pt given option of [place] sandwich or placing order with cafeteria. Pt asking for cafeteria, order placed.

## 2023-04-19 NOTE — Discharge Instructions (Signed)
You are seen in the emergency department today for your chest pain symptoms.  No evidence of any damage to your heart at this time.  The rest of her workup is reassuring.  Please follow-up with your cardiologist as needed for ongoing management.

## 2023-04-19 NOTE — ED Notes (Signed)
Pt asking for breakfast/lunch menu. Pt informed would have to wait for all results to come back then we can speak to the Dr and get some food. Pt agreeable to this plan.

## 2023-04-19 NOTE — ED Notes (Signed)
Pt taken to XRAY

## 2023-05-06 ENCOUNTER — Emergency Department: Payer: Medicare (Managed Care)

## 2023-05-06 ENCOUNTER — Emergency Department
Admission: EM | Admit: 2023-05-06 | Discharge: 2023-05-06 | Disposition: A | Discharge status: Home / Self Care | Payer: Medicare (Managed Care) | Attending: Student in an Organized Health Care Education/Training Program | Admitting: Student in an Organized Health Care Education/Training Program

## 2023-05-06 DIAGNOSIS — U071 COVID-19: Secondary | ICD-10-CM | POA: Insufficient documentation

## 2023-05-06 DIAGNOSIS — R051 Acute cough: Secondary | ICD-10-CM

## 2023-05-06 DIAGNOSIS — I10 Essential (primary) hypertension: Secondary | ICD-10-CM | POA: Diagnosis not present

## 2023-05-06 DIAGNOSIS — R059 Cough, unspecified: Secondary | ICD-10-CM | POA: Diagnosis present

## 2023-05-06 LAB — CBC WITH DIFFERENTIAL/PLATELET
Abs Immature Granulocytes: 0.05 10*3/uL (ref 0.00–0.07)
Basophils Absolute: 0 10*3/uL (ref 0.0–0.1)
Basophils Relative: 1 %
Eosinophils Absolute: 0.2 10*3/uL (ref 0.0–0.5)
Eosinophils Relative: 3 %
HCT: 39.6 % (ref 36.0–46.0)
Hemoglobin: 13.1 g/dL (ref 12.0–15.0)
Immature Granulocytes: 1 %
Lymphocytes Relative: 38 %
Lymphs Abs: 2.4 10*3/uL (ref 0.7–4.0)
MCH: 31.7 pg (ref 26.0–34.0)
MCHC: 33.1 g/dL (ref 30.0–36.0)
MCV: 95.9 fL (ref 80.0–100.0)
Monocytes Absolute: 0.9 10*3/uL (ref 0.1–1.0)
Monocytes Relative: 14 %
Neutro Abs: 2.7 10*3/uL (ref 1.7–7.7)
Neutrophils Relative %: 43 %
Platelets: 151 10*3/uL (ref 150–400)
RBC: 4.13 MIL/uL (ref 3.87–5.11)
RDW: 12.3 % (ref 11.5–15.5)
WBC: 6.2 10*3/uL (ref 4.0–10.5)
nRBC: 0 % (ref 0.0–0.2)

## 2023-05-06 LAB — BASIC METABOLIC PANEL
Anion gap: 10 (ref 5–15)
BUN: 11 mg/dL (ref 6–20)
CO2: 27 mmol/L (ref 22–32)
Calcium: 8.7 mg/dL — ABNORMAL LOW (ref 8.9–10.3)
Chloride: 101 mmol/L (ref 98–111)
Creatinine, Ser: 0.95 mg/dL (ref 0.44–1.00)
GFR, Estimated: >60 mL/min (ref >60)
Glucose, Bld: 81 mg/dL (ref 70–99)
Potassium: 4.2 mmol/L (ref 3.5–5.1)
Sodium: 138 mmol/L (ref 135–145)

## 2023-05-06 LAB — D-DIMER, QUANTITATIVE: D-Dimer, Quant: 2.19 ug{FEU}/mL — ABNORMAL HIGH (ref 0.00–0.50)

## 2023-05-06 MED ORDER — IPRATROPIUM-ALBUTEROL 0.5-2.5 (3) MG/3ML IN SOLN
3.0000 mL | Freq: Once | RESPIRATORY_TRACT | Status: AC
  Administered 2023-05-06: 3 mL via RESPIRATORY_TRACT
  Filled 2023-05-06: qty 3

## 2023-05-06 MED ORDER — ALBUTEROL SULFATE HFA 108 (90 BASE) MCG/ACT IN AERS: 2.0000 | INHALATION_SPRAY | Freq: Four times a day (QID) | RESPIRATORY_TRACT | 2 refills | Status: AC | PRN

## 2023-05-06 MED ORDER — IOHEXOL 350 MG/ML SOLN
75.0000 mL | Freq: Once | INTRAVENOUS | Status: AC | PRN
  Administered 2023-05-06: 75 mL via INTRAVENOUS

## 2023-05-06 NOTE — ED Triage Notes (Signed)
Pt arrives via [facility] from home for small amount of hemoptysis since Tuesday. Pt took home COVID test on Monday which was positive. C/o ongoing cough, congestion, malaise. Denies fever, N/V.

## 2023-05-06 NOTE — ED Provider Notes (Signed)
[facility] Provider Note    Event Date/Time   First MD Initiated Contact with Patient 05/06/23 1159     (approximate)   History   Hemoptysis and Cough   HPI  Mary Henson is a 57 y.o. female   with a history of hypertension as well as bronchitis presents to the ER for evaluation of diagnosis of COVID this week now with progressive chest discomfort shortness of breath and hemoptysis.  No history of DVT or PE.      Physical Exam   Triage Vital Signs: ED Triage Vitals  Encounter Vitals Group     BP 05/06/23 1113 (!) 175/94     Systolic BP Percentile --      Diastolic BP Percentile --      Pulse Rate 05/06/23 1113 77     Resp 05/06/23 1113 16     Temp 05/06/23 1113 98.4 F (36.9 C)     Temp Source 05/06/23 1113 Oral     SpO2 05/06/23 1113 96 %     Weight --      Height --      Head Circumference --      Peak Flow --      Pain Score 05/06/23 1114 0     Pain Loc --      Pain Education --      Exclude from Growth Chart --     Most recent vital signs: Vitals:   05/06/23 1113  BP: (!) 175/94  Pulse: 77  Resp: 16  Temp: 98.4 F (36.9 C)  SpO2: 96%     Constitutional: Alert  Eyes: Conjunctivae are normal.  Head: Atraumatic. Nose: No congestion/rhinnorhea. Mouth/Throat: Mucous membranes are moist.   Neck: Painless ROM.  Cardiovascular:   Good peripheral circulation. Respiratory: Normal respiratory effort.  No retractions.  Coarse expiratory breath sounds.  No significant wheezing noted. Gastrointestinal: Soft and nontender.  Musculoskeletal:  no deformity Neurologic:  MAE spontaneously. No gross focal neurologic deficits are appreciated.  Skin:  Skin is warm, dry and intact. No rash noted. Psychiatric: Mood and affect are normal. Speech and behavior are normal.    ED Results / Procedures / Treatments   Labs (all labs ordered are listed, but only abnormal results are displayed) Labs Reviewed  BASIC METABOLIC PANEL - Abnormal;  Notable for the following components:      Result Value   Calcium 8.7 (*)    All other components within normal limits  D-DIMER, QUANTITATIVE - Abnormal; Notable for the following components:   D-Dimer, Quant 2.19 (*)    All other components within normal limits  CBC WITH DIFFERENTIAL/PLATELET     EKG     RADIOLOGY Please see ED Course for my review and interpretation.  I personally reviewed all radiographic images ordered to evaluate for the above acute complaints and reviewed radiology reports and findings.  These findings were personally discussed with the patient.  Please see medical record for radiology report.    PROCEDURES:  Critical Care performed: No  Procedures   MEDICATIONS ORDERED IN ED: Medications  ipratropium-albuterol (DUONEB) 0.5-2.5 (3) MG/3ML nebulizer solution 3 mL (3 mLs Nebulization Given 05/06/23 1257)  iohexol (OMNIPAQUE) 350 MG/ML injection 75 mL (75 mLs Intravenous Contrast Given 05/06/23 1529)     IMPRESSION / MDM / ASSESSMENT AND PLAN / ED COURSE  I reviewed the triage vital signs and the nursing notes.  Differential diagnosis includes, but is not limited to, bronchitis, COVID, pneumonia, mass, pneumothorax, PE  Patient presenting to the ER for evaluation of symptoms as described above.  Based on symptoms, risk factors and considered above differential, this presenting complaint could reflect a potentially life-threatening illness therefore the patient will be placed on continuous pulse oximetry and telemetry for monitoring.  Laboratory evaluation will be sent to evaluate for the above complaints.      Clinical Course as of 05/06/23 1602  Sat May 06, 2023  1322 Patient's D-dimer is significantly elevated will order CTA to further evaluate. [PR]  1559 Patient reassessed.  CTA my review and interpretation without evidence of PE.  No acute findings per radiology.  Patient does appear stable and appropriate for  outpatient follow-up with symptoms most likely secondary to COVID she is not hypoxic and in no acute distress tolerating p.o. [PR]    Clinical Course User Index [PR] Willy Eddy, MD     FINAL CLINICAL IMPRESSION(S) / ED DIAGNOSES   Final diagnoses:  Acute cough  COVID-19     Rx / DC Orders   ED Discharge Orders          Ordered    albuterol (VENTOLIN HFA) 108 (90 Base) MCG/ACT inhaler  Every 6 hours PRN        05/06/23 1601             Note:  This document was prepared using Dragon voice recognition software and may include unintentional dictation errors.    Willy Eddy, MD 05/06/23 [phone]

## 2023-11-30 ENCOUNTER — Encounter: Payer: Self-pay | Admitting: Family Medicine

## 2023-11-30 ENCOUNTER — Other Ambulatory Visit: Payer: Self-pay | Admitting: Family Medicine

## 2023-11-30 DIAGNOSIS — Z1231 Encounter for screening mammogram for malignant neoplasm of breast: Secondary | ICD-10-CM

## 2023-12-11 ENCOUNTER — Ambulatory Visit
Admission: RE | Admit: 2023-12-11 | Discharge: 2023-12-11 | Disposition: A | Discharge status: Home / Self Care | Payer: Medicare (Managed Care) | Source: Ambulatory Visit | Attending: Family Medicine | Admitting: Family Medicine

## 2023-12-11 DIAGNOSIS — Z1231 Encounter for screening mammogram for malignant neoplasm of breast: Secondary | ICD-10-CM | POA: Diagnosis present

## 2023-12-25 ENCOUNTER — Other Ambulatory Visit: Payer: Self-pay

## 2023-12-25 ENCOUNTER — Emergency Department: Payer: Medicare (Managed Care)

## 2023-12-25 ENCOUNTER — Encounter: Payer: Self-pay | Admitting: Emergency Medicine

## 2023-12-25 ENCOUNTER — Emergency Department
Admission: EM | Admit: 2023-12-25 | Discharge: 2023-12-25 | Disposition: A | Discharge status: Home / Self Care | Payer: Medicare (Managed Care)

## 2023-12-25 DIAGNOSIS — R519 Headache, unspecified: Secondary | ICD-10-CM

## 2023-12-25 DIAGNOSIS — I10 Essential (primary) hypertension: Secondary | ICD-10-CM | POA: Insufficient documentation

## 2023-12-25 LAB — CBC WITH DIFFERENTIAL/PLATELET
Abs Immature Granulocytes: 0.02 10*3/uL (ref 0.00–0.07)
Basophils Absolute: 0 10*3/uL (ref 0.0–0.1)
Basophils Relative: 1 %
Eosinophils Absolute: 0.1 10*3/uL (ref 0.0–0.5)
Eosinophils Relative: 2 %
HCT: 37 % (ref 36.0–46.0)
Hemoglobin: 12.2 g/dL (ref 12.0–15.0)
Immature Granulocytes: 0 %
Lymphocytes Relative: 43 %
Lymphs Abs: 2.9 10*3/uL (ref 0.7–4.0)
MCH: 32.9 pg (ref 26.0–34.0)
MCHC: 33 g/dL (ref 30.0–36.0)
MCV: 99.7 fL (ref 80.0–100.0)
Monocytes Absolute: 1.1 10*3/uL — ABNORMAL HIGH (ref 0.1–1.0)
Monocytes Relative: 15 %
Neutro Abs: 2.7 10*3/uL (ref 1.7–7.7)
Neutrophils Relative %: 39 %
Platelets: 138 10*3/uL — ABNORMAL LOW (ref 150–400)
RBC: 3.71 MIL/uL — ABNORMAL LOW (ref 3.87–5.11)
RDW: 13.5 % (ref 11.5–15.5)
WBC: 6.9 10*3/uL (ref 4.0–10.5)
nRBC: 0 % (ref 0.0–0.2)

## 2023-12-25 LAB — BASIC METABOLIC PANEL WITH GFR
Anion gap: 9 (ref 5–15)
BUN: 19 mg/dL (ref 6–20)
CO2: 28 mmol/L (ref 22–32)
Calcium: 8.9 mg/dL (ref 8.9–10.3)
Chloride: 100 mmol/L (ref 98–111)
Creatinine, Ser: 1.02 mg/dL — ABNORMAL HIGH (ref 0.44–1.00)
GFR, Estimated: >60 mL/min (ref >60)
Glucose, Bld: 85 mg/dL (ref 70–99)
Potassium: 3.8 mmol/L (ref 3.5–5.1)
Sodium: 137 mmol/L (ref 135–145)

## 2023-12-25 MED ORDER — ACETAMINOPHEN 500 MG PO TABS: 1000.0000 mg | ORAL_TABLET | Freq: Four times a day (QID) | ORAL | 2 refills | Status: AC | PRN

## 2023-12-25 NOTE — Discharge Instructions (Signed)
 Please take your blood pressure medications as already prescribed.  You can follow-up with your primary care doctor for reevaluation, and return to the emergency department with any new or worsening symptoms.  You can use Tylenol  as needed for any recurrent headache.

## 2023-12-25 NOTE — ED Provider Notes (Signed)
 [facility] Provider Note    Event Date/Time   First MD Initiated Contact with Patient 12/25/23 2144     (approximate)   History   Hypertension  Patient to ED via POV for hypertension. Pt reports that she checked her BP at [facility] and her BP was 188/89. States she has been having headache. Denies blurred vision. Pt reports she did take BP meds today but ate ham at lunch and believes that has raised her BP.   HPI Mary Henson is a 58 y.o. female PMH hypertension, bipolar 1 disorder, anemia presents for evaluation of hypertension, headache - Patient was at [facility] checking her blood pressure, noticed it was elevated.  Also noted that she had a headache, 4/10.  Not worst headache of life, not acute onset.  Came to emergency department for eval. -Takes labetalol twice daily and lisinopril  once daily.  Took morning doses, has not yet taken evening dose.     Physical Exam   Triage Vital Signs: ED Triage Vitals [12/25/23 1456]  Encounter Vitals Group     BP (!) 182/106     Systolic BP Percentile      Diastolic BP Percentile      Pulse Rate 64     Resp 17     Temp 98 F (36.7 C)     Temp Source Oral     SpO2 92 %     Weight 197 lb (89.4 kg)     Height 5\' 4"  (1.626 m)     Head Circumference      Peak Flow      Pain Score 3     Pain Loc      Pain Education      Exclude from Growth Chart     Most recent vital signs: Vitals:   12/25/23 2230 12/25/23 2323  BP: (!) 160/74   Pulse: 66   Resp: 14   Temp:  98.6 F (37 C)  SpO2: 98%      General: Awake, no distress.  CV:  Good peripheral perfusion. RRR, RP 2+ Resp:  Normal effort. CTAB Abd:  No distention.   ED Results / Procedures / Treatments   Labs (all labs ordered are listed, but only abnormal results are displayed) Labs Reviewed  CBC WITH DIFFERENTIAL/PLATELET - Abnormal; Notable for the following components:      Result Value   RBC 3.71 (*)    Platelets 138 (*)    Monocytes  Absolute 1.1 (*)    All other components within normal limits  BASIC METABOLIC PANEL WITH GFR - Abnormal; Notable for the following components:   Creatinine, Ser 1.02 (*)    All other components within normal limits     EKG  Ecg = nsr, rate 61, no ST elevation or depression, no significant repolarization normality, some left axis deviation, normal intervals.  No evidence of ischemia nor arrhythmia on my read.   RADIOLOGY Interpreted radiology and radiology reports reviewed.  No acute pathology.    PROCEDURES:  Critical Care performed: No  Procedures   MEDICATIONS ORDERED IN ED: Medications - No data to display   IMPRESSION / MDM / ASSESSMENT AND PLAN / ED COURSE  I reviewed the triage vital signs and the nursing notes.                              DDX/MDM/AP: Differential diagnosis includes, but is not limited to, considered  but doubt hypertensive emergency-do not clinically suspect underlying subarachnoid hemorrhage and fortunately screening CT head is negative.  Blood pressure is already notably improved to 157/74 at time of my evaluation without intervention.  Will get screening EKG in addition to labs that have already been drawn, overall currently asymptomatic hypertension.  Will counsel patient to take her regular hypertension medications and follow-up with her primary care doctor.  Plan: - Screening labs and CT head at triage - Adding EKG - Counseled to take her routine blood pressure medication  Patient's presentation is most consistent with acute presentation with potential threat to life or bodily function.  The patient is on the cardiac monitor to evaluate for evidence of arrhythmia and/or significant heart rate changes.  ED course below.  EKG unremarkable, blood pressure remained overall unremarkable here in emergency department as well.  Counseled patient to take her routine home blood pressure medications and follow-up with PMD.  ED return precautions in  place.  Patient agrees with plan.  Clinical Course as of 12/26/23 0021  Mon Dec 25, 2023  2204 BAsic metabolic panel reviewed, creatinine essentially at baseline  CBC unremarkable [MM]  2205 CTH: IMPRESSION: No acute intracranial abnormality noted.   [MM]    Clinical Course User Index [MM] Collis Deaner, MD     FINAL CLINICAL IMPRESSION(S) / ED DIAGNOSES   Final diagnoses:  Hypertension, unspecified type  Nonintractable headache, unspecified chronicity pattern, unspecified headache type     Rx / DC Orders   ED Discharge Orders          Ordered    acetaminophen  (TYLENOL ) 500 MG tablet  Every 6 hours PRN        12/25/23 2224             Note:  This document was prepared using Dragon voice recognition software and may include unintentional dictation errors.   Collis Deaner, MD 12/26/23 [phone]

## 2023-12-25 NOTE — ED Triage Notes (Signed)
 Patient to ED via POV for hypertension. Pt reports that she checked her BP at [facility] and her BP was 188/89. States she has been having headache. Denies blurred vision. Pt reports she did take BP meds today but ate ham at lunch and believes that has raised her BP.

## 2024-02-05 ENCOUNTER — Ambulatory Visit (INDEPENDENT_AMBULATORY_CARE_PROVIDER_SITE_OTHER): Payer: Medicare (Managed Care) | Admitting: Urology

## 2024-02-05 VITALS — BP 187/71 | HR 71 | Ht 64.0 in | Wt 200.0 lb

## 2024-02-05 DIAGNOSIS — R32 Unspecified urinary incontinence: Secondary | ICD-10-CM | POA: Diagnosis not present

## 2024-02-05 DIAGNOSIS — N3944 Nocturnal enuresis: Secondary | ICD-10-CM

## 2024-02-05 LAB — URINALYSIS, COMPLETE
Bilirubin, UA: NEGATIVE
Glucose, UA: NEGATIVE
Ketones, UA: NEGATIVE
Leukocytes,UA: NEGATIVE
Nitrite, UA: NEGATIVE
Protein,UA: NEGATIVE
RBC, UA: NEGATIVE
Specific Gravity, UA: 1.015 (ref 1.005–1.030)
Urobilinogen, Ur: 0.2 mg/dL (ref 0.2–1.0)
pH, UA: 6.5 (ref 5.0–7.5)

## 2024-02-05 LAB — MICROSCOPIC EXAMINATION: Bacteria, UA: NONE SEEN

## 2024-02-05 MED ORDER — GEMTESA 75 MG PO TABS: 75.0000 mg | ORAL_TABLET | Freq: Every day | ORAL | 11 refills | Status: DC

## 2024-02-05 MED ORDER — GEMTESA 75 MG PO TABS: 75.0000 mg | ORAL_TABLET | Freq: Every day | ORAL | Status: AC

## 2024-02-05 NOTE — Patient Instructions (Signed)

## 2024-02-05 NOTE — Progress Notes (Signed)
 02/05/2024 9:59 AM   Brandy Cal [birth year] [id number]  Referring provider: Monique Ano, MD [phone] [facility] [address] [facility] [place],  [place] [address]  No chief complaint on file.   HPI: I was consulted to assess the patient for any frequent episodes of bedwetting last 6 months.  It may happen once every 2 weeks.  It could be mild.  Once was high-volume.  She leaked once during the day but is otherwise continent.  She voids every 4 hours gets up twice at night.  Her flow was good  No neurologic issues.  No hysterectomy  No history of kidney stones bladder surgery or bladder infections   PMH: Past Medical History:  Diagnosis Date   Anemia    Bipolar disorder (HCC)    Hypertension    Sleep apnea     Surgical History: Past Surgical History:  Procedure Laterality Date   DILATATION & CURETTAGE/HYSTEROSCOPY WITH MYOSURE N/A 06/17/2022   Procedure: DILATATION & CURETTAGE/HYSTEROSCOPY WITH POSSIBLE POLYPECTOMY;  Surgeon: Prescilla Brod, MD;  Location: [facility] ORS;  Service: Gynecology;  Laterality: N/A;   MYOMECTOMY     UTERINE FIBROID EMBOLIZATION      Home Medications:  Allergies as of 02/05/2024       Reactions   Lamotrigine Rash, Swelling   Swollen tongue Swollen tongue   Penicillins Swelling   Other reaction(s): Angioedema   Clindamycin/lincomycin Rash   Hydrochlorothiazide Other (See Comments), Rash   Other reaction(s): Other (See Comments) Hyponatremia Hyponatremia Hyponatremia Hyponatremia   Other Rash   Amiloride Hydrochloride Dihydrate [amiloride]         Medication List        Accurate as of February 05, 2024  9:59 AM. If you have any questions, ask your nurse or doctor.          acetaminophen  500 MG tablet Commonly known as: TYLENOL  Take 2 tablets (1,000 mg total) by mouth every 6 (six) hours as needed.   albuterol  108 (90 Base) MCG/ACT inhaler Commonly known as: VENTOLIN  HFA Inhale 2 puffs into the lungs every 6 (six) hours  as needed for wheezing or shortness of breath.   benztropine 1 MG tablet Commonly known as: COGENTIN Take 1 mg by mouth 2 (two) times daily.   divalproex 500 MG DR tablet Commonly known as: DEPAKOTE Take 1,500 mg by mouth at bedtime.   furosemide 40 MG tablet Commonly known as: LASIX Take 40 mg by mouth daily.   hydrOXYzine 25 MG tablet Commonly known as: ATARAX Take 25-50 mg by mouth every 8 (eight) hours as needed for anxiety.   IRON 27 PO Take 1 tablet by mouth daily.   LABETALOL HCL PO Take 200 mg by mouth 2 (two) times daily.   lisinopril  30 MG tablet Commonly known as: ZESTRIL  Take 30 mg by mouth daily.   metFORMIN 500 MG tablet Commonly known as: GLUCOPHAGE Take 500 mg by mouth 2 (two) times daily with a meal.   NORETHINDRONE PO Take 0.35 mg by mouth daily.   OLANZapine  20 MG tablet Commonly known as: ZYPREXA  Take 20 mg by mouth at bedtime.        Allergies:  Allergies  Allergen Reactions   Lamotrigine Rash and Swelling    Swollen tongue Swollen tongue    Penicillins Swelling    Other reaction(s): Angioedema   Clindamycin/Lincomycin Rash   Hydrochlorothiazide Other (See Comments) and Rash    Other reaction(s): Other (See Comments) Hyponatremia Hyponatremia Hyponatremia Hyponatremia    Other Rash  Amiloride Hydrochloride Dihydrate [Amiloride]     Family History: Family History  Problem Relation Age of Onset   Ovarian cancer Other        MGAunt    Social History:  reports that she has never smoked. She has never used smokeless tobacco. She reports that she does not drink alcohol and does not use drugs.  ROS:                                        Physical Exam: There were no vitals taken for this visit.  Constitutional:  Alert and oriented, No acute distress. HEENT: [place] AT, moist mucus membranes.  Trachea midline, no masses.  Laboratory Data: Lab Results  Component Value Date   WBC 6.9 12/25/2023   HGB  12.2 12/25/2023   HCT 37.0 12/25/2023   MCV 99.7 12/25/2023   PLT 138 (L) 12/25/2023    Lab Results  Component Value Date   CREATININE 1.02 (H) 12/25/2023    No results found for: PSA  No results found for: TESTOSTERONE  No results found for: HGBA1C  Urinalysis    Component Value Date/Time   COLORURINE STRAW (A) 07/09/2020 0354   APPEARANCEUR CLEAR (A) 07/09/2020 0354   LABSPEC 1.003 (L) 07/09/2020 0354   PHURINE 7.0 07/09/2020 0354   GLUCOSEU NEGATIVE 07/09/2020 0354   HGBUR SMALL (A) 07/09/2020 0354   BILIRUBINUR NEGATIVE 07/09/2020 0354   KETONESUR NEGATIVE 07/09/2020 0354   PROTEINUR NEGATIVE 07/09/2020 0354   NITRITE NEGATIVE 07/09/2020 0354   LEUKOCYTESUR TRACE (A) 07/09/2020 0354    Pertinent Imaging: Urine negative.  Urine sent for culture.  Chart reviewed  Assessment & Plan: Patient understands that she experience bladder overactivity with leaking.  Call if culture positive.  Role of physical therapy and medication discussed.  Role of safety cystoscopy discussed.  No other obvious neurologic issues at this stage  Call if urine culture positive.  Physical therapy consultation sent.  Come back on Gemtesa samples and prescription for pelvic examination cystoscopy  1. Urinary incontinence, unspecified type (Primary)    No follow-ups on file.  Devorah Fonder, MD  [facility] [address] [place], [place] [address] [phone]

## 2024-02-08 LAB — CULTURE, URINE COMPREHENSIVE

## 2024-02-22 ENCOUNTER — Other Ambulatory Visit: Payer: Self-pay

## 2024-02-22 DIAGNOSIS — R399 Unspecified symptoms and signs involving the genitourinary system: Secondary | ICD-10-CM

## 2024-02-24 NOTE — Progress Notes (Deleted)
 02/26/2024 12:15 PM   Mary Henson [birth year] [id number]  Referring provider: Alla Amis, MD [phone] [facility] [address] [facility] [place],  [place] [address]  Urological history: 1. OAB  2. Nocturnal enuresis  No chief complaint on file.  HPI: Mary Henson is a 58 y.o. woman who presents today for pain with urination.    Previous records reviewed.   UA ***    PMH: Past Medical History:  Diagnosis Date   Anemia    Bipolar disorder (HCC)    Hypertension    Sleep apnea     Surgical History: Past Surgical History:  Procedure Laterality Date   DILATATION & CURETTAGE/HYSTEROSCOPY WITH MYOSURE N/A 06/17/2022   Procedure: DILATATION & CURETTAGE/HYSTEROSCOPY WITH POSSIBLE POLYPECTOMY;  Surgeon: Verdon Keen, MD;  Location: [facility] ORS;  Service: Gynecology;  Laterality: N/A;   MYOMECTOMY     UTERINE FIBROID EMBOLIZATION      Home Medications:  Allergies as of 02/26/2024       Reactions   Lamotrigine Rash, Swelling   Swollen tongue Swollen tongue   Penicillins Swelling   Other reaction(s): Angioedema   Clindamycin/lincomycin Rash   Hydrochlorothiazide Other (See Comments), Rash   Other reaction(s): Other (See Comments) Hyponatremia Hyponatremia Hyponatremia Hyponatremia   Other Rash   Amiloride Hydrochloride Dihydrate [amiloride]         Medication List        Accurate as of February 24, 2024 12:15 PM. If you have any questions, ask your nurse or doctor.          acetaminophen  500 MG tablet Commonly known as: TYLENOL  Take 2 tablets (1,000 mg total) by mouth every 6 (six) hours as needed.   albuterol  108 (90 Base) MCG/ACT inhaler Commonly known as: VENTOLIN  HFA Inhale 2 puffs into the lungs every 6 (six) hours as needed for wheezing or shortness of breath.   benztropine 1 MG tablet Commonly known as: COGENTIN Take 1 mg by mouth 2 (two) times daily.   divalproex 500 MG DR tablet Commonly known as: DEPAKOTE Take 1,500 mg by mouth at  bedtime.   furosemide 40 MG tablet Commonly known as: LASIX Take 40 mg by mouth daily.   Gemtesa  75 MG Tabs Generic drug: Vibegron  Take 1 tablet (75 mg total) by mouth daily for 28 days.   Gemtesa  75 MG Tabs Generic drug: Vibegron  Take 1 tablet (75 mg total) by mouth daily.   hydrOXYzine 25 MG tablet Commonly known as: ATARAX Take 25-50 mg by mouth every 8 (eight) hours as needed for anxiety.   IRON 27 PO Take 1 tablet by mouth daily.   LABETALOL HCL PO Take 200 mg by mouth 2 (two) times daily.   lisinopril  30 MG tablet Commonly known as: ZESTRIL  Take 30 mg by mouth daily.   metFORMIN 500 MG tablet Commonly known as: GLUCOPHAGE Take 500 mg by mouth 2 (two) times daily with a meal.   NORETHINDRONE PO Take 0.35 mg by mouth daily.   OLANZapine  20 MG tablet Commonly known as: ZYPREXA  Take 20 mg by mouth at bedtime.        Allergies:  Allergies  Allergen Reactions   Lamotrigine Rash and Swelling    Swollen tongue Swollen tongue    Penicillins Swelling    Other reaction(s): Angioedema   Clindamycin/Lincomycin Rash   Hydrochlorothiazide Other (See Comments) and Rash    Other reaction(s): Other (See Comments) Hyponatremia Hyponatremia Hyponatremia Hyponatremia    Other Rash   Amiloride Hydrochloride Dihydrate [  Amiloride]     Family History: Family History  Problem Relation Age of Onset   Ovarian cancer Other        MGAunt    Social History: See HPI for pertinent social history  ROS: Pertinent ROS in HPI  Physical Exam: There were no vitals taken for this visit.  Constitutional:  Well nourished. Alert and oriented, No acute distress. HEENT: [place] AT, moist mucus membranes.  Trachea midline Cardiovascular: No clubbing, cyanosis, or edema. Respiratory: Normal respiratory effort, no increased work of breathing. Neurologic: Grossly intact, no focal deficits, moving all 4 extremities. Psychiatric: Normal mood and affect.    Laboratory Data: See  EPIC and HPI  I have reviewed the labs.   Pertinent Imaging: N/A  Assessment & Plan:  ***  1. Suspected UTI -UA grossly infected  -Urine culture pending -Started empirically on ***, will adjust if necessary once urine culture and sensitivity results are available   -Ceftin 500 mg twice daily for seven days *** -Ceftin 250 mg twice daily for seven days *** -Septra DS twice daily for seven days *** -Augmentin 875/125 twice daily for seven days *** -Macrobid 100 mg twice daily for seven days *** -Doxycycline 100 mg twice daily for seven days *** -Omnicef 300 mg twice daily for seven days ***   No follow-ups on file.  These notes generated with voice recognition software. I apologize for typographical errors.  [name]  [facility] [address]  [address] [place], [place] [address] [phone]

## 2024-02-26 ENCOUNTER — Ambulatory Visit: Payer: Medicare (Managed Care) | Admitting: Urology

## 2024-02-26 DIAGNOSIS — R399 Unspecified symptoms and signs involving the genitourinary system: Secondary | ICD-10-CM

## 2024-03-04 ENCOUNTER — Ambulatory Visit: Payer: Medicare (Managed Care) | Admitting: Urology

## 2024-03-04 VITALS — BP 161/91 | HR 83 | Ht 64.0 in | Wt 200.0 lb

## 2024-03-04 DIAGNOSIS — N3944 Nocturnal enuresis: Secondary | ICD-10-CM

## 2024-03-04 DIAGNOSIS — R32 Unspecified urinary incontinence: Secondary | ICD-10-CM

## 2024-03-04 LAB — URINALYSIS, COMPLETE
Bilirubin, UA: NEGATIVE
Glucose, UA: NEGATIVE
Ketones, UA: NEGATIVE
Leukocytes,UA: NEGATIVE
Nitrite, UA: NEGATIVE
Protein,UA: NEGATIVE
RBC, UA: NEGATIVE
Specific Gravity, UA: 1.015 (ref 1.005–1.030)
Urobilinogen, Ur: 0.2 mg/dL (ref 0.2–1.0)
pH, UA: 6 (ref 5.0–7.5)

## 2024-03-04 LAB — MICROSCOPIC EXAMINATION: Epithelial Cells (non renal): >10 /HPF — AB (ref 0–10)

## 2024-03-04 MED ORDER — GEMTESA 75 MG PO TABS
75.0000 mg | ORAL_TABLET | Freq: Every day | ORAL | Status: AC
Start: 2024-03-04 — End: 2024-04-01

## 2024-03-04 MED ORDER — GEMTESA 75 MG PO TABS: 75.0000 mg | ORAL_TABLET | Freq: Every day | ORAL | 11 refills | Status: DC

## 2024-03-04 NOTE — Addendum Note (Signed)
 Addended byBETHA CORIE PLATER on: 03/04/2024 02:08 PM   Modules accepted: Orders

## 2024-03-04 NOTE — Progress Notes (Addendum)
 03/04/2024 1:56 PM   Mary Henson September 07, 1965 [id number]  Referring provider: Alla Amis, MD [phone] [facility] [address] [facility] [place],  [place] [address]  No chief complaint on file.   HPI: I was consulted to assess the patient for any frequent episodes of bedwetting last 6 months. It may happen once every 2 weeks. It could be mild. Once was high-volume. She leaked once during the day but is otherwise continent. She voids every 4 hours gets up twice at night. Her flow was good     Patient understands that she experience bladder overactivity with leaking.  Call if culture positive.  Role of physical therapy and medication discussed.  Role of safety cystoscopy discussed.  No other obvious neurologic issues at this stage   Call if urine culture positive.  Physical therapy consultation sent.  Come back on Gemtesa  samples and prescription for pelvic examination cystoscopy  Today Last culture negative.  Frequency increased in the last week but no other cystitis symptoms.  Completely dry at night.  Otherwise clinically not infected   PMH: Past Medical History:  Diagnosis Date   Anemia    Bipolar disorder (HCC)    Hypertension    Sleep apnea     Surgical History: Past Surgical History:  Procedure Laterality Date   DILATATION & CURETTAGE/HYSTEROSCOPY WITH MYOSURE N/A 06/17/2022   Procedure: DILATATION & CURETTAGE/HYSTEROSCOPY WITH POSSIBLE POLYPECTOMY;  Surgeon: Verdon Keen, MD;  Location: [facility] ORS;  Service: Gynecology;  Laterality: N/A;   MYOMECTOMY     UTERINE FIBROID EMBOLIZATION      Home Medications:  Allergies as of 03/04/2024       Reactions   Lamotrigine Rash, Swelling   Swollen tongue Swollen tongue   Penicillins Swelling   Other reaction(s): Angioedema   Clindamycin/lincomycin Rash   Hydrochlorothiazide Other (See Comments), Rash   Other reaction(s): Other (See Comments) Hyponatremia Hyponatremia Hyponatremia Hyponatremia   Other Rash    Amiloride Hydrochloride Dihydrate [amiloride]         Medication List        Accurate as of March 04, 2024  1:56 PM. If you have any questions, ask your nurse or doctor.          acetaminophen  500 MG tablet Commonly known as: TYLENOL  Take 2 tablets (1,000 mg total) by mouth every 6 (six) hours as needed.   albuterol  108 (90 Base) MCG/ACT inhaler Commonly known as: VENTOLIN  HFA Inhale 2 puffs into the lungs every 6 (six) hours as needed for wheezing or shortness of breath.   benztropine 1 MG tablet Commonly known as: COGENTIN Take 1 mg by mouth 2 (two) times daily.   divalproex 500 MG DR tablet Commonly known as: DEPAKOTE Take 1,500 mg by mouth at bedtime.   furosemide 40 MG tablet Commonly known as: LASIX Take 40 mg by mouth daily.   Gemtesa  75 MG Tabs Generic drug: Vibegron  Take 1 tablet (75 mg total) by mouth daily for 28 days.   Gemtesa  75 MG Tabs Generic drug: Vibegron  Take 1 tablet (75 mg total) by mouth daily.   hydrOXYzine 25 MG tablet Commonly known as: ATARAX Take 25-50 mg by mouth every 8 (eight) hours as needed for anxiety.   IRON 27 PO Take 1 tablet by mouth daily.   LABETALOL HCL PO Take 200 mg by mouth 2 (two) times daily.   lisinopril  30 MG tablet Commonly known as: ZESTRIL  Take 30 mg by mouth daily.   metFORMIN 500 MG tablet Commonly known  as: GLUCOPHAGE Take 500 mg by mouth 2 (two) times daily with a meal.   NORETHINDRONE PO Take 0.35 mg by mouth daily.   OLANZapine  20 MG tablet Commonly known as: ZYPREXA  Take 20 mg by mouth at bedtime.        Allergies:  Allergies  Allergen Reactions   Lamotrigine Rash and Swelling    Swollen tongue Swollen tongue    Penicillins Swelling    Other reaction(s): Angioedema   Clindamycin/Lincomycin Rash   Hydrochlorothiazide Other (See Comments) and Rash    Other reaction(s): Other (See Comments) Hyponatremia Hyponatremia Hyponatremia Hyponatremia    Other Rash   Amiloride  Hydrochloride Dihydrate [Amiloride]     Family History: Family History  Problem Relation Age of Onset   Ovarian cancer Other        MGAunt    Social History:  reports that she has never smoked. She has never used smokeless tobacco. She reports that she does not drink alcohol and does not use drugs.  ROS:                                        Physical Exam: There were no vitals taken for this visit.  Constitutional:  Alert and oriented, No acute distress. HEENT: [place] AT, moist mucus membranes.  Trachea midline, no masses.  Laboratory Data: Lab Results  Component Value Date   WBC 6.9 12/25/2023   HGB 12.2 12/25/2023   HCT 37.0 12/25/2023   MCV 99.7 12/25/2023   PLT 138 (L) 12/25/2023    Lab Results  Component Value Date   CREATININE 1.02 (H) 12/25/2023    No results found for: PSA  No results found for: TESTOSTERONE  No results found for: HGBA1C  Urinalysis    Component Value Date/Time   COLORURINE STRAW (A) 07/09/2020 0354   APPEARANCEUR Clear 02/05/2024 1000   LABSPEC 1.003 (L) 07/09/2020 0354   PHURINE 7.0 07/09/2020 0354   GLUCOSEU Negative 02/05/2024 1000   HGBUR SMALL (A) 07/09/2020 0354   BILIRUBINUR Negative 02/05/2024 1000   KETONESUR NEGATIVE 07/09/2020 0354   PROTEINUR Negative 02/05/2024 1000   PROTEINUR NEGATIVE 07/09/2020 0354   NITRITE Negative 02/05/2024 1000   NITRITE NEGATIVE 07/09/2020 0354   LEUKOCYTESUR Negative 02/05/2024 1000   LEUKOCYTESUR TRACE (A) 07/09/2020 0354    Pertinent Imaging:   Assessment & Plan: Reassess on Gemtesa  in 4 months.  Call if culture positive.  Will perform cystoscopy next visit.  Today may have been an add-on visit  1. Urinary incontinence, unspecified type (Primary)  - Urinalysis, Complete - CULTURE, URINE COMPREHENSIVE   No follow-ups on file.  Mary DELENA Elizabeth, MD  [facility] [address] [place], [place] [address] [phone]

## 2024-03-06 LAB — CULTURE, URINE COMPREHENSIVE

## 2024-04-29 ENCOUNTER — Ambulatory Visit: Payer: Medicare (Managed Care) | Admitting: Urology

## 2024-04-29 VITALS — BP 168/90 | HR 80

## 2024-04-29 DIAGNOSIS — R32 Unspecified urinary incontinence: Secondary | ICD-10-CM | POA: Diagnosis not present

## 2024-04-29 MED ORDER — GEMTESA 75 MG PO TABS: 75.0000 mg | ORAL_TABLET | Freq: Every day | ORAL | Status: AC

## 2024-04-29 NOTE — Progress Notes (Signed)
 04/29/2024 10:16 AM   Mary Henson May 02, 1966 [id number]  Referring provider: Alla Amis, MD [phone] [facility] [address] [facility] [place],  [place] [address]  Chief Complaint  Patient presents with   Cysto    HPI: I was consulted to assess the patient for any frequent episodes of bedwetting last 6 months. It may happen once every 2 weeks. It could be mild. Once was high-volume. She leaked once during the day but is otherwise continent. She voids every 4 hours gets up twice at night. Her flow was good      Patient understands that she experience bladder overactivity with leaking.  Call if culture positive.  Role of physical therapy and medication discussed.  Role of safety cystoscopy discussed.  No other obvious neurologic issues at this stage   Call if urine culture positive.  Physical therapy consultation sent.  Come back on Gemtesa  samples and prescription for pelvic examination cystoscopy   Today Last culture negative.  Frequency increased in the last week but no other cystitis symptoms.  Completely dry at night.  Otherwise clinically not infected Reassess on Gemtesa  in 4 months.  Call if culture positive.  Will perform cystoscopy next visit.  Today may have been an add-on visit   Today Frequency stable.  Last culture negative Patient is staying dry during the day and night with no enuresis.  Once she had a brownish fluid from the vagina and once per rectum.  She has had an embolization of the uterus but not hysterectomy.  On pelvic examination no prolapse or stress incontinence before and after cystoscopy.  On gentle double speculum exam no fluid in the vagina.  She had 1 spot of blood above the meatus before I started likely from dryness and preparation.  Cystoscopy: Patient underwent flexible cystoscopy.  Bladder mucosa and trigone were normal.  No cystitis.  No carcinoma.       PMH: Past Medical History:  Diagnosis Date   Anemia    Bipolar disorder (HCC)     Hypertension    Sleep apnea     Surgical History: Past Surgical History:  Procedure Laterality Date   DILATATION & CURETTAGE/HYSTEROSCOPY WITH MYOSURE N/A 06/17/2022   Procedure: DILATATION & CURETTAGE/HYSTEROSCOPY WITH POSSIBLE POLYPECTOMY;  Surgeon: Verdon Keen, MD;  Location: [facility] ORS;  Service: Gynecology;  Laterality: N/A;   MYOMECTOMY     UTERINE FIBROID EMBOLIZATION      Home Medications:  Allergies as of 04/29/2024       Reactions   Lamotrigine Rash, Swelling   Swollen tongue Swollen tongue   Penicillins Swelling   Other reaction(s): Angioedema   Clindamycin/lincomycin Rash   Hydrochlorothiazide Other (See Comments), Rash   Other reaction(s): Other (See Comments) Hyponatremia Hyponatremia Hyponatremia Hyponatremia   Other Rash   Amiloride Hydrochloride Dihydrate [amiloride]         Medication List        Accurate as of April 29, 2024 10:16 AM. If you have any questions, ask your nurse or doctor.          acetaminophen  500 MG tablet Commonly known as: TYLENOL  Take 2 tablets (1,000 mg total) by mouth every 6 (six) hours as needed.   albuterol  108 (90 Base) MCG/ACT inhaler Commonly known as: VENTOLIN  HFA Inhale 2 puffs into the lungs every 6 (six) hours as needed for wheezing or shortness of breath.   benztropine 1 MG tablet Commonly known as: COGENTIN Take 1 mg by mouth 2 (two) times daily.  divalproex 500 MG DR tablet Commonly known as: DEPAKOTE Take 1,500 mg by mouth at bedtime.   furosemide 40 MG tablet Commonly known as: LASIX Take 40 mg by mouth daily.   Gemtesa  75 MG Tabs Generic drug: Vibegron  Take 1 tablet (75 mg total) by mouth daily.   Gemtesa  75 MG Tabs Generic drug: Vibegron  Take 1 tablet (75 mg total) by mouth daily.   hydrOXYzine 25 MG tablet Commonly known as: ATARAX Take 25-50 mg by mouth every 8 (eight) hours as needed for anxiety.   IRON 27 PO Take 1 tablet by mouth daily.   LABETALOL HCL PO Take 200 mg by  mouth 2 (two) times daily.   lisinopril  30 MG tablet Commonly known as: ZESTRIL  Take 30 mg by mouth daily.   metFORMIN 500 MG tablet Commonly known as: GLUCOPHAGE Take 500 mg by mouth 2 (two) times daily with a meal.   NORETHINDRONE PO Take 0.35 mg by mouth daily.   OLANZapine  20 MG tablet Commonly known as: ZYPREXA  Take 20 mg by mouth at bedtime.        Allergies:  Allergies  Allergen Reactions   Lamotrigine Rash and Swelling    Swollen tongue Swollen tongue    Penicillins Swelling    Other reaction(s): Angioedema   Clindamycin/Lincomycin Rash   Hydrochlorothiazide Other (See Comments) and Rash    Other reaction(s): Other (See Comments) Hyponatremia Hyponatremia Hyponatremia Hyponatremia    Other Rash   Amiloride Hydrochloride Dihydrate [Amiloride]     Family History: Family History  Problem Relation Age of Onset   Ovarian cancer Other        MGAunt    Social History:  reports that she has never smoked. She has never used smokeless tobacco. She reports that she does not drink alcohol and does not use drugs.  ROS:                                        Physical Exam: There were no vitals taken for this visit.  Constitutional:  Alert and oriented, No acute distress.   Laboratory Data: Lab Results  Component Value Date   WBC 6.9 12/25/2023   HGB 12.2 12/25/2023   HCT 37.0 12/25/2023   MCV 99.7 12/25/2023   PLT 138 (L) 12/25/2023    Lab Results  Component Value Date   CREATININE 1.02 (H) 12/25/2023    No results found for: PSA  No results found for: TESTOSTERONE  No results found for: HGBA1C  Urinalysis    Component Value Date/Time   COLORURINE STRAW (A) 07/09/2020 0354   APPEARANCEUR Clear 03/04/2024 1421   LABSPEC 1.003 (L) 07/09/2020 0354   PHURINE 7.0 07/09/2020 0354   GLUCOSEU Negative 03/04/2024 1421   HGBUR SMALL (A) 07/09/2020 0354   BILIRUBINUR Negative 03/04/2024 1421   KETONESUR NEGATIVE  07/09/2020 0354   PROTEINUR Negative 03/04/2024 1421   PROTEINUR NEGATIVE 07/09/2020 0354   NITRITE Negative 03/04/2024 1421   NITRITE NEGATIVE 07/09/2020 0354   LEUKOCYTESUR Negative 03/04/2024 1421   LEUKOCYTESUR TRACE (A) 07/09/2020 0354    Pertinent Imaging: Could not leave urine sample  Assessment & Plan: I would like to keep the patient on Gemtesa  and have her follow-up with her gynecologist for discharge from the vagina and/or rectum.  I also mention seeing a gastroenterologist or primary care to check for blood in the stool.  She said she did see  her gynecologist and already has a colonoscopy scheduled.  30 x 11 Gemtesa  sent to pharmacy and I will see you in 1 year.  I do not think the fluid loss was from the bladder.  1. Urinary incontinence, unspecified type (Primary)  - Urinalysis, Complete   No follow-ups on file.  Mary DELENA Elizabeth, MD  [facility] [address] [place], [place] [address] [phone]

## 2024-04-29 NOTE — Addendum Note (Signed)
 Addended byBETHA CORIE PLATER on: 04/29/2024 12:58 PM   Modules accepted: Orders

## 2024-05-20 ENCOUNTER — Ambulatory Visit: Payer: Medicare (Managed Care) | Attending: Urology

## 2024-05-20 ENCOUNTER — Other Ambulatory Visit: Payer: Self-pay

## 2024-05-20 DIAGNOSIS — R32 Unspecified urinary incontinence: Secondary | ICD-10-CM | POA: Diagnosis not present

## 2024-05-20 DIAGNOSIS — M6281 Muscle weakness (generalized): Secondary | ICD-10-CM | POA: Diagnosis present

## 2024-05-20 DIAGNOSIS — N3944 Nocturnal enuresis: Secondary | ICD-10-CM | POA: Insufficient documentation

## 2024-05-20 DIAGNOSIS — R293 Abnormal posture: Secondary | ICD-10-CM | POA: Insufficient documentation

## 2024-05-20 DIAGNOSIS — N39498 Other specified urinary incontinence: Secondary | ICD-10-CM | POA: Insufficient documentation

## 2024-05-20 DIAGNOSIS — R2689 Other abnormalities of gait and mobility: Secondary | ICD-10-CM | POA: Insufficient documentation

## 2024-05-20 NOTE — Patient Instructions (Signed)
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
## 2024-06-03 ENCOUNTER — Other Ambulatory Visit: Payer: Self-pay

## 2024-06-03 ENCOUNTER — Ambulatory Visit: Payer: Medicare (Managed Care) | Attending: Urology

## 2024-06-03 DIAGNOSIS — R293 Abnormal posture: Secondary | ICD-10-CM | POA: Diagnosis present

## 2024-06-03 DIAGNOSIS — R2689 Other abnormalities of gait and mobility: Secondary | ICD-10-CM | POA: Insufficient documentation

## 2024-06-03 DIAGNOSIS — M6281 Muscle weakness (generalized): Secondary | ICD-10-CM | POA: Diagnosis present

## 2024-06-03 DIAGNOSIS — N39498 Other specified urinary incontinence: Secondary | ICD-10-CM | POA: Insufficient documentation
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
## 2024-06-10 ENCOUNTER — Ambulatory Visit: Payer: Medicare (Managed Care)

## 2024-06-17 ENCOUNTER — Ambulatory Visit: Payer: Medicare (Managed Care)

## 2024-06-24 ENCOUNTER — Ambulatory Visit: Payer: Medicare (Managed Care)

## 2024-07-01 ENCOUNTER — Ambulatory Visit: Payer: Medicare (Managed Care)

## 2024-07-01 ENCOUNTER — Ambulatory Visit: Payer: Medicare (Managed Care) | Admitting: Urology

## 2024-07-01 VITALS — BP 178/94 | HR 83

## 2024-07-01 DIAGNOSIS — R32 Unspecified urinary incontinence: Secondary | ICD-10-CM

## 2024-07-01 LAB — URINALYSIS, COMPLETE
Bilirubin, UA: NEGATIVE
Glucose, UA: NEGATIVE
Ketones, UA: NEGATIVE
Leukocytes,UA: NEGATIVE
Nitrite, UA: NEGATIVE
Protein,UA: NEGATIVE
RBC, UA: NEGATIVE
Specific Gravity, UA: 1.01 (ref 1.005–1.030)
Urobilinogen, Ur: 0.2 mg/dL (ref 0.2–1.0)
pH, UA: 6 (ref 5.0–7.5)

## 2024-07-01 LAB — MICROSCOPIC EXAMINATION

## 2024-07-01 MED ORDER — GEMTESA 75 MG PO TABS: 75.0000 mg | ORAL_TABLET | Freq: Every day | ORAL | 11 refills | Status: AC

## 2024-07-01 NOTE — Progress Notes (Signed)
 07/01/2024 1:14 PM   Mary Henson May 29, 1966 [id number]  Referring provider: Alla Amis, MD [phone] [facility] [address] [facility] [place],  [place] [address]  Chief Complaint  Patient presents with   Follow-up   Urinary Incontinence    HPI: I was consulted to assess the patient for any frequent episodes of bedwetting last 6 months. It may happen once every 2 weeks. It could be mild. Once was high-volume. She leaked once during the day but is otherwise continent. She voids every 4 hours gets up twice at night. Her flow was good      Patient understands that she experience bladder overactivity with leaking.  Call if culture positive.  Role of physical therapy and medication discussed.  Role of safety cystoscopy discussed.  No other obvious neurologic issues at this stage   Call if urine culture positive.  Physical therapy consultation sent.  Come back on Gemtesa  samples  Last culture negative.  Frequency increased in the last week but no other cystitis symptoms.  Completely dry at night.  Otherwise clinically not infected Reassess on Gemtesa  in 4 months.  Call if culture positive.  Will perform cystoscopy next visit.     Patient is staying dry during the day and night with no enuresis.  Once she had a brownish fluid from the vagina and once per rectum.  She has had an embolization of the uterus but not hysterectomy.   On pelvic examination no prolapse or stress incontinence before and after cystoscopy.  On gentle double speculum exam no fluid in the vagina.  She had 1 spot of blood above the meatus before I started likely from dryness and preparation.   Cystoscopy: normal   I would like to keep the patient on Gemtesa  and have her follow-up with her gynecologist for discharge from the vagina and/or rectum.  I also mention seeing a gastroenterologist or primary care to check for blood in the stool.  She said she did see her gynecologist and already has a colonoscopy scheduled.   30 x 11 Gemtesa  sent to pharmacy and I will see you in 1 year.  I do not think the fluid loss was from the bladder.   Today Last saw the patient September 2025.  No more bedwetting.  No infections.  Is getting a pelvic ultrasound in the next 2 weeks by gynecology.  Very pleased.  Frequency improved   PMH: Past Medical History:  Diagnosis Date   Anemia    Bipolar disorder (HCC)    Hypertension    Sleep apnea     Surgical History: Past Surgical History:  Procedure Laterality Date   DILATATION & CURETTAGE/HYSTEROSCOPY WITH MYOSURE N/A 06/17/2022   Procedure: DILATATION & CURETTAGE/HYSTEROSCOPY WITH POSSIBLE POLYPECTOMY;  Surgeon: Verdon Keen, MD;  Location: [facility] ORS;  Service: Gynecology;  Laterality: N/A;   MYOMECTOMY     UTERINE FIBROID EMBOLIZATION      Home Medications:  Allergies as of 07/01/2024       Reactions   Lamotrigine Rash, Swelling   Swollen tongue Swollen tongue   Penicillins Swelling   Other reaction(s): Angioedema   Clindamycin/lincomycin Rash   Hydrochlorothiazide Other (See Comments), Rash   Other reaction(s): Other (See Comments) Hyponatremia Hyponatremia Hyponatremia Hyponatremia   Other Rash   Amiloride Hydrochloride Dihydrate [amiloride]         Medication List        Accurate as of July 01, 2024  1:14 PM. If you have any questions, ask your nurse  or doctor.          acetaminophen  500 MG tablet Commonly known as: TYLENOL  Take 2 tablets (1,000 mg total) by mouth every 6 (six) hours as needed.   albuterol  108 (90 Base) MCG/ACT inhaler Commonly known as: VENTOLIN  HFA Inhale 2 puffs into the lungs every 6 (six) hours as needed for wheezing or shortness of breath.   benztropine 1 MG tablet Commonly known as: COGENTIN Take 1 mg by mouth 2 (two) times daily.   divalproex 500 MG DR tablet Commonly known as: DEPAKOTE Take 1,500 mg by mouth at bedtime.   furosemide 40 MG tablet Commonly known as: LASIX Take 40 mg by mouth  daily.   Gemtesa  75 MG Tabs Generic drug: Vibegron  Take 1 tablet (75 mg total) by mouth daily.   hydrOXYzine 25 MG tablet Commonly known as: ATARAX Take 25-50 mg by mouth every 8 (eight) hours as needed for anxiety.   IRON 27 PO Take 1 tablet by mouth daily.   LABETALOL HCL PO Take 200 mg by mouth 2 (two) times daily.   lisinopril  30 MG tablet Commonly known as: ZESTRIL  Take 30 mg by mouth daily.   metFORMIN 500 MG tablet Commonly known as: GLUCOPHAGE Take 500 mg by mouth 2 (two) times daily with a meal.   NORETHINDRONE PO Take 0.35 mg by mouth daily.   OLANZapine  20 MG tablet Commonly known as: ZYPREXA  Take 20 mg by mouth at bedtime.        Allergies:  Allergies  Allergen Reactions   Lamotrigine Rash and Swelling    Swollen tongue Swollen tongue    Penicillins Swelling    Other reaction(s): Angioedema   Clindamycin/Lincomycin Rash   Hydrochlorothiazide Other (See Comments) and Rash    Other reaction(s): Other (See Comments) Hyponatremia Hyponatremia Hyponatremia Hyponatremia    Other Rash   Amiloride Hydrochloride Dihydrate [Amiloride]     Family History: Family History  Problem Relation Age of Onset   Ovarian cancer Other        MGAunt    Social History:  reports that she has never smoked. She has never used smokeless tobacco. She reports that she does not drink alcohol and does not use drugs.  ROS:                                        Physical Exam: BP (!) 178/94   Pulse 83   Constitutional:  Alert and oriented, No acute distress. HEENT: [place] AT, moist mucus membranes.  Trachea midline, no masses.  Laboratory Data: Lab Results  Component Value Date   WBC 6.9 12/25/2023   HGB 12.2 12/25/2023   HCT 37.0 12/25/2023   MCV 99.7 12/25/2023   PLT 138 (L) 12/25/2023    Lab Results  Component Value Date   CREATININE 1.02 (H) 12/25/2023    No results found for: PSA  No results found for: TESTOSTERONE  No  results found for: HGBA1C  Urinalysis    Component Value Date/Time   COLORURINE STRAW (A) 07/09/2020 0354   APPEARANCEUR Clear 03/04/2024 1421   LABSPEC 1.003 (L) 07/09/2020 0354   PHURINE 7.0 07/09/2020 0354   GLUCOSEU Negative 03/04/2024 1421   HGBUR SMALL (A) 07/09/2020 0354   BILIRUBINUR Negative 03/04/2024 1421   KETONESUR NEGATIVE 07/09/2020 0354   PROTEINUR Negative 03/04/2024 1421   PROTEINUR NEGATIVE 07/09/2020 0354   NITRITE Negative 03/04/2024 1421  NITRITE NEGATIVE 07/09/2020 0354   LEUKOCYTESUR Negative 03/04/2024 1421   LEUKOCYTESUR TRACE (A) 07/09/2020 0354    Pertinent Imaging:   Assessment & Plan: Gemtesa  30 x 11 sent to pharmacy and I will see in 1 year  1. Urinary incontinence, unspecified type (Primary)  - Urinalysis, Complete   No follow-ups on file.  [name], MD  [facility] [address], [address] [place], [place] [address] [phone]

## 2024-07-08 ENCOUNTER — Ambulatory Visit: Payer: Medicare (Managed Care)

## 2024-07-15 ENCOUNTER — Other Ambulatory Visit: Payer: Self-pay

## 2024-07-15 ENCOUNTER — Ambulatory Visit: Payer: Medicare (Managed Care) | Attending: Urology

## 2024-07-15 DIAGNOSIS — M6281 Muscle weakness (generalized): Secondary | ICD-10-CM | POA: Diagnosis present

## 2024-07-15 DIAGNOSIS — R293 Abnormal posture: Secondary | ICD-10-CM | POA: Diagnosis present

## 2024-07-15 DIAGNOSIS — R2689 Other abnormalities of gait and mobility: Secondary | ICD-10-CM | POA: Diagnosis present

## 2024-07-15 DIAGNOSIS — N39498 Other specified urinary incontinence: Secondary | ICD-10-CM | POA: Diagnosis present
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
## 2024-07-22 ENCOUNTER — Ambulatory Visit: Payer: Medicare (Managed Care)

## 2024-07-23 ENCOUNTER — Emergency Department
Admission: EM | Admit: 2024-07-23 | Discharge: 2024-07-23 | Disposition: A | Discharge status: Home / Self Care | Payer: Medicare (Managed Care)

## 2024-07-23 ENCOUNTER — Other Ambulatory Visit: Payer: Self-pay

## 2024-07-23 ENCOUNTER — Emergency Department: Payer: Medicare (Managed Care)

## 2024-07-23 DIAGNOSIS — R079 Chest pain, unspecified: Secondary | ICD-10-CM | POA: Insufficient documentation

## 2024-07-23 DIAGNOSIS — I10 Essential (primary) hypertension: Secondary | ICD-10-CM | POA: Insufficient documentation

## 2024-07-23 LAB — HEPATIC FUNCTION PANEL
ALT: 25 U/L (ref 0–44)
AST: 27 U/L (ref 15–41)
Albumin: 4.2 g/dL (ref 3.5–5.0)
Alkaline Phosphatase: 86 U/L (ref 38–126)
Bilirubin, Direct: 0.1 mg/dL (ref 0.0–0.2)
Indirect Bilirubin: 0.2 mg/dL — ABNORMAL LOW (ref 0.3–0.9)
Total Bilirubin: 0.3 mg/dL (ref 0.0–1.2)
Total Protein: 7.5 g/dL (ref 6.5–8.1)

## 2024-07-23 LAB — CBC
HCT: 39.4 % (ref 36.0–46.0)
Hemoglobin: 12.8 g/dL (ref 12.0–15.0)
MCH: 31.5 pg (ref 26.0–34.0)
MCHC: 32.5 g/dL (ref 30.0–36.0)
MCV: 97 fL (ref 80.0–100.0)
Platelets: 143 K/uL — ABNORMAL LOW (ref 150–400)
RBC: 4.06 MIL/uL (ref 3.87–5.11)
RDW: 12.5 % (ref 11.5–15.5)
WBC: 6.9 K/uL (ref 4.0–10.5)
nRBC: 0 % (ref 0.0–0.2)

## 2024-07-23 LAB — BASIC METABOLIC PANEL WITH GFR
Anion gap: 11 (ref 5–15)
BUN: 14 mg/dL (ref 6–20)
CO2: 28 mmol/L (ref 22–32)
Calcium: 9.3 mg/dL (ref 8.9–10.3)
Chloride: 101 mmol/L (ref 98–111)
Creatinine, Ser: 0.94 mg/dL (ref 0.44–1.00)
GFR, Estimated: >60 mL/min (ref >60)
Glucose, Bld: 75 mg/dL (ref 70–99)
Potassium: 3.8 mmol/L (ref 3.5–5.1)
Sodium: 140 mmol/L (ref 135–145)

## 2024-07-23 LAB — D-DIMER, QUANTITATIVE: D-Dimer, Quant: 0.51 ug{FEU}/mL — ABNORMAL HIGH (ref 0.00–0.50)

## 2024-07-23 LAB — LIPASE, BLOOD: Lipase: 22 U/L (ref 11–51)

## 2024-07-23 LAB — TROPONIN T, HIGH SENSITIVITY
Troponin T High Sensitivity: <15 ng/L (ref 0–19)
Troponin T High Sensitivity: <15 ng/L (ref 0–19)

## 2024-07-23 MED ORDER — LABETALOL HCL 200 MG PO TABS
200.0000 mg | ORAL_TABLET | Freq: Once | ORAL | Status: AC
  Administered 2024-07-23: 200 mg via ORAL
  Filled 2024-07-23: qty 1

## 2024-07-23 MED ORDER — FAMOTIDINE 20 MG PO TABS
20.0000 mg | ORAL_TABLET | Freq: Once | ORAL | Status: AC
  Administered 2024-07-23: 20 mg via ORAL
  Filled 2024-07-23: qty 1

## 2024-07-23 MED ORDER — ALUM & MAG HYDROXIDE-SIMETH 200-200-20 MG/5ML PO SUSP
30.0000 mL | Freq: Once | ORAL | Status: AC
  Administered 2024-07-23: 30 mL via ORAL
  Filled 2024-07-23: qty 30

## 2024-07-23 MED ORDER — ACETAMINOPHEN 500 MG PO TABS
1000.0000 mg | ORAL_TABLET | Freq: Once | ORAL | Status: AC
  Administered 2024-07-23: 1000 mg via ORAL
  Filled 2024-07-23: qty 2

## 2024-07-23 MED ORDER — ACETAMINOPHEN 500 MG PO TABS: 1000.0000 mg | ORAL_TABLET | Freq: Four times a day (QID) | ORAL | 2 refills | Status: AC | PRN

## 2024-07-23 MED ORDER — LIDOCAINE VISCOUS HCL 2 % MT SOLN
15.0000 mL | Freq: Once | OROMUCOSAL | Status: AC
  Administered 2024-07-23: 15 mL via ORAL
  Filled 2024-07-23: qty 15

## 2024-07-23 MED ORDER — FAMOTIDINE 20 MG PO TABS: 20.0000 mg | ORAL_TABLET | Freq: Two times a day (BID) | ORAL | 0 refills | Status: AC

## 2024-07-23 NOTE — ED Triage Notes (Signed)
 Pt c/o chest pain that started over the weekend during her daily walk. Pt reports pain radiates to L shoulder at times. Pt denies any other accompanying sx. Pt was told to come in by her psychiatrist to get checked out. Pt denies personal cardiac hx but endorses father and grandmother with cardiac diagnoses.

## 2024-07-23 NOTE — ED Provider Notes (Signed)
 [facility] Provider Note    Event Date/Time   First MD Initiated Contact with Patient 07/23/24 1654     (approximate)   History   Chest Pain  Pt c/o chest pain that started over the weekend during her daily walk. Pt reports pain radiates to L shoulder at times. Pt denies any other accompanying sx. Pt was told to come in by her psychiatrist to get checked out. Pt denies personal cardiac hx but endorses father and grandmother with cardiac diagnoses.    HPI Mary Henson is a 58 y.o. female PMH hypertension, bipolar disorder presents for evaluation of chest pain - Patient says she has been having some intermittent substernal sharp chest pains over the weekend.  Not clearly exertional.  Lasts only a few seconds at a time.  Does have a mild pressure sensation that radiates to her left arm. - Notes her grandmother had a heart attack in her 64s, no other known family history of MI.  Father had heart failure. - Non-smoker - No history of DVT/PE, no recent surgery/recent stress travel.  Notes some chronic bilateral lower extremity edema for which she takes Lasix.  No hormone use.  No pleuritic discomfort.  No shortness of breath. - No abdominal pain, syncope or near syncope, dizziness, diaphoresis       Physical Exam   Triage Vital Signs: ED Triage Vitals  Encounter Vitals Group     BP 07/23/24 1621 (!) 213/115     Girls Systolic BP Percentile --      Girls Diastolic BP Percentile --      Boys Systolic BP Percentile --      Boys Diastolic BP Percentile --      Pulse Rate 07/23/24 1621 74     Resp 07/23/24 1621 18     Temp 07/23/24 1621 98.1 F (36.7 C)     Temp Source 07/23/24 1621 Oral     SpO2 07/23/24 1621 96 %     Weight 07/23/24 1622 209 lb (94.8 kg)     Height 07/23/24 1622 5' 4 (1.626 m)     Head Circumference --      Peak Flow --      Pain Score 07/23/24 1621 1     Pain Loc --      Pain Education --      Exclude from Growth Chart --      Most recent vital signs: Vitals:   07/23/24 1745 07/23/24 2010  BP: (!) 147/112 (!) 169/89  Pulse: 66 69  Resp:  16  Temp:    SpO2:  100%     General: Awake, no distress.  CV:  Good peripheral perfusion. RRR, RP 2+ Resp:  Normal effort. CTAB Abd:  No distention. Nontender to deep palpation throughout    ED Results / Procedures / Treatments   Labs (all labs ordered are listed, but only abnormal results are displayed) Labs Reviewed  CBC - Abnormal; Notable for the following components:      Result Value   Platelets 143 (*)    All other components within normal limits  HEPATIC FUNCTION PANEL - Abnormal; Notable for the following components:   Indirect Bilirubin 0.2 (*)    All other components within normal limits  D-DIMER, QUANTITATIVE - Abnormal; Notable for the following components:   D-Dimer, Quant 0.51 (*)    All other components within normal limits  BASIC METABOLIC PANEL WITH GFR  LIPASE, BLOOD  TROPONIN T, HIGH SENSITIVITY  TROPONIN T, HIGH SENSITIVITY     EKG  See ED course below.   RADIOLOGY Radiology interpreted by myself and radiology report reviewed.  No acute pathology identified.    PROCEDURES:  Critical Care performed: No  Procedures   MEDICATIONS ORDERED IN ED: Medications  labetalol  (NORMODYNE ) tablet 200 mg (200 mg Oral Given 07/23/24 1745)  acetaminophen  (TYLENOL ) tablet 1,000 mg (1,000 mg Oral Given 07/23/24 1745)  alum & mag hydroxide-simeth (MAALOX/MYLANTA) 200-200-20 MG/5ML suspension 30 mL (30 mLs Oral Given 07/23/24 1746)    And  lidocaine  (XYLOCAINE ) 2 % viscous mouth solution 15 mL (15 mLs Oral Given 07/23/24 1746)  famotidine  (PEPCID ) tablet 20 mg (20 mg Oral Given 07/23/24 1746)     IMPRESSION / MDM / ASSESSMENT AND PLAN / ED COURSE  I reviewed the triage vital signs and the nursing notes.                              DDX/MDM/AP: Differential diagnosis includes, but is not limited to, ACS, PE, doubt pneumothorax,  clinically doubt aortic dissection, consider dyspepsia, considered but doubt pancreatitis, cholecystitis, other upper abdominal pathology.  Consider possibility of hypertensive emergency--systolic in the 200s on arrival, was 170/105 during my evaluation.  Plan: - Labs - EKG - Chest x-Preston - GI cocktail, Tylenol  -Will give her evening dose of oral labetalol  - Reassess  Patient's presentation is most consistent with acute presentation with potential threat to life or bodily function.  The patient is on the cardiac monitor to evaluate for evidence of arrhythmia and/or significant heart rate changes.  ED course below.  Workup unremarkable.  Feeling better after GI cocktail.  Blood pressure and improved with her normal home regimen, did not require IV intervention.  No evidence of endorgan damage, not consistent with hypertensive emergency.  D-dimer normal when adjusted for age and no other clinical signs or symptoms of DVT/PE and no obvious risk factors,-shared decision making she is comfortable deferring CTA and following up with her cardiologist and PMD.  Rx famotidine , Tylenol  for possible dyspepsia as etiology.  ED return precautions in place.  Patient agrees with plan.  Clinical Course as of 07/23/24 2021  Tue Jul 23, 2024  1653 Ecg = sinus rhythm, rate 63, no gross ST elevation or depression, no significant repolarization abnormality, normal axis, normal intervals.  No clear evidence of ischemia no arrhythmia on my interpretation. [MM]  1711 Cbc wnl [MM]  1711 CXR: IMPRESSION: No active cardiopulmonary disease.   [MM]  1712 Ecg = sinus rhythm, rate 63, no gross ST elevation or depression, no significant repolarization abnormality, normal axis, normal intervals.  No clear evidence of ischemia nor arrhythmia on my interpretation. [MM]  1722 Trop wnl Bmp wnl [MM]  1904 Patient does have D-dimer elevation at baseline, age-adjusted D-dimer today is normal  Remains with no tachycardia,  tachypnea, hypoxia, hypotension [MM]  2015 Patient reevaluated, feeling better after GI cocktail.  Repeat blood pressure 169/89 after her oral labetalol  home dose.  Serial troponins negative, doubt cardiac etiology at this time.  D-dimer normal when adjusted for age and no risk factors--in shared decision making, patient is comfortable deferring CTA at this time and following up with her primary care provider and cardiologist which I believe is very reasonable.  Consider dyspepsia possibly contributing, will Rx short course of famotidine .  ED return precautions in place.  Patient agrees with plan. [MM]    Clinical Course User Index [MM]  Clarine Ozell LABOR, MD     FINAL CLINICAL IMPRESSION(S) / ED DIAGNOSES   Final diagnoses:  Nonspecific chest pain  Hypertension, unspecified type     Rx / DC Orders   ED Discharge Orders          Ordered    famotidine  (PEPCID ) 20 MG tablet  2 times daily        07/23/24 2020    acetaminophen  (TYLENOL ) 500 MG tablet  Every 6 hours PRN        07/23/24 2020             Note:  This document was prepared using Dragon voice recognition software and may include unintentional dictation errors.   Clarine Ozell LABOR, MD 07/23/24 2021

## 2024-07-23 NOTE — Discharge Instructions (Addendum)
 Patient in the emergency department was overall reassuring.  As discussed, your blood pressure was elevated here, but it did improve with your usual blood pressure regimen --please continue this at home.  Your symptoms did improve with some antacid medications here, and I prescribed you an antacid and Tylenol  to use as needed for any ongoing mild discomfort.  Please do follow-up with your primary care provider and cardiologist as discussed for reevaluation, and return to the emergency department with any new or worsening symptoms.

## 2024-07-23 NOTE — ED Notes (Signed)
 Called CCMD to initiate cardiac monitoring.

## 2024-07-25 ENCOUNTER — Ambulatory Visit: Payer: Medicare (Managed Care)

## 2024-08-08 ENCOUNTER — Ambulatory Visit: Payer: Medicare (Managed Care)

## 2024-08-12 ENCOUNTER — Ambulatory Visit: Payer: Medicare (Managed Care)

## 2024-08-29 ENCOUNTER — Encounter: Payer: Self-pay | Admitting: Gastroenterology

## 2024-08-29 ENCOUNTER — Ambulatory Visit
Admission: RE | Admit: 2024-08-29 | Discharge: 2024-08-29 | Disposition: A | Discharge status: Home / Self Care | Payer: Medicare (Managed Care) | Attending: Gastroenterology | Admitting: Gastroenterology

## 2024-08-29 ENCOUNTER — Ambulatory Visit: Admitting: Anesthesiology

## 2024-08-29 ENCOUNTER — Encounter
Admission: RE | Disposition: A | Discharge status: Home / Self Care | Payer: Self-pay | Source: Home / Self Care | Attending: Gastroenterology

## 2024-08-29 ENCOUNTER — Other Ambulatory Visit: Payer: Self-pay

## 2024-08-29 DIAGNOSIS — K644 Residual hemorrhoidal skin tags: Secondary | ICD-10-CM | POA: Diagnosis not present

## 2024-08-29 DIAGNOSIS — R194 Change in bowel habit: Secondary | ICD-10-CM | POA: Insufficient documentation

## 2024-08-29 DIAGNOSIS — G4730 Sleep apnea, unspecified: Secondary | ICD-10-CM | POA: Diagnosis not present

## 2024-08-29 DIAGNOSIS — R1030 Lower abdominal pain, unspecified: Secondary | ICD-10-CM | POA: Insufficient documentation

## 2024-08-29 DIAGNOSIS — I10 Essential (primary) hypertension: Secondary | ICD-10-CM | POA: Diagnosis not present

## 2024-08-29 HISTORY — PX: COLONOSCOPY: SHX5424

## 2024-08-29 MED ORDER — LIDOCAINE HCL (CARDIAC) PF 100 MG/5ML IV SOSY
PREFILLED_SYRINGE | INTRAVENOUS | Status: DC | PRN
  Administered 2024-08-29: 80 mg via INTRAVENOUS

## 2024-08-29 MED ORDER — PROPOFOL 500 MG/50ML IV EMUL
INTRAVENOUS | Status: DC | PRN
  Administered 2024-08-29: 75 ug/kg/min via INTRAVENOUS

## 2024-08-29 MED ORDER — PROPOFOL 10 MG/ML IV BOLUS
INTRAVENOUS | Status: DC | PRN
  Administered 2024-08-29: 30 mg via INTRAVENOUS
  Administered 2024-08-29: 50 mg via INTRAVENOUS

## 2024-08-29 MED ORDER — SODIUM CHLORIDE 0.9 % IV SOLN: INTRAVENOUS | Status: DC

## 2024-08-29 MED ORDER — DEXMEDETOMIDINE HCL IN NACL 80 MCG/20ML IV SOLN
INTRAVENOUS | Status: DC | PRN
  Administered 2024-08-29: 12 ug via INTRAVENOUS
  Administered 2024-08-29: 8 ug via INTRAVENOUS

## 2024-08-29 NOTE — Op Note (Signed)
 [facility]ology Patient Name: Mary Henson Procedure Date: 08/29/2024 9:28 AM MRN: [id number] Account #: [id number] Date of Birth: 09/25/1965 Admit Type: Outpatient Age: 59 Room: [facility] ENDO ROOM 3 Gender: Female Note Status: Finalized Instrument Name: Colon Scope [id number] Procedure:             Colonoscopy Indications:           Lower abdominal pain, Change in bowel habits Providers:             Corinn Jess Brooklyn MD, MD Referring MD:          Corinn Jess Brooklyn MD, MD (Referring MD) Medicines:             General Anesthesia Complications:         No immediate complications. Estimated blood loss: None. Procedure:             Pre-Anesthesia Assessment:                        - Prior to the procedure, a History and Physical was                         performed, and patient medications and allergies were                         reviewed. The patient is competent. The risks and                         benefits of the procedure and the sedation options and                         risks were discussed with the patient. All questions                         were answered and informed consent was obtained.                         Patient identification and proposed procedure were                         verified by the physician, the nurse, the                         anesthesiologist, the anesthetist and the technician                         in the pre-procedure area in the procedure room in the                         endoscopy suite. Mental Status Examination: alert and                         oriented. Airway Examination: normal oropharyngeal                         airway and neck mobility. Respiratory Examination:                         clear to auscultation. CV Examination: normal.  Prophylactic Antibiotics: The patient does not require                         prophylactic antibiotics. Prior Anticoagulants: The                          patient has taken no anticoagulant or antiplatelet                         agents. ASA Grade Assessment: II - A patient with mild                         systemic disease. After reviewing the risks and                         benefits, the patient was deemed in satisfactory                         condition to undergo the procedure. The anesthesia                         plan was to use general anesthesia. Immediately prior                         to administration of medications, the patient was                         re-assessed for adequacy to receive sedatives. The                         heart rate, respiratory rate, oxygen saturations,                         blood pressure, adequacy of pulmonary ventilation, and                         response to care were monitored throughout the                         procedure. The physical status of the patient was                         re-assessed after the procedure.                        After obtaining informed consent, the colonoscope was                         passed under direct vision. Throughout the procedure,                         the patient's blood pressure, pulse, and oxygen                         saturations were monitored continuously. The                         Colonoscope was introduced through the anus and  advanced to the the cecum, identified by appendiceal                         orifice and ileocecal valve. The colonoscopy was                         performed without difficulty. The patient tolerated                         the procedure well. The quality of the bowel                         preparation was evaluated using the BBPS [facility] Bowel                         Preparation Scale) with scores of: Right Colon = 3,                         Transverse Colon = 3 and Left Colon = 3 (entire mucosa                         seen well with no residual staining, small fragments                          of stool or opaque liquid). The total BBPS score                         equals 9. The ileocecal valve, appendiceal orifice,                         and rectum were photographed. Findings:      The perianal and digital rectal examinations were normal. Pertinent       negatives include normal sphincter tone and no palpable rectal lesions.      The entire examined colon appeared normal.      Non-bleeding external hemorrhoids were found during retroflexion. The       hemorrhoids were small. Impression:            - The entire examined colon is normal.                        - Non-bleeding external hemorrhoids.                        - No specimens collected. Recommendation:        - Repeat colonoscopy in 10 years for screening                         purposes.                        - Discharge patient to home (with escort).                        - Resume previous diet today.                        - Continue present medications. Procedure Code(s):     --- Professional ---  54621, Colonoscopy, flexible; diagnostic, including                         collection of specimen(s) by brushing or washing, when                         performed (separate procedure) Diagnosis Code(s):     --- Professional ---                        K64.4, Residual hemorrhoidal skin tags                        R10.30, Lower abdominal pain, unspecified                        R19.4, Change in bowel habit CPT copyright 2022 American Medical Association. All rights reserved. The codes documented in this report are preliminary and upon coder review may  be revised to meet current compliance requirements. Dr. Corinn Brooklyn Corinn Jess Brooklyn MD, MD 08/29/2024 9:56:27 AM This report has been signed electronically. Number of Addenda: 0 Note Initiated On: 08/29/2024 9:28 AM Scope Withdrawal Time: 0 hours 10 minutes 47 seconds  Total Procedure Duration: 0 hours 16 minutes 28 seconds  Estimated  Blood Loss:  Estimated blood loss: none.      [facility]

## 2024-08-29 NOTE — Transfer of Care (Signed)
 Immediate Anesthesia Transfer of Care Note  Patient: Mary Henson  Procedure(s) Performed: COLONOSCOPY  Patient Location: PACU  Anesthesia Type:General  Level of Consciousness: sedated  Airway & Oxygen Therapy: Patient Spontanous Breathing  Post-op Assessment: Report given to RN and Post -op Vital signs reviewed and stable  Post vital signs: Reviewed and stable  Last Vitals:  Vitals Value Taken Time  BP    Temp    Pulse 73 08/29/24 09:58  Resp 17 08/29/24 09:58  SpO2 100 % 08/29/24 09:58  Vitals shown include unfiled device data.  Last Pain:  Vitals:   08/29/24 0859  TempSrc: Temporal  PainSc: 0-No pain         Complications: No notable events documented.

## 2024-08-29 NOTE — Anesthesia Preprocedure Evaluation (Signed)
"                                    Anesthesia Evaluation  Patient identified by MRN, date of birth, ID band Patient awake    Reviewed: Allergy & Precautions, NPO status , Patient's Chart, lab work & pertinent test results  Airway Mallampati: III  TM Distance: >3 FB Neck ROM: full    Dental no notable dental hx.    Pulmonary sleep apnea    Pulmonary exam normal        Cardiovascular hypertension, Normal cardiovascular exam     Neuro/Psych  PSYCHIATRIC DISORDERS   Bipolar Disorder   negative neurological ROS     GI/Hepatic negative GI ROS, Neg liver ROS,,,  Endo/Other  negative endocrine ROS    Renal/GU      Musculoskeletal   Abdominal   Peds  Hematology negative hematology ROS (+)   Anesthesia Other Findings Past Medical History: No date: Anemia No date: Bipolar disorder (HCC) No date: Hypertension No date: Sleep apnea  Past Surgical History: No date: MYOMECTOMY No date: UTERINE FIBROID EMBOLIZATION     Reproductive/Obstetrics negative OB ROS                              Anesthesia Physical Anesthesia Plan  ASA: 2  Anesthesia Plan: General   Post-op Pain Management: Minimal or no pain anticipated   Induction: Intravenous  PONV Risk Score and Plan: 2 and Propofol  infusion and TIVA  Airway Management Planned: Nasal Cannula  Additional Equipment: None  Intra-op Plan:   Post-operative Plan:   Informed Consent: I have reviewed the patients History and Physical, chart, labs and discussed the procedure including the risks, benefits and alternatives for the proposed anesthesia with the patient or authorized representative who has indicated his/her understanding and acceptance.     Dental advisory given  Plan Discussed with: CRNA and Surgeon  Anesthesia Plan Comments: (Discussed risks of anesthesia with patient, including possibility of difficulty with spontaneous ventilation under anesthesia  necessitating airway intervention, PONV, and rare risks such as cardiac or respiratory or neurological events, and allergic reactions. Discussed the role of CRNA in patient's perioperative care. Patient understands.)        Anesthesia Quick Evaluation  "

## 2024-08-29 NOTE — H&P (Signed)
 "   Corinn JONELLE Brooklyn, MD [facility] Gastroenter[facility], [facility] [address]  [place], [place] [address]  Main: [phone] Fax:  [phone] Pager: [phone]   Primary Care Physician:  Alla Amis, MD Primary Gastroenterologist:  Dr. Corinn JONELLE Brooklyn  Pre-Procedure History & Physical: HPI:  Mary Henson is a 59 y.o. female is here for an colonoscopy.   Past Medical History:  Diagnosis Date   Anemia    Bipolar disorder (HCC)    Hypertension    Sleep apnea     Past Surgical History:  Procedure Laterality Date   COLONOSCOPY     DILATATION & CURETTAGE/HYSTEROSCOPY WITH MYOSURE N/A 06/17/2022   Procedure: DILATATION & CURETTAGE/HYSTEROSCOPY WITH POSSIBLE POLYPECTOMY;  Surgeon: Verdon Keen, MD;  Location: [facility] ORS;  Service: Gynecology;  Laterality: N/A;   MYOMECTOMY     UTERINE FIBROID EMBOLIZATION      Prior to Admission medications  Medication Sig Start Date End Date Taking? Authorizing Provider  acetaminophen  (TYLENOL ) 500 MG tablet Take 2 tablets (1,000 mg total) by mouth every 6 (six) hours as needed. 12/25/23 12/24/24  Clarine Ozell LABOR, MD  acetaminophen  (TYLENOL ) 500 MG tablet Take 2 tablets (1,000 mg total) by mouth every 6 (six) hours as needed. 07/23/24 07/23/25  Clarine Ozell LABOR, MD  albuterol  (VENTOLIN  HFA) 108 [phone] Base) MCG/ACT inhaler Inhale 2 puffs into the lungs every 6 (six) hours as needed for wheezing or shortness of breath. Patient not taking: Reported on 07/01/2024 05/06/23   Lang Dover, MD  benztropine (COGENTIN) 1 MG tablet Take 1 mg by mouth 2 (two) times daily. Patient not taking: Reported on 07/01/2024    [provider]  divalproex (DEPAKOTE) 500 MG DR tablet Take 1,500 mg by mouth at bedtime.    [provider]  famotidine  (PEPCID ) 20 MG tablet Take 1 tablet (20 mg total) by mouth 2 (two) times daily for 14 days. 07/23/24 08/06/24  Clarine Ozell LABOR, MD  Ferrous Gluconate (IRON 27 PO) Take 1 tablet by mouth daily.     [provider]  furosemide (LASIX) 40 MG tablet Take 40 mg by mouth daily.    [provider]  hydrOXYzine (ATARAX) 25 MG tablet Take 25-50 mg by mouth every 8 (eight) hours as needed for anxiety.    [provider]  LABETALOL  HCL PO Take 200 mg by mouth 2 (two) times daily.    [provider]  lisinopril  (ZESTRIL ) 30 MG tablet Take 30 mg by mouth daily.    [provider]  metFORMIN (GLUCOPHAGE) 500 MG tablet Take 500 mg by mouth 2 (two) times daily with a meal. Patient not taking: Reported on 07/01/2024    [provider]  NORETHINDRONE PO Take 0.35 mg by mouth daily. Patient not taking: Reported on 07/01/2024    [provider]  OLANZapine  (ZYPREXA ) 20 MG tablet Take 20 mg by mouth at bedtime.    [provider]  Vibegron  (GEMTESA ) 75 MG TABS Take 1 tablet (75 mg total) by mouth daily. 07/01/24 06/26/25  Gaston Hamilton, MD    Allergies as of 08/01/2024 - Review Complete 07/23/2024  Allergen Reaction Noted   Lamotrigine Rash and Swelling 07/06/2011   Penicillins Swelling 07/06/2011   Clindamycin/lincomycin Rash 07/06/2011   Hydrochlorothiazide Other (See Comments) and Rash 04/19/2016   Other Rash 10/16/2013   Amiloride hydrochloride dihydrate [amiloride]  11/09/2019    Family History  Problem Relation Age of Onset   Ovarian cancer Other  MGAunt    Social History   Socioeconomic History   Marital status: Single    Spouse name: Not on file   Number of children: 0   Years of education: Not on file   Highest education level: Not on file  Occupational History   Not on file  Tobacco Use   Smoking status: Never   Smokeless tobacco: Never  Vaping Use   Vaping status: Never Used  Substance and Sexual Activity   Alcohol use: Never   Drug use: Never   Sexual activity: Never  Other Topics Concern   Not on file  Social History Narrative   Not on file   Social Drivers of Health   Tobacco Use:  Low Risk (08/29/2024)   Patient History    Smoking Tobacco Use: Never    Smokeless Tobacco Use: Never    Passive Exposure: Not on file  Financial Resource Strain: Low Risk  (08/01/2024)   Received from [facility]   Overall Financial Resource Strain (CARDIA)    Difficulty of Paying Living Expenses: Not hard at all  Food Insecurity: No Food Insecurity (08/01/2024)   Received from [facility]   Epic    Within the past 12 months, you worried that your food would run out before you got the money to buy more.: Never true    Within the past 12 months, the food you bought just didn't last and you didn't have money to get more.: Never true  Transportation Needs: No Transportation Needs (08/01/2024)   Received from [facility] - Transportation    In the past 12 months, has lack of transportation kept you from medical appointments or from getting medications?: No    Lack of Transportation (Non-Medical): No  Physical Activity: Not on file  Stress: Not on file  Social Connections: Unknown (01/03/2022)   Received from [facility]   Social Network    Social Network: Not on file  Intimate Partner Violence: Unknown (11/25/2021)   Received from [facility]   HITS    Physically Hurt: Not on file    Insult or Talk Down To: Not on file    Threaten Physical Harm: Not on file    Scream or Curse: Not on file  Depression (EYV7-0): Not on file  Alcohol Screen: Not on file  Housing: Low Risk  (08/01/2024)   Received from [facility]   Epic    In the last 12 months, was there a time when you were not able to pay the mortgage or rent on time?: No    In the past 12 months, how many times have you moved where you were living?: 0    At any time in the past 12 months, were you homeless or living in a shelter (including now)?: No  Utilities: Not At Risk (08/01/2024)   Received from [facility]   Epic    In  the past 12 months has the electric, gas, oil, or water company threatened to shut off services in your home?: No  Health Literacy: Not on file    Review of Systems: See HPI, otherwise negative ROS  Physical Exam: BP (!) 185/95   Pulse 78   Temp (!) 97.5 F (36.4 C) (Temporal)   Resp 16   Ht 5' 4 (1.626 m)   Wt 94.2 kg   SpO2 96%   BMI 35.63 kg/m  General:   Alert,  pleasant  and cooperative in NAD Head:  Normocephalic and atraumatic. Neck:  Supple; no masses or thyromegaly. Lungs:  Clear throughout to auscultation.    Heart:  Regular rate and rhythm. Abdomen:  Soft, nontender and nondistended. Normal bowel sounds, without guarding, and without rebound.   Neurologic:  Alert and  oriented x4;  grossly normal neurologically.  Impression/Plan: Mary Henson is here for a colonoscopy to be performed for Change in bowel habits and leakage   Risks, benefits, limitations, and alternatives regarding  colonoscopy have been reviewed with the patient.  Questions have been answered.  All parties agreeable.   Corinn Brooklyn, MD  08/29/2024, 9:34 AM "

## 2024-08-29 NOTE — Anesthesia Postprocedure Evaluation (Signed)
"   Anesthesia Post Note  Patient: Mary Henson  Procedure(s) Performed: COLONOSCOPY  Patient location during evaluation: PACU Anesthesia Type: General Level of consciousness: awake and alert Pain management: pain level controlled Vital Signs Assessment: post-procedure vital signs reviewed and stable Respiratory status: spontaneous breathing, nonlabored ventilation, respiratory function stable and patient connected to nasal cannula oxygen Cardiovascular status: blood pressure returned to baseline and stable Postop Assessment: no apparent nausea or vomiting Anesthetic complications: no   No notable events documented.   Last Vitals:  Vitals:   08/29/24 1007 08/29/24 1017  BP: 126/75 (!) 152/81  Pulse: 83 73  Resp: (!) 22 19  Temp:    SpO2: 98% 98%    Last Pain:  Vitals:   08/29/24 1017  TempSrc:   PainSc: 0-No pain                 Debby Mines      "

## 2024-09-05 ENCOUNTER — Other Ambulatory Visit: Payer: Self-pay

## 2024-09-05 ENCOUNTER — Ambulatory Visit: Payer: Medicare (Managed Care) | Attending: Urology

## 2024-09-05 DIAGNOSIS — R2689 Other abnormalities of gait and mobility: Secondary | ICD-10-CM | POA: Diagnosis present

## 2024-09-05 DIAGNOSIS — N39498 Other specified urinary incontinence: Secondary | ICD-10-CM | POA: Diagnosis present

## 2024-09-05 DIAGNOSIS — R293 Abnormal posture: Secondary | ICD-10-CM | POA: Insufficient documentation

## 2024-09-05 DIAGNOSIS — M6281 Muscle weakness (generalized): Secondary | ICD-10-CM | POA: Diagnosis present
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
## 2025-04-21 ENCOUNTER — Ambulatory Visit: Payer: Medicare (Managed Care) | Admitting: Urology

## 2025-06-30 ENCOUNTER — Ambulatory Visit: Payer: Medicare (Managed Care) | Admitting: Urology
